# Patient Record
Sex: Female | Born: 1960 | Race: White | Hispanic: No | Smoking: Former smoker
Health system: Southern US, Community
[De-identification: ages and names within clinical notes are randomized; demographics above are authoritative.]

## PROBLEM LIST (undated history)

## (undated) DIAGNOSIS — J45909 Unspecified asthma, uncomplicated: Secondary | ICD-10-CM

## (undated) DIAGNOSIS — I10 Essential (primary) hypertension: Secondary | ICD-10-CM

## (undated) DIAGNOSIS — R7303 Prediabetes: Secondary | ICD-10-CM

## (undated) DIAGNOSIS — H269 Unspecified cataract: Secondary | ICD-10-CM

## (undated) DIAGNOSIS — E785 Hyperlipidemia, unspecified: Secondary | ICD-10-CM

## (undated) DIAGNOSIS — T7840XA Allergy, unspecified, initial encounter: Secondary | ICD-10-CM

## (undated) DIAGNOSIS — M1990 Unspecified osteoarthritis, unspecified site: Secondary | ICD-10-CM

## (undated) HISTORY — DX: Unspecified cataract: H26.9

## (undated) HISTORY — DX: Allergy, unspecified, initial encounter: T78.40XA

## (undated) HISTORY — PX: SMALL INTESTINE SURGERY: SHX150

## (undated) HISTORY — PX: TONSILLECTOMY: SUR1361

## (undated) HISTORY — PX: EYE SURGERY: SHX253

## (undated) HISTORY — DX: Unspecified osteoarthritis, unspecified site: M19.90

## (undated) HISTORY — PX: BREAST EXCISIONAL BIOPSY: SUR124

## (undated) HISTORY — PX: BREAST BIOPSY: SHX20

---
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
## 2000-07-03 HISTORY — PX: BREAST SURGERY: SHX581

## 2004-08-01 ENCOUNTER — Ambulatory Visit: Payer: Self-pay | Admitting: Family Medicine

## 2005-01-12 ENCOUNTER — Ambulatory Visit: Payer: Self-pay | Admitting: Family Medicine

## 2005-11-16 ENCOUNTER — Ambulatory Visit: Payer: Self-pay | Admitting: Family Medicine

## 2006-09-14 DIAGNOSIS — M25569 Pain in unspecified knee: Secondary | ICD-10-CM | POA: Insufficient documentation

## 2006-09-14 DIAGNOSIS — I10 Essential (primary) hypertension: Secondary | ICD-10-CM | POA: Insufficient documentation

## 2006-09-14 DIAGNOSIS — J45909 Unspecified asthma, uncomplicated: Secondary | ICD-10-CM | POA: Insufficient documentation

## 2007-10-02 ENCOUNTER — Ambulatory Visit: Payer: Self-pay | Admitting: Family Medicine

## 2008-06-19 DIAGNOSIS — B009 Herpesviral infection, unspecified: Secondary | ICD-10-CM | POA: Insufficient documentation

## 2008-12-09 DIAGNOSIS — F17201 Nicotine dependence, unspecified, in remission: Secondary | ICD-10-CM | POA: Insufficient documentation

## 2008-12-24 ENCOUNTER — Ambulatory Visit: Payer: Self-pay | Admitting: Family Medicine

## 2010-02-21 ENCOUNTER — Inpatient Hospital Stay: Payer: Self-pay | Admitting: Surgery

## 2010-02-21 HISTORY — PX: HERNIA REPAIR: SHX51

## 2010-02-23 LAB — PATHOLOGY REPORT

## 2010-03-18 ENCOUNTER — Ambulatory Visit: Payer: Self-pay | Admitting: Family Medicine

## 2010-07-22 ENCOUNTER — Inpatient Hospital Stay: Payer: Self-pay | Admitting: Surgery

## 2011-02-03 LAB — CBC AND DIFFERENTIAL
HEMATOCRIT: 44 % (ref 36–46)
HEMOGLOBIN: 15 g/dL (ref 12.0–16.0)
Platelets: 292 10*3/uL (ref 150–399)
WBC: 10.8 10^3/mL

## 2011-03-21 ENCOUNTER — Ambulatory Visit: Payer: Self-pay | Admitting: Family Medicine

## 2012-05-10 LAB — BASIC METABOLIC PANEL
BUN: 17 mg/dL (ref 4–21)
Potassium: 4.6 mmol/L (ref 3.4–5.3)
SODIUM: 142 mmol/L (ref 137–147)

## 2012-05-10 LAB — HEPATIC FUNCTION PANEL
ALT: 21 U/L (ref 7–35)
AST: 22 U/L (ref 13–35)

## 2012-05-10 LAB — TSH: TSH: 1.87 u[IU]/mL (ref 0.41–5.90)

## 2012-05-11 LAB — HM HEPATITIS C SCREENING LAB: HM HEPATITIS C SCREENING: NEGATIVE

## 2012-06-21 ENCOUNTER — Ambulatory Visit: Payer: Self-pay | Admitting: Family Medicine

## 2013-01-11 IMAGING — MG MM CAD SCREENING MAMMO
1 series · 4 of 4 positions shown · non-contrast
Comparison: none

REASON FOR EXAM: SCR MAMMO NO ORDER
COMMENTS:

PROCEDURE:     MAM - MAM DGTL SCRN MAM NO ORDER W/CAD  - June 21, 2012 [DATE]
RESULT:     Bilateral digital screening mammogram with CAD dated 06/21/2012
compares made to prior studies dated 07/15/2002  10/02/2007 12/24/2008 03/18/2010
and and 03/21/2011

[R CC · right · 4 of 4 slices shown]
[im 1/4]
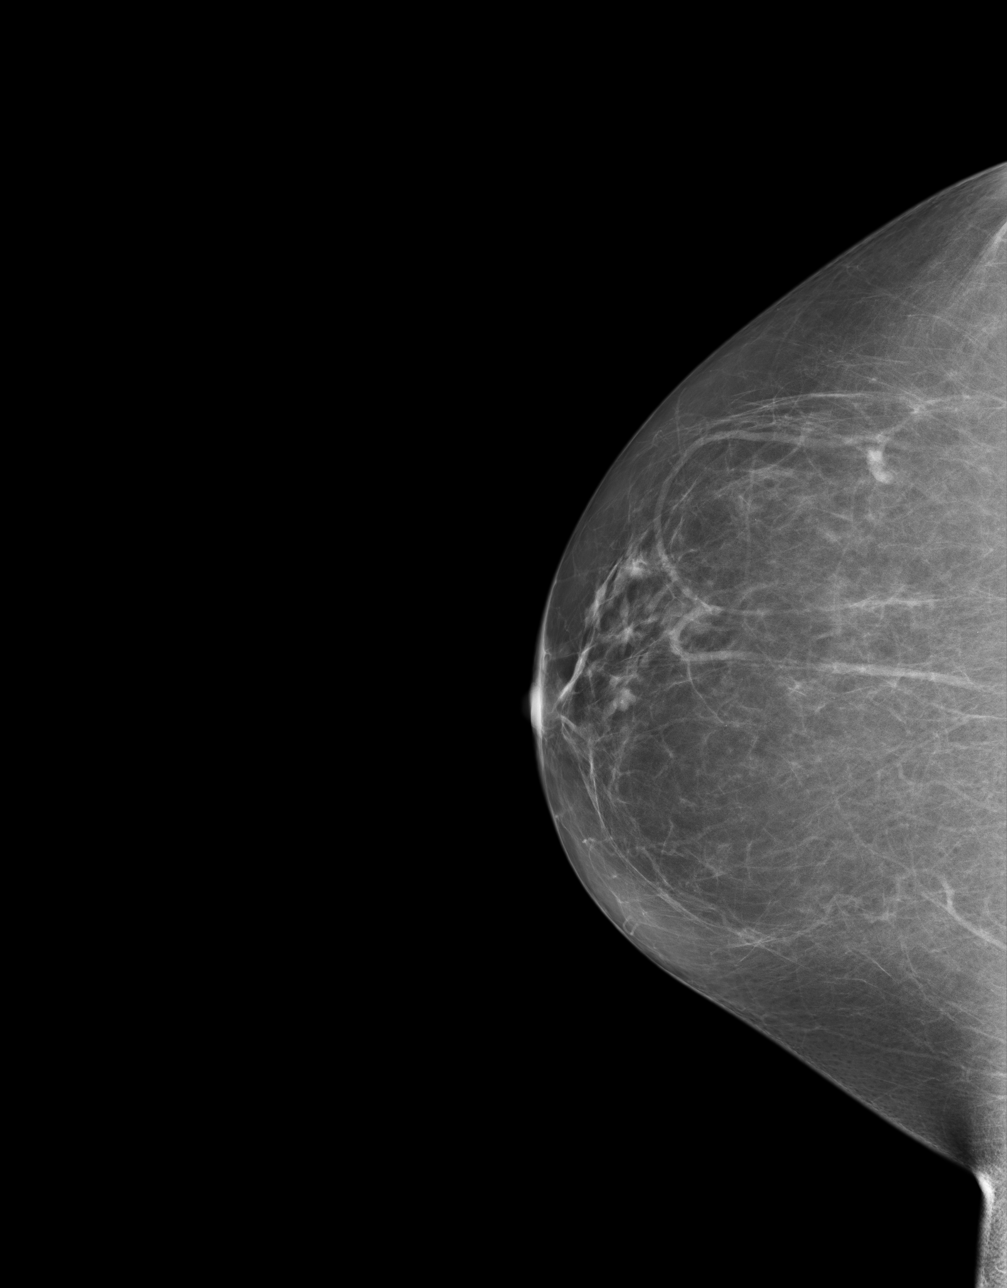
[im 2/4]
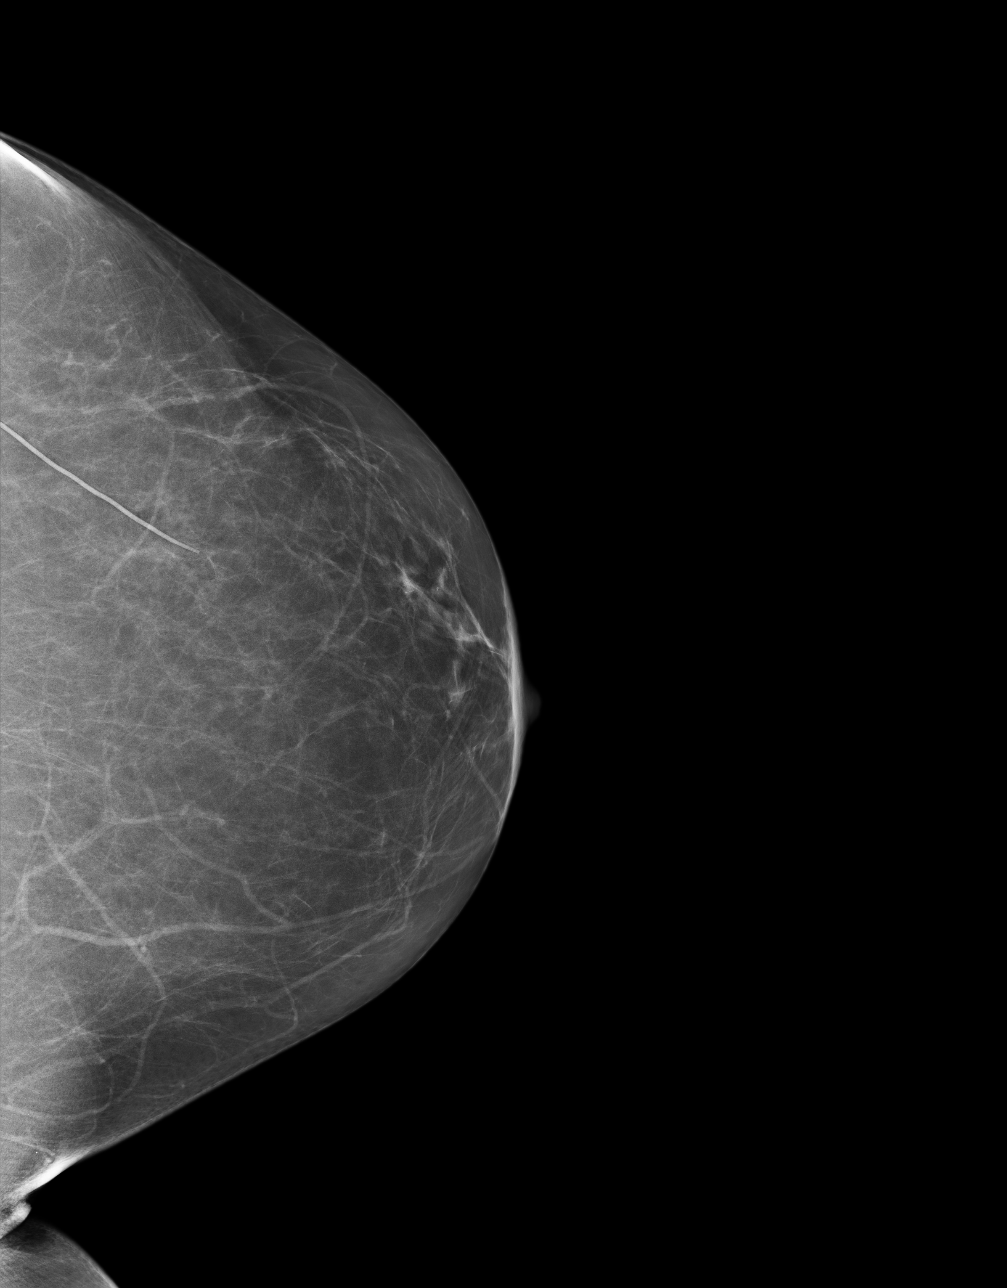
[im 3/4]
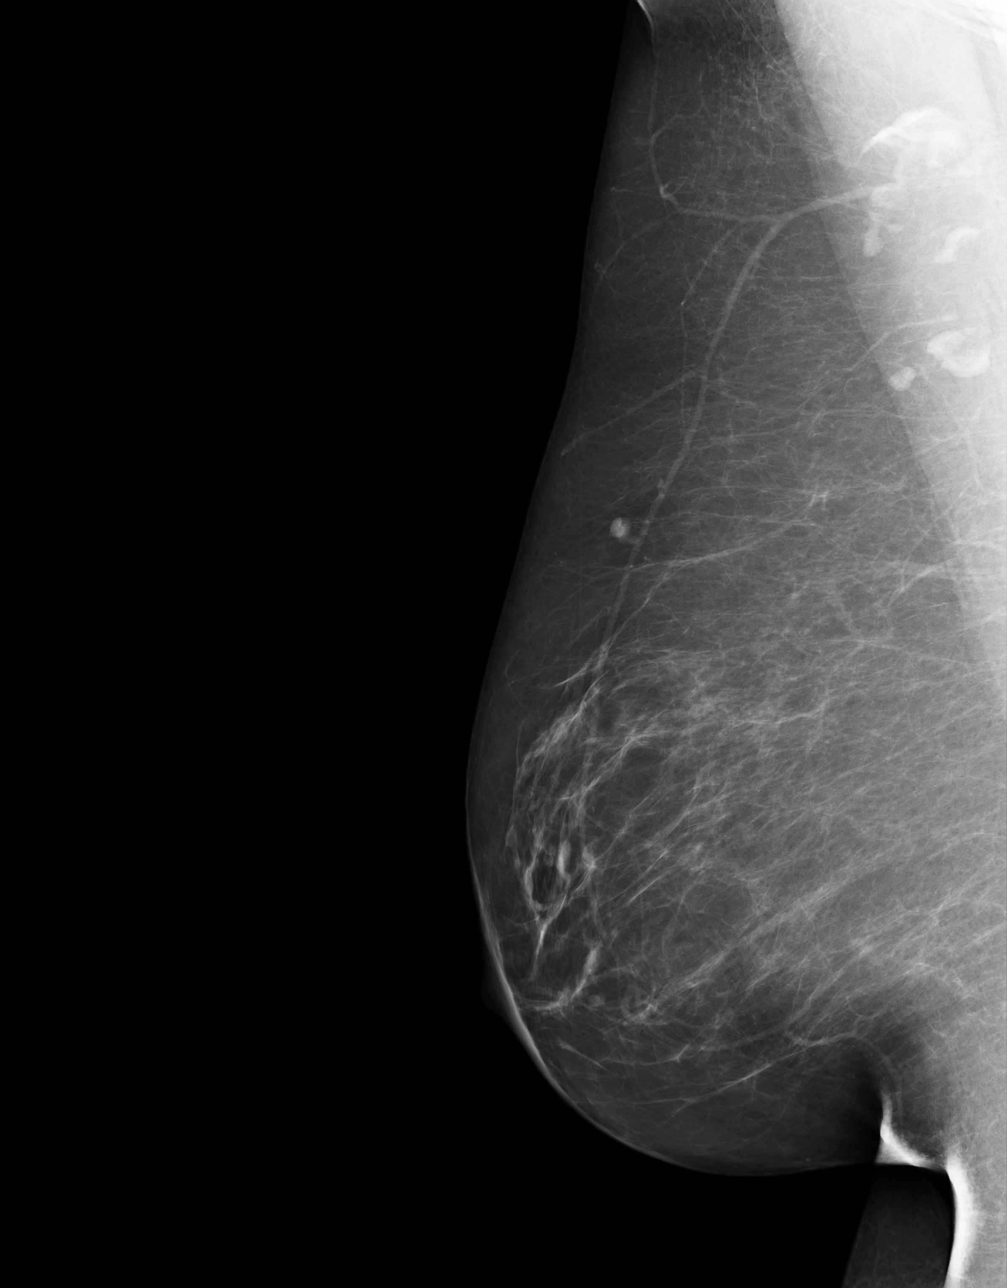
[im 4/4]
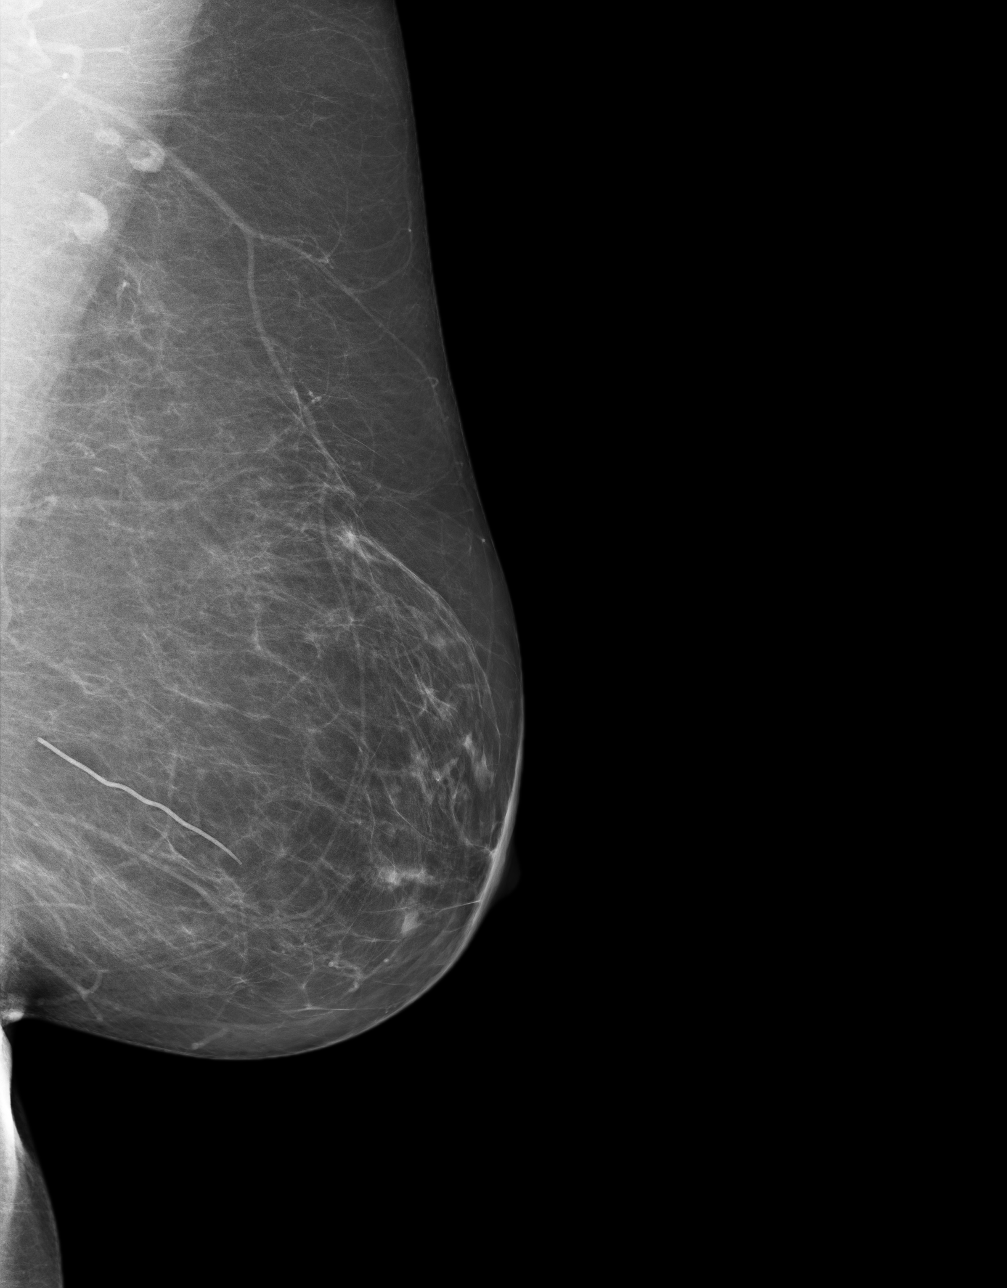

[4 of 4 positions shown; findings below may reference images not displayed]

FINDINGS: The breasts demonstrate scattered fibroglandular parenchymal
pattern there is no evidence of malignant type calcifications, architectural
distortion no suspicious masses or nodules.
IMPRESSION: BI-RADS: Category 2- Benign Finding

A NEGATIVE MAMMOGRAM REPORT DOES NOT PRECLUDE BIOPSY OR OTHER EVALUATION OF
A CLINICALLY PALPABLE OR OTHERWISE SUSPICIOUS MASS OR LESION. BREAST CANCER
MAY NOT BE DETECTED IN UP TO 10% OF CASES.

## 2013-07-01 ENCOUNTER — Ambulatory Visit: Payer: Self-pay | Admitting: Family Medicine

## 2013-08-29 ENCOUNTER — Ambulatory Visit: Payer: Self-pay | Admitting: Gastroenterology

## 2013-08-29 LAB — HM COLONOSCOPY

## 2014-03-01 LAB — LIPID PANEL
CHOLESTEROL: 217 mg/dL — AB (ref 0–200)
HDL: 75 mg/dL — AB (ref 35–70)
LDL Cholesterol: 117 mg/dL
LDl/HDL Ratio: 2.9
TRIGLYCERIDES: 123 mg/dL (ref 40–160)

## 2014-03-01 LAB — BASIC METABOLIC PANEL
CREATININE: 0.7 mg/dL (ref 0.5–1.1)
GLUCOSE: 94 mg/dL

## 2014-03-01 LAB — HEMOGLOBIN A1C: Hgb A1c MFr Bld: 5.8 % (ref 4.0–6.0)

## 2014-07-03 HISTORY — PX: CATARACT EXTRACTION: SUR2

## 2014-07-15 ENCOUNTER — Ambulatory Visit: Payer: Self-pay | Admitting: Family Medicine

## 2014-08-14 LAB — HM PAP SMEAR: HM Pap smear: NEGATIVE

## 2015-04-02 DIAGNOSIS — E559 Vitamin D deficiency, unspecified: Secondary | ICD-10-CM | POA: Insufficient documentation

## 2015-04-02 DIAGNOSIS — N912 Amenorrhea, unspecified: Secondary | ICD-10-CM | POA: Insufficient documentation

## 2015-04-02 DIAGNOSIS — R195 Other fecal abnormalities: Secondary | ICD-10-CM | POA: Insufficient documentation

## 2015-04-02 DIAGNOSIS — K1370 Unspecified lesions of oral mucosa: Secondary | ICD-10-CM | POA: Insufficient documentation

## 2015-04-02 DIAGNOSIS — Z6837 Body mass index (BMI) 37.0-37.9, adult: Secondary | ICD-10-CM | POA: Insufficient documentation

## 2015-04-02 DIAGNOSIS — L255 Unspecified contact dermatitis due to plants, except food: Secondary | ICD-10-CM | POA: Insufficient documentation

## 2015-04-02 DIAGNOSIS — E669 Obesity, unspecified: Secondary | ICD-10-CM | POA: Insufficient documentation

## 2015-04-02 DIAGNOSIS — N951 Menopausal and female climacteric states: Secondary | ICD-10-CM | POA: Insufficient documentation

## 2015-04-02 DIAGNOSIS — E7800 Pure hypercholesterolemia, unspecified: Secondary | ICD-10-CM | POA: Insufficient documentation

## 2015-04-02 DIAGNOSIS — L659 Nonscarring hair loss, unspecified: Secondary | ICD-10-CM | POA: Insufficient documentation

## 2015-04-02 DIAGNOSIS — Z7251 High risk heterosexual behavior: Secondary | ICD-10-CM | POA: Insufficient documentation

## 2015-04-02 DIAGNOSIS — E010 Iodine-deficiency related diffuse (endemic) goiter: Secondary | ICD-10-CM | POA: Insufficient documentation

## 2015-04-05 ENCOUNTER — Encounter: Payer: Self-pay | Admitting: Family Medicine

## 2015-04-05 ENCOUNTER — Ambulatory Visit (INDEPENDENT_AMBULATORY_CARE_PROVIDER_SITE_OTHER): Payer: 59 | Admitting: Family Medicine

## 2015-04-05 VITALS — BP 122/78 | HR 76 | Temp 98.2°F | Resp 16 | Ht 66.75 in | Wt 249.0 lb

## 2015-04-05 DIAGNOSIS — E669 Obesity, unspecified: Secondary | ICD-10-CM | POA: Diagnosis not present

## 2015-04-05 DIAGNOSIS — Z6839 Body mass index (BMI) 39.0-39.9, adult: Secondary | ICD-10-CM

## 2015-04-05 NOTE — Progress Notes (Addendum)
Subjective:    [name], female    DOB: 1961/01/13, 54 y.o.   MRN: [id number]  HPI  Obesity: Patient complains of obesity. Patient cites health as reasons for wanting to lose weight.  Obesity History Weight in late teens: 175 lb. Period of greatest weight gain: 100 lb during early adult years (during first marriage) Lowest adult weight: 175 Highest adult weight: 320 Amount of time at present weight: 3 months  History of Weight Loss Efforts Greatest amount of weight lost: 130 lb over 24 months Amount of time that loss was maintained: 6-12 months Circumstances associated with regain of weight: changed priorities. Successful weight loss techniques attempted: Weight Watchers Unsuccessful weight loss techniques attempted: self-directed dieting and pt reports it is easy to lose weight once she puts her mind to it. Has done Weight [employer] successfully in the past.    Current Exercise Habits walking  Plans to increase walking.  Current Eating Habits Number of regular meals per day: 3 Number of snacking episodes per day: 2 Who shops for food? patient Who prepares food? patient Who eats with patient? patient Binge behavior?: no Purge behavior? no Anorexic behavior? no Eating precipitated by stress? yes  Guilt feelings associated with eating? yes - with stress eating  Had gained some weight back last year and managed to get it back off. Plans to continue this pattern.  Did not return to highest weight which was over 300 pounds.   Review of Systems  Constitutional: Negative for fever, chills, diaphoresis, activity change, appetite change, fatigue and unexpected weight change.  Respiratory: Negative for cough, shortness of breath and wheezing.   Cardiovascular: Negative for chest pain, palpitations and leg swelling.   BP 122/78 mmHg  Pulse 76  Temp(Src) 98.2 F (36.8 C) (Oral)  Resp 16  Ht 5' 6.75" (1.695 m)  Wt 249 lb (112.946 kg)  BMI 39.31 kg/m2    Patient Active Problem List   Diagnosis Date Noted  . Absence of menstruation 04/02/2015  . Body mass index 34.0-34.9, adult 04/02/2015  . Hypercholesteremia 04/02/2015  . Big thyroid 04/02/2015  . Fecal occult blood test positive 04/02/2015  . Alopecia 04/02/2015  . High risk sexual behavior 04/02/2015  . Symptomatic menopausal or female climacteric states 04/02/2015  . Contact dermatitis due to plants, except food 04/02/2015  . Disease of the oral soft tissues 04/02/2015  . Avitaminosis D 04/02/2015  . Contact with and suspected exposure to infections with predominantly sexual mode of transmission 01/27/2010  . H/O contraceptive use 01/27/2010  . Tobacco use 12/09/2008  . Herpes 06/19/2008  . Adiposity 09/19/2006  . Asthma 09/14/2006  . Accelerated essential hypertension 09/14/2006  . Arthralgia of lower leg 09/14/2006  . 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 Current Outpatient Prescriptions on File Prior to Visit  Medication Sig  . acyclovir (ZOVIRAX) 400 MG tablet Take by mouth.  Marland Kitchen albuterol (PROAIR HFA) 108 (90 BASE) MCG/ACT inhaler Inhale into the lungs. 2 puffs every 6 hours as needed  . MULTIPLE VITAMIN PO   . naproxen sodium (ANAPROX) 220 MG tablet [facility] NAPROXEN SODIUM, 220MG  (Oral Tablet)  2 every morning and 1 every night for 0 days  Quantity: 0.00;  Refills: 0   Ordered :15-Jun-2010  Edmonia James ;  Started 19-Sep-2006 Active  . OMEGA-3 FATTY ACIDS PO Take 2 capsules by mouth daily.  Marland Kitchen penciclovir (DENAVIR) 1 % cream DENAVIR, 1% (External Cream)  1 Cream apply every 2 hrs  while awake for 4 days prn for 0 days  Quantity: 1.5;  Refills: 5   Ordered :14-Aug-2013  Margarita Rana MD;  Started 14-Aug-2013 Active   No current facility-administered medications on file prior to visit.   Allergies  Allergen Reactions  . Penicillins    No past surgical history on file. Social History   Social History  . Marital  Status: Divorced    Spouse Name: N/A  . Number of Children: N/A  . Years of Education: N/A   Occupational History  . Not on file.   Social History Main Topics  . Smoking status: Former Smoker -- 5 years    Quit date: 07/03/1999  . Smokeless tobacco: Never Used  . Alcohol Use: No  . Drug Use: No  . Sexual Activity: Not on file   Other Topics Concern  . Not on file   Social History Narrative  . No narrative on file   Family History  Problem Relation Age of Onset  . Crohn's disease Mother   . Thyroid disease Mother   . Rheum arthritis Mother   . Hypertension Father        Objective:   Physical Exam  Constitutional: She is oriented to person, place, and time. She appears well-developed and well-nourished.  Cardiovascular: Normal rate and regular rhythm.   Pulmonary/Chest: Effort normal and breath sounds normal.  Neurological: She is alert and oriented to person, place, and time.  Psychiatric: She has a normal mood and affect. Her behavior is normal. Judgment and thought content normal.   BP 122/78 mmHg  Pulse 76  Temp(Src) 98.2 F (36.8 C) (Oral)  Resp 16  Ht 5' 6.75" (1.695 m)  Wt 249 lb (112.946 kg)  BMI 39.31 kg/m2     Assessment & Plan:  1. Body mass index (BMI) of 39.0 to 39.9 in adult Stable from last year. Did gain weight in has lost down to current baseline.  Did not return to maximum weight which was well over 300 pounds. Lost about 10 percent of her body weight in past year. Very motivated to continue weight loss.  Active member of [employer] and has started exercising again. Patient has NO sequela of obesity, i.e. No hypertension, hyperlipidemia or diabetes.  Spent greater than 20 minutes in face to face counseling regarding lifestyle.     2. Adiposity As above. Motivated to continue to loose weight.   Margarita Rana, MD

## 2015-04-21 ENCOUNTER — Other Ambulatory Visit: Payer: Self-pay | Admitting: Family Medicine

## 2015-04-21 DIAGNOSIS — Z1231 Encounter for screening mammogram for malignant neoplasm of breast: Secondary | ICD-10-CM

## 2015-05-07 ENCOUNTER — Other Ambulatory Visit: Payer: Self-pay | Admitting: Family Medicine

## 2015-05-07 ENCOUNTER — Other Ambulatory Visit: Payer: Self-pay

## 2015-05-07 DIAGNOSIS — B009 Herpesviral infection, unspecified: Secondary | ICD-10-CM

## 2015-05-07 MED ORDER — ACYCLOVIR 400 MG PO TABS: ORAL_TABLET | ORAL | Status: DC

## 2015-07-19 ENCOUNTER — Ambulatory Visit
Admission: RE | Admit: 2015-07-19 | Discharge: 2015-07-19 | Disposition: A | Discharge status: Home / Self Care | Payer: 59 | Source: Ambulatory Visit | Attending: Family Medicine | Admitting: Family Medicine

## 2015-07-19 DIAGNOSIS — Z1231 Encounter for screening mammogram for malignant neoplasm of breast: Secondary | ICD-10-CM

## 2015-08-20 ENCOUNTER — Ambulatory Visit (INDEPENDENT_AMBULATORY_CARE_PROVIDER_SITE_OTHER): Payer: 59 | Admitting: Family Medicine

## 2015-08-20 ENCOUNTER — Encounter: Payer: Self-pay | Admitting: Family Medicine

## 2015-08-20 VITALS — BP 158/102 | HR 76 | Temp 98.2°F | Resp 16 | Ht 67.0 in | Wt 265.0 lb

## 2015-08-20 DIAGNOSIS — Z0189 Encounter for other specified special examinations: Secondary | ICD-10-CM

## 2015-08-20 DIAGNOSIS — E7800 Pure hypercholesterolemia, unspecified: Secondary | ICD-10-CM | POA: Diagnosis not present

## 2015-08-20 DIAGNOSIS — Z6841 Body Mass Index (BMI) 40.0 and over, adult: Secondary | ICD-10-CM | POA: Diagnosis not present

## 2015-08-20 DIAGNOSIS — Z124 Encounter for screening for malignant neoplasm of cervix: Secondary | ICD-10-CM

## 2015-08-20 DIAGNOSIS — Z0000 Encounter for general adult medical examination without abnormal findings: Secondary | ICD-10-CM | POA: Diagnosis not present

## 2015-08-20 DIAGNOSIS — Z114 Encounter for screening for human immunodeficiency virus [HIV]: Secondary | ICD-10-CM

## 2015-08-20 LAB — POCT URINALYSIS DIPSTICK
Bilirubin, UA: NEGATIVE
Blood, UA: NEGATIVE
Glucose, UA: NEGATIVE
Ketones, UA: NEGATIVE
Leukocytes, UA: NEGATIVE
Nitrite, UA: NEGATIVE
Protein, UA: NEGATIVE
Spec Grav, UA: 1.015
Urobilinogen, UA: 0.2
pH, UA: 6.5

## 2015-08-20 NOTE — Progress Notes (Signed)
Patient ID: TASHEBA SELLARDS, female   DOB: [birth year], 55 y.o.   MRN: [id number]       Patient: Rita Foster, Female    DOB: 03-10-1961, 55 y.o.   MRN: [id number] Visit Date: 08/20/2015  Today's Provider: Margarita Rana, MD   Chief Complaint  Patient presents with  . Annual Exam   Subjective:    Annual physical exam NATUSHA ALLISTON is a 55 y.o. female who presents today for health maintenance and complete physical. She feels well. She reports exercising 3 days a week. She reports she is sleeping well. 08/14/14 CPE 08/14/14 Pap-neg; HPV-neg 07/19/15 Mammo-BI-RADS 1 08/29/13 Colon-normal, recheck in 10 yrs  -----------------------------------------------------------------   Review of Systems  Constitutional: Negative.   HENT: Negative.   Eyes: Negative.   Respiratory: Negative.   Cardiovascular: Negative.   Gastrointestinal: Negative.   Endocrine: Negative.   Genitourinary: Negative.   Musculoskeletal: Positive for arthralgias.  Skin: Negative.   Allergic/Immunologic: Negative.   Neurological: Negative.   Hematological: Negative.   Psychiatric/Behavioral: Negative.     Social History      She  reports that she quit smoking about 16 years ago. She has never used smokeless tobacco. She reports that she does not drink alcohol or use illicit drugs.       Social History   Social History  . Marital Status: Divorced    Spouse Name: N/A  . Number of Children: N/A  . Years of Education: N/A   Social History Main Topics  . Smoking status: Former Smoker -- 5 years    Quit date: 07/03/1999  . Smokeless tobacco: Never Used  . Alcohol Use: No  . Drug Use: No  . Sexual Activity: Not Asked   Other Topics Concern  . None   Social History Narrative    History reviewed. No pertinent past medical history.   Patient Active Problem List   Diagnosis Date Noted  . Absence of menstruation 04/02/2015  . Body mass index (BMI) of 39.0 to 39.9 in adult 04/02/2015    . Hypercholesteremia 04/02/2015  . Symptomatic menopausal or female climacteric states 04/02/2015  . 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 Herpes 06/19/2008  . Adiposity 09/19/2006  . Asthma 09/14/2006  . Arthralgia of lower leg 09/14/2006    Past Surgical History  Procedure Laterality Date  . Hernia repair  02/21/2010  . Breast surgery  2002    biopsy  . Cataract extraction Right 07/2014  . Tonsillectomy and adenoidectomy  1968  . Breast biopsy Left 10+ years ago    Negative    Family History        Family Status  Relation Status Death Age  . Mother Alive   . Father Alive   . Maternal Grandmother Deceased 24    natural causes  . Maternal Grandfather Deceased     artherosclerosis  . Paternal 16 Deceased     old age  . Paternal Grandfather Deceased     unknown causes        Her family history includes Breast cancer in her paternal aunt; Breast cancer (age of onset: 46) in her mother; Crohn's disease in her mother; Hypertension in her father; Rheum arthritis in her mother; Skin cancer in her father; Thyroid disease in her mother.    Allergies  Allergen Reactions  . Penicillins     Previous Medications   ACYCLOVIR (ZOVIRAX) 400 MG TABLET    Take 1 tablet by mouth two  times  daily   ALBUTEROL (PROAIR HFA) 108 (90 BASE) MCG/ACT INHALER    Inhale into the lungs. 2 puffs every 6 hours as needed   MAGNESIUM PO    Take 1 tablet by mouth daily.    MISC NATURAL PRODUCTS (OSTEO BI-FLEX JOINT SHIELD PO)    Take 1 tablet by mouth daily.    MULTIPLE VITAMIN PO    Take 1 tablet by mouth daily.    NAPROXEN SODIUM (ANAPROX) 220 MG TABLET    [facility] NAPROXEN SODIUM, 220MG  (Oral Tablet)  2 every morning and 1 every night for 0 days  Quantity: 0.00;  Refills: 0   Ordered :15-Jun-2010  Edmonia James ;  Started 19-Sep-2006 Active   OMEGA-3 FATTY ACIDS PO    Take 2 capsules by mouth daily.   PENCICLOVIR (DENAVIR) 1 % CREAM    DENAVIR, 1%  (External Cream)  1 Cream apply every 2 hrs while awake for 4 days prn for 0 days  Quantity: 1.5;  Refills: 5   Ordered :14-Aug-2013  Margarita Rana MD;  Started 14-Aug-2013 Active    Patient Care Team: Margarita Rana, MD as PCP - General (Family Medicine)     Objective:   Vitals: BP 158/102 mmHg  Pulse 76  Temp(Src) 98.2 F (36.8 C) (Oral)  Resp 16  Ht 5\' 7"  (1.702 m)  Wt 265 lb (120.203 kg)  BMI 41.50 kg/m2  SpO2 97%   Physical Exam  Constitutional: She is oriented to person, place, and time. She appears well-developed and well-nourished.  HENT:  Head: Normocephalic and atraumatic.  Right Ear: Tympanic membrane, external ear and ear canal normal.  Left Ear: Tympanic membrane, external ear and ear canal normal.  Nose: Nose normal.  Mouth/Throat: Uvula is midline, oropharynx is clear and moist and mucous membranes are normal.  Eyes: Conjunctivae, EOM and lids are normal. Pupils are equal, round, and reactive to light.  Neck: Trachea normal and normal range of motion. Neck supple. Carotid bruit is not present. No thyroid mass and no thyromegaly present.  Cardiovascular: Normal rate, regular rhythm and normal heart sounds.   Pulmonary/Chest: Effort normal and breath sounds normal.  Abdominal: Soft. Normal appearance and bowel sounds are normal. There is no hepatosplenomegaly. There is no tenderness.  Genitourinary: Vagina normal. No breast swelling, tenderness or discharge.  Musculoskeletal: Normal range of motion.  Lymphadenopathy:    She has no cervical adenopathy.    She has no axillary adenopathy.  Neurological: She is alert and oriented to person, place, and time. She has normal strength. No cranial nerve deficit.  Skin: Skin is warm, dry and intact.  Psychiatric: She has a normal mood and affect. Her speech is normal and behavior is normal. Judgment and thought content normal. Cognition and memory are normal.     Depression Screen PHQ 2/9 Scores 08/20/2015    Exception Documentation Patient refusal      Assessment & Plan:     Routine Health Maintenance and Physical Exam  Exercise Activities and Dietary recommendations Goals    None      Immunization History  Administered Date(s) Administered  . Pneumococcal Polysaccharide-23 05/03/2005  . Td 08/04/2002  . Tdap 01/27/2010     1. Annual physical exam Stable. Patient advised to continue eating healthy and exercise daily. - POCT urinalysis dipstick - CBC with Differential/Platelet - Comprehensive metabolic panel - Hemoglobin A1c  2. Hypercholesteremia - Lipid Panel With LDL/HDL Ratio - TSH  3. Cervical cancer screening - Pap IG and HPV (high risk)  DNA detection  4. Encounter for HIV (human immunodeficiency virus) test - HIV antibody (with reflex)  5. BMI 40.0-44.9, adult ([place]) Going to work on diet and exercise. Has been successful with  Weight Wat[employer] in the past.      Patient seen and examined by Dr. Jerrell Belfast, and note scribed by Philbert Riser. Dimas, CMA.  I have reviewed the document for accuracy and completeness and I agree with above. Jerrell Belfast, MD   Margarita Rana, MD     --------------------------------------------------------------------

## 2015-08-21 LAB — COMPREHENSIVE METABOLIC PANEL
ALT: 18 IU/L (ref 0–32)
AST: 22 IU/L (ref 0–40)
Albumin/Globulin Ratio: 1.7 (ref 1.1–2.5)
Albumin: 4.3 g/dL (ref 3.5–5.5)
Alkaline Phosphatase: 75 IU/L (ref 39–117)
BUN/Creatinine Ratio: 32 — ABNORMAL HIGH (ref 9–23)
BUN: 20 mg/dL (ref 6–24)
Bilirubin Total: 0.2 mg/dL (ref 0.0–1.2)
CALCIUM: 9.5 mg/dL (ref 8.7–10.2)
CHLORIDE: 102 mmol/L (ref 96–106)
CO2: 25 mmol/L (ref 18–29)
CREATININE: 0.62 mg/dL (ref 0.57–1.00)
GFR, EST AFRICAN AMERICAN: 117 mL/min/{1.73_m2} (ref >59)
GFR, EST NON AFRICAN AMERICAN: 102 mL/min/{1.73_m2} (ref >59)
GLUCOSE: 99 mg/dL (ref 65–99)
Globulin, Total: 2.6 g/dL (ref 1.5–4.5)
Potassium: 4.7 mmol/L (ref 3.5–5.2)
Sodium: 140 mmol/L (ref 134–144)
TOTAL PROTEIN: 6.9 g/dL (ref 6.0–8.5)

## 2015-08-21 LAB — CBC WITH DIFFERENTIAL/PLATELET
BASOS: 0 %
Basophils Absolute: 0 10*3/uL (ref 0.0–0.2)
EOS (ABSOLUTE): 0.3 10*3/uL (ref 0.0–0.4)
EOS: 3 %
HEMOGLOBIN: 13.1 g/dL (ref 11.1–15.9)
Hematocrit: 40.1 % (ref 34.0–46.6)
IMMATURE GRANS (ABS): 0 10*3/uL (ref 0.0–0.1)
IMMATURE GRANULOCYTES: 0 %
Lymphocytes Absolute: 2.1 10*3/uL (ref 0.7–3.1)
Lymphs: 28 %
MCH: 28.1 pg (ref 26.6–33.0)
MCHC: 32.7 g/dL (ref 31.5–35.7)
MCV: 86 fL (ref 79–97)
MONOS ABS: 0.6 10*3/uL (ref 0.1–0.9)
Monocytes: 8 %
NEUTROS ABS: 4.6 10*3/uL (ref 1.4–7.0)
NEUTROS PCT: 61 %
PLATELETS: 371 10*3/uL (ref 150–379)
RBC: 4.67 x10E6/uL (ref 3.77–5.28)
RDW: 14.1 % (ref 12.3–15.4)
WBC: 7.6 10*3/uL (ref 3.4–10.8)

## 2015-08-21 LAB — HEMOGLOBIN A1C
Est. average glucose Bld gHb Est-mCnc: 117 mg/dL
Hgb A1c MFr Bld: 5.7 % — ABNORMAL HIGH (ref 4.8–5.6)

## 2015-08-21 LAB — LIPID PANEL WITH LDL/HDL RATIO
Cholesterol, Total: 203 mg/dL — ABNORMAL HIGH (ref 100–199)
HDL: 60 mg/dL (ref >39)
LDL CALC: 131 mg/dL — AB (ref 0–99)
LDL/HDL RATIO: 2.2 ratio (ref 0.0–3.2)
Triglycerides: 60 mg/dL (ref 0–149)
VLDL CHOLESTEROL CAL: 12 mg/dL (ref 5–40)

## 2015-08-21 LAB — TSH: TSH: 1.84 u[IU]/mL (ref 0.450–4.500)

## 2015-08-21 LAB — HIV ANTIBODY (ROUTINE TESTING W REFLEX): HIV Screen 4th Generation wRfx: NONREACTIVE

## 2015-08-23 ENCOUNTER — Telehealth: Payer: Self-pay

## 2015-08-23 NOTE — Telephone Encounter (Signed)
Advised pt of lab results. Pt verbally acknowledges understanding. Emily Drozdowski, CMA   

## 2015-08-23 NOTE — Telephone Encounter (Signed)
-----   Message from Margarita Rana, MD sent at 08/21/2015 10:51 AM EST ----- Labs stable. BLood sugar at upper limits of normal.  Please notify patient. Continue to eat healthy and exercise and check annually. Thanks.

## 2015-08-27 ENCOUNTER — Telehealth: Payer: Self-pay

## 2015-08-27 LAB — PAP IG AND HPV HIGH-RISK
HPV, high-risk: NEGATIVE
PAP Smear Comment: 0

## 2015-08-27 NOTE — Telephone Encounter (Signed)
Tried calling; pt's voicemail box is full.   Thanks,   -Bradyn Vassey  

## 2015-08-27 NOTE — Telephone Encounter (Signed)
-----   Message from Margarita Rana, MD sent at 08/27/2015  6:37 AM EST ----- Pap is normal. Please notify patient.   Thanks.

## 2015-08-27 NOTE — Telephone Encounter (Signed)
Pt advised.   Thanks,   -Laura  

## 2016-02-08 IMAGING — MG MM DIGITAL SCREENING BILAT W/ CAD
4 series · 4 of 4 positions shown · non-contrast
Comparison: Previous exam(s).

CLINICAL DATA: Screening.

EXAM:
DIGITAL SCREENING BILATERAL MAMMOGRAM WITH CAD

[L CC]
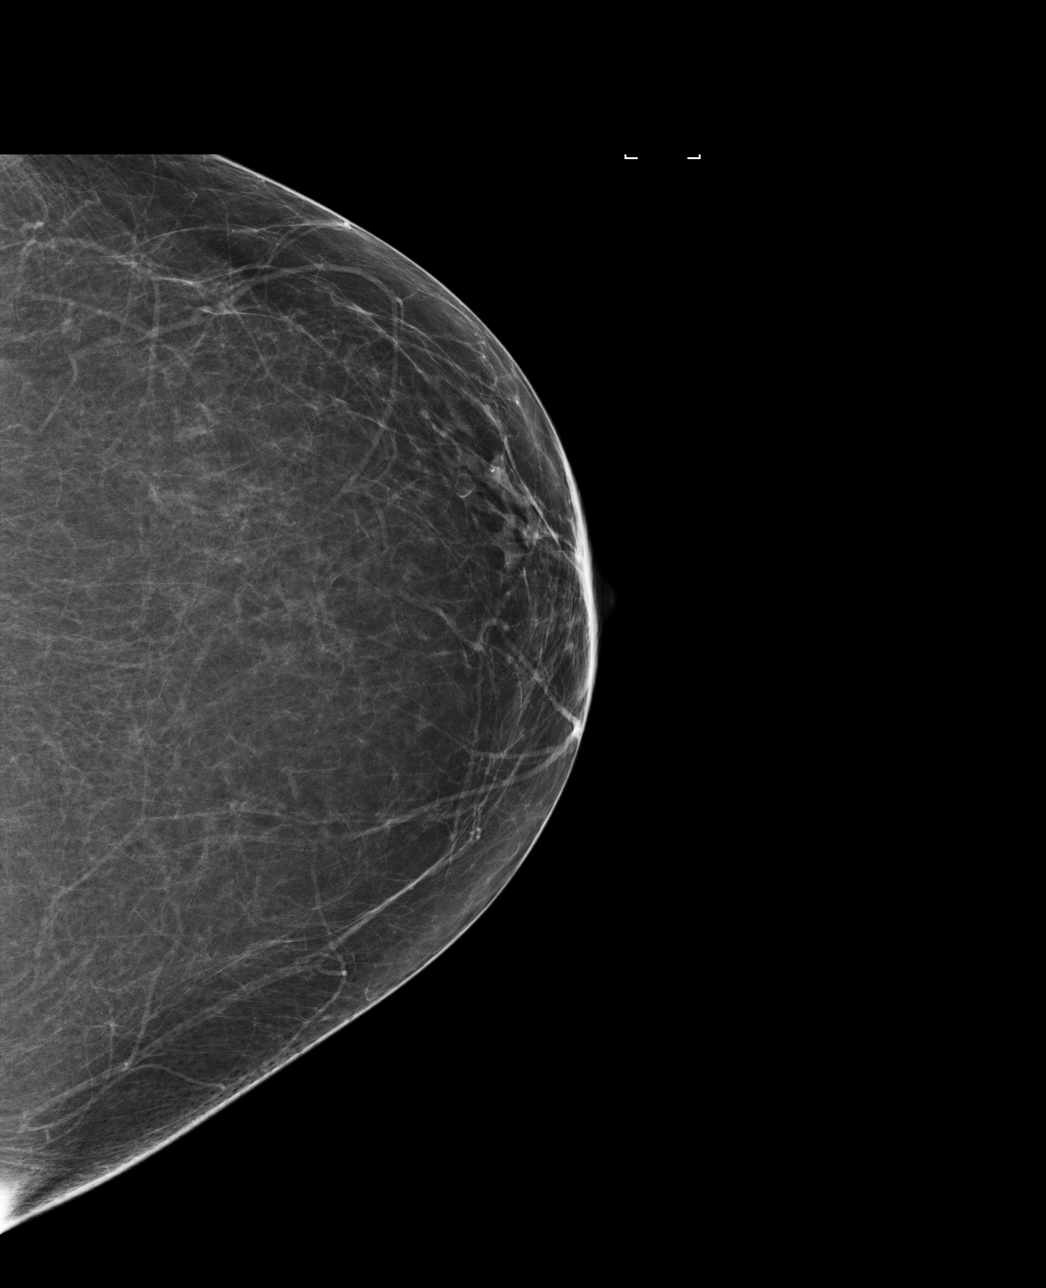

[R MLO]
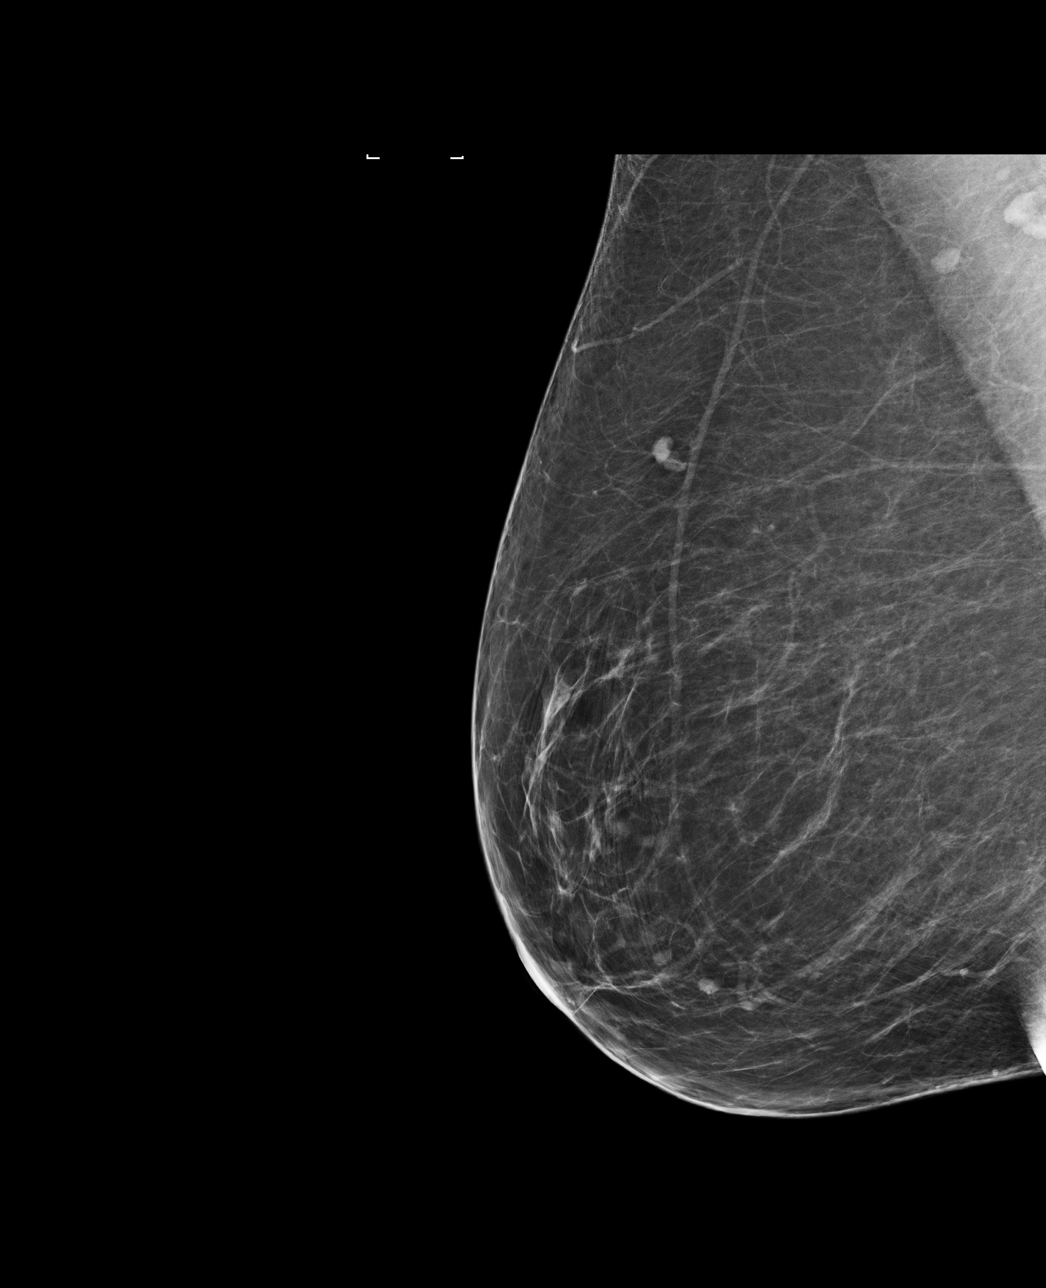

[R CC]
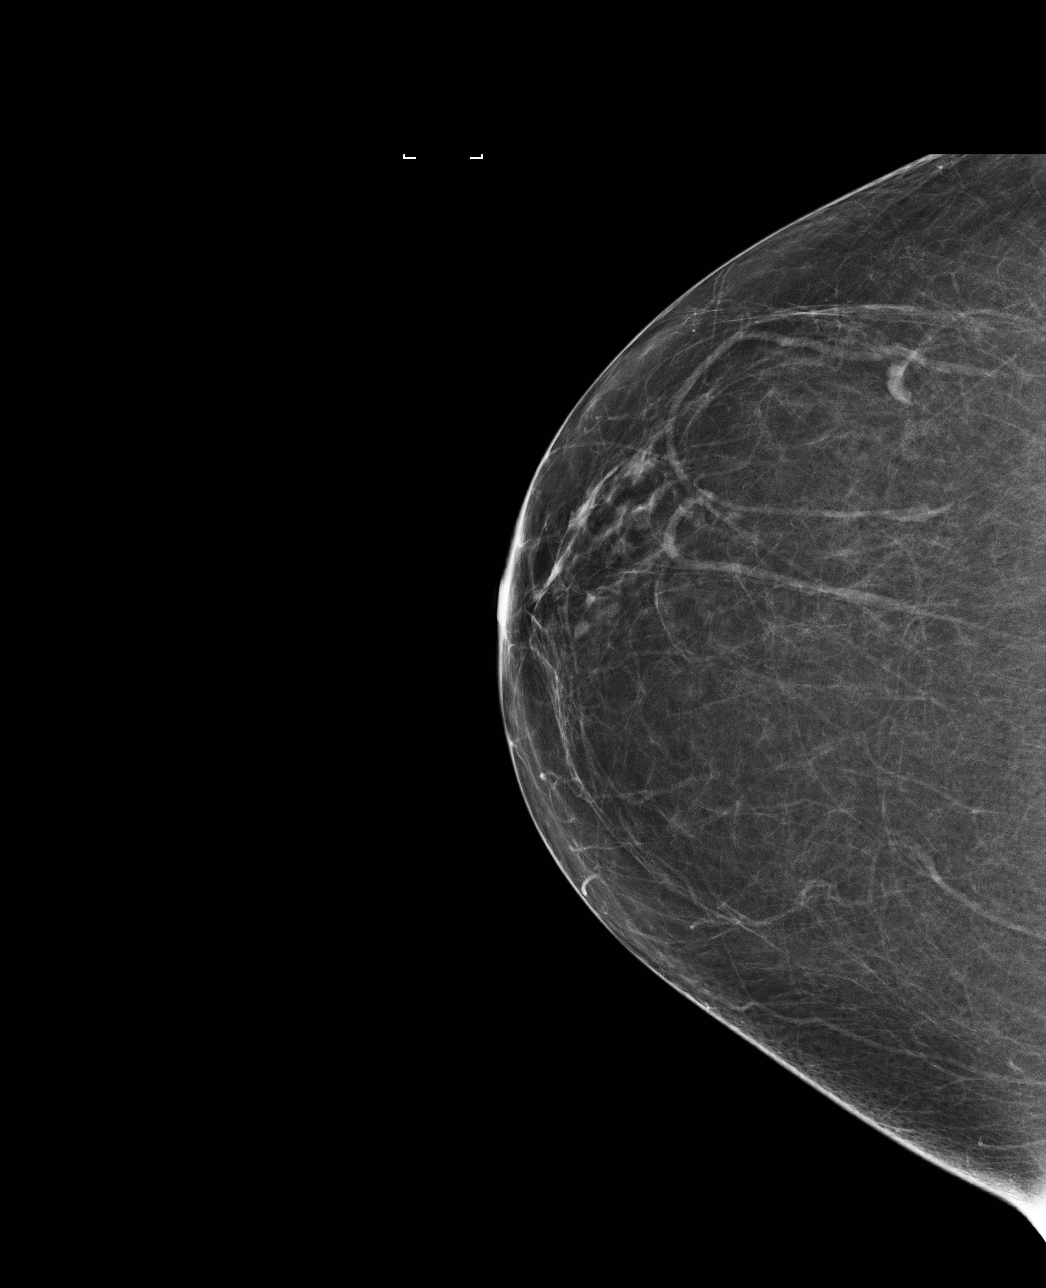

[L MLO]
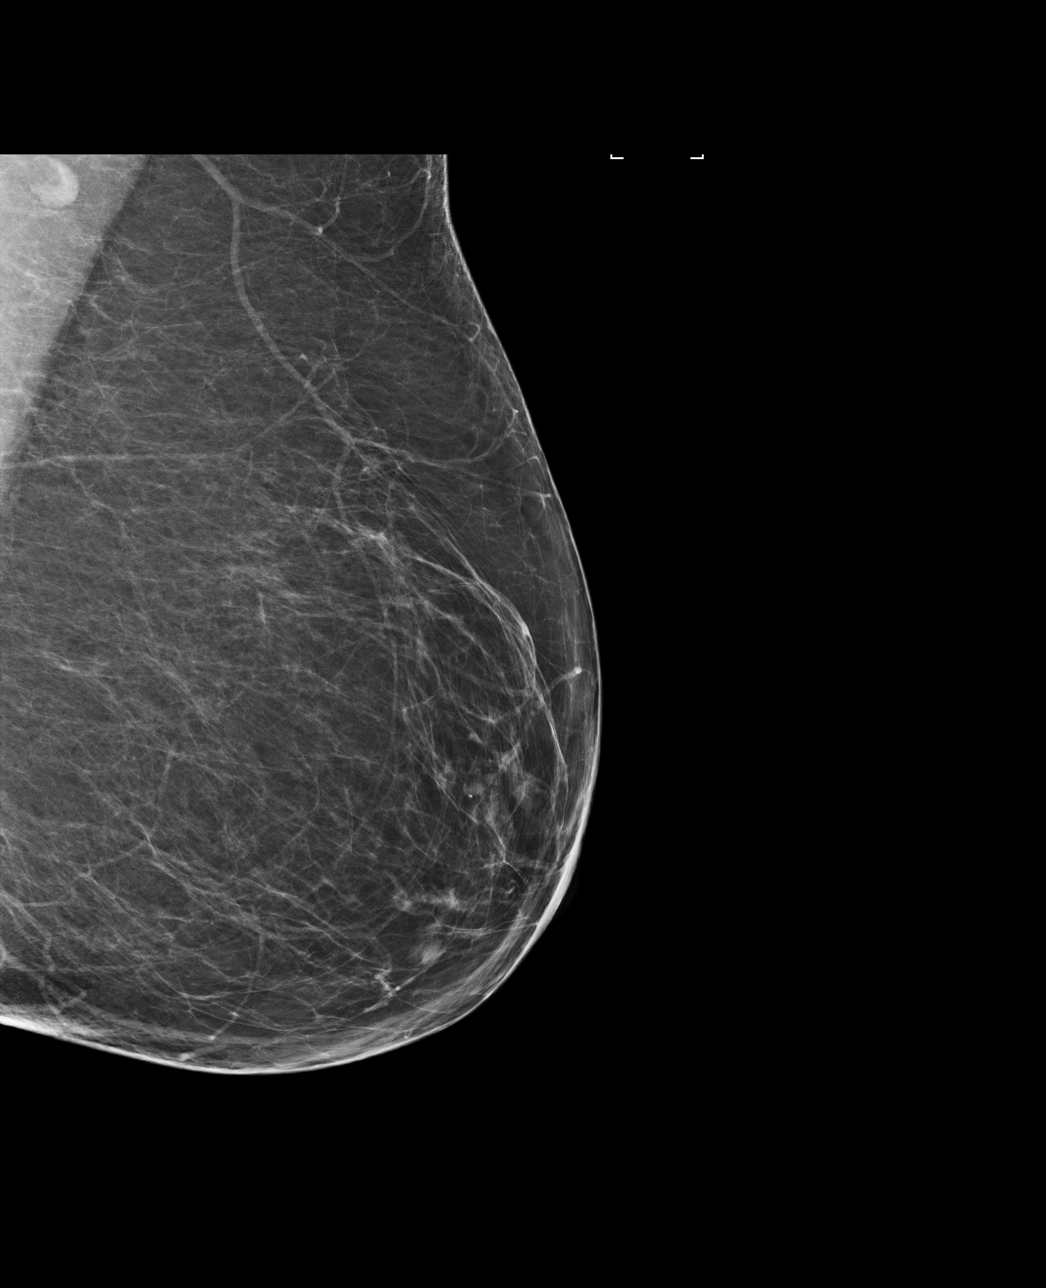

[4 of 4 positions shown; findings below may reference images not displayed]

ACR Breast Density Category b: There are scattered areas of
fibroglandular density.
FINDINGS: There are no findings suspicious for malignancy. Images were
processed with CAD.
IMPRESSION: No mammographic evidence of malignancy. A result letter of this
screening mammogram will be mailed directly to the patient.

RECOMMENDATION:
Screening mammogram in one year. (Code:AS-G-LCT)

BI-RADS CATEGORY  1: Negative.

## 2016-04-18 ENCOUNTER — Ambulatory Visit (INDEPENDENT_AMBULATORY_CARE_PROVIDER_SITE_OTHER): Payer: 59 | Admitting: Physician Assistant

## 2016-04-18 ENCOUNTER — Encounter: Payer: Self-pay | Admitting: Physician Assistant

## 2016-04-18 VITALS — BP 160/100 | HR 72 | Temp 98.8°F | Resp 16 | Ht 67.0 in | Wt 237.0 lb

## 2016-04-18 DIAGNOSIS — Z6837 Body mass index (BMI) 37.0-37.9, adult: Secondary | ICD-10-CM | POA: Diagnosis not present

## 2016-04-18 DIAGNOSIS — Z713 Dietary counseling and surveillance: Secondary | ICD-10-CM | POA: Diagnosis not present

## 2016-04-18 DIAGNOSIS — E6609 Other obesity due to excess calories: Secondary | ICD-10-CM | POA: Diagnosis not present

## 2016-04-18 NOTE — Patient Instructions (Signed)

## 2016-04-18 NOTE — Progress Notes (Signed)
Patient: Rita Foster Female    DOB: 08-26-1960   55 y.o.   MRN: [id number] Visit Date: 04/18/2016  Today's Provider: Mar Daring, PA-C   Chief Complaint  Patient presents with  . Follow-up   Subjective:    HPI  Follow up for weight loss  The patient was last seen for this 8 months ago. Changes made at last visit include no changes made.  She reports excellent compliance with treatment. She feels that condition is Improved. She is not having side effects.   She is doing weight watchers and exercising. She has gotten as low as 170 pounds in her adult life. She has been as heavy as over 300 pounds. She has lost over 30 pounds in 6 months. ------------------------------------------------------------------------------------       Allergies  Allergen Reactions  . Penicillins      Current Outpatient Prescriptions:  .  acetaminophen (TYLENOL ARTHRITIS PAIN) 650 MG CR tablet, Take 650 mg by mouth every 8 (eight) hours as needed for pain., Disp: , Rfl:  .  acyclovir (ZOVIRAX) 400 MG tablet, Take 1 tablet by mouth two  times daily, Disp: 180 tablet, Rfl: 1 .  albuterol (PROAIR HFA) 108 (90 BASE) MCG/ACT inhaler, Inhale into the lungs. 2 puffs every 6 hours as needed, Disp: , Rfl:  .  MAGNESIUM PO, Take 1 tablet by mouth daily. , Disp: , Rfl:  .  Misc Natural Products (OSTEO BI-FLEX JOINT SHIELD PO), Take 1 tablet by mouth daily. , Disp: , Rfl:  .  MULTIPLE VITAMIN PO, Take 1 tablet by mouth daily. , Disp: , Rfl:  .  OMEGA-3 FATTY ACIDS PO, Take 2 capsules by mouth daily., Disp: , Rfl:  .  penciclovir (DENAVIR) 1 % cream, DENAVIR, 1% (External Cream)  1 Cream apply every 2 hrs while awake for 4 days prn for 0 days  Quantity: 1.5;  Refills: 5   Ordered :14-Aug-2013  Margarita Rana MD;  Started 14-Aug-2013 Active, Disp: , Rfl:   Review of Systems  Constitutional: Negative.   Respiratory: Negative.   Cardiovascular: Negative.   Gastrointestinal: Negative.     Endocrine: Negative.   Neurological: Negative.   Psychiatric/Behavioral: Negative.     Social History  Substance Use Topics  . Smoking status: Former Smoker    Years: 5.00    Quit date: 07/03/1999  . Smokeless tobacco: Never Used  . Alcohol use No   Objective:   BP (!) 160/100 (BP Location: Left Arm, Patient Position: Sitting, Cuff Size: Large)   Pulse 72   Temp 98.8 F (37.1 C) (Oral)   Resp 16   Ht 5\' 7"  (1.702 m)   Wt 237 lb (107.5 kg)   BMI 37.12 kg/m   Physical Exam  Constitutional: She appears well-developed and well-nourished. No distress.  Neck: Normal range of motion. Neck supple. No tracheal deviation present. No thyromegaly present.  Cardiovascular: Normal rate, regular rhythm and normal heart sounds.  Exam reveals no gallop and no friction rub.   No murmur heard. Pulmonary/Chest: Effort normal and breath sounds normal. No respiratory distress. She has no wheezes. She has no rales.  Musculoskeletal: She exhibits no edema.  Lymphadenopathy:    She has no cervical adenopathy.  Skin: She is not diaphoretic.  Psychiatric: She has a normal mood and affect. Her behavior is normal. Judgment and thought content normal.  Vitals reviewed.      Assessment & Plan:     1. Encounter for  weight loss counseling Biometric appeals from completed. Patient is doing weight watchers, a health grade challenge through work and adding exercise. She has been doing very well recently with her weight loss. Discussed weight loss, dieting techniques, exercise, and things to do when she hits a plateau. Spent over 35 minutes with the patient and greater than 50% of the visit was spent counseling the patient.  2. Class 2 obesity due to excess calories without serious comorbidity with body mass index (BMI) of 37.0 to 37.9 in adult See above medical treatment plan.       Mar Daring, PA-C  [facility]

## 2016-06-08 ENCOUNTER — Other Ambulatory Visit: Payer: Self-pay | Admitting: Physician Assistant

## 2016-06-08 DIAGNOSIS — Z1231 Encounter for screening mammogram for malignant neoplasm of breast: Secondary | ICD-10-CM

## 2016-07-05 ENCOUNTER — Telehealth: Payer: Self-pay

## 2016-07-05 DIAGNOSIS — B009 Herpesviral infection, unspecified: Secondary | ICD-10-CM

## 2016-07-05 MED ORDER — ACYCLOVIR 400 MG PO TABS: ORAL_TABLET | ORAL | 1 refills | Status: DC

## 2016-07-05 NOTE — Telephone Encounter (Signed)
Sent to [facility]

## 2016-07-05 NOTE — Telephone Encounter (Signed)
Patient is requesting refill for Acyclovir 400 mg tablet.  1 tablet by mouth two times daily.    Pharmacy: [facility]  For the 3 months supply.  She reports that she works at [employer] and they use [facility].

## 2016-07-19 ENCOUNTER — Ambulatory Visit: Payer: 59

## 2016-08-10 ENCOUNTER — Ambulatory Visit
Admission: RE | Admit: 2016-08-10 | Discharge: 2016-08-10 | Disposition: A | Discharge status: Home / Self Care | Payer: 59 | Source: Ambulatory Visit | Attending: Physician Assistant | Admitting: Physician Assistant

## 2016-08-10 DIAGNOSIS — Z1231 Encounter for screening mammogram for malignant neoplasm of breast: Secondary | ICD-10-CM | POA: Diagnosis not present

## 2016-08-11 ENCOUNTER — Telehealth: Payer: Self-pay

## 2016-08-11 NOTE — Telephone Encounter (Signed)
-----   Message from Mar Daring, [place] sent at 08/10/2016  6:21 PM EST ----- Normal mammogram. Repeat screening in one year.

## 2016-08-11 NOTE — Telephone Encounter (Signed)
Pt advised. Rita Foster, CMA  

## 2016-08-22 ENCOUNTER — Encounter: Payer: Self-pay | Admitting: Physician Assistant

## 2016-08-22 ENCOUNTER — Ambulatory Visit (INDEPENDENT_AMBULATORY_CARE_PROVIDER_SITE_OTHER): Payer: 59 | Admitting: Physician Assistant

## 2016-08-22 VITALS — BP 130/90 | HR 68 | Temp 98.4°F | Resp 16 | Ht 67.0 in | Wt 212.8 lb

## 2016-08-22 DIAGNOSIS — Z1211 Encounter for screening for malignant neoplasm of colon: Secondary | ICD-10-CM

## 2016-08-22 DIAGNOSIS — R300 Dysuria: Secondary | ICD-10-CM

## 2016-08-22 DIAGNOSIS — E7800 Pure hypercholesterolemia, unspecified: Secondary | ICD-10-CM | POA: Diagnosis not present

## 2016-08-22 DIAGNOSIS — Z0000 Encounter for general adult medical examination without abnormal findings: Secondary | ICD-10-CM

## 2016-08-22 DIAGNOSIS — Z124 Encounter for screening for malignant neoplasm of cervix: Secondary | ICD-10-CM

## 2016-08-22 DIAGNOSIS — Z131 Encounter for screening for diabetes mellitus: Secondary | ICD-10-CM

## 2016-08-22 DIAGNOSIS — N3090 Cystitis, unspecified without hematuria: Secondary | ICD-10-CM | POA: Diagnosis not present

## 2016-08-22 LAB — POCT URINALYSIS DIPSTICK
BILIRUBIN UA: NEGATIVE
Glucose, UA: NEGATIVE
KETONES UA: NEGATIVE
Nitrite, UA: NEGATIVE
PH UA: 6
Protein, UA: NEGATIVE
SPEC GRAV UA: 1.015
Urobilinogen, UA: 0.2

## 2016-08-22 LAB — IFOBT (OCCULT BLOOD): IFOBT: NEGATIVE

## 2016-08-22 MED ORDER — SULFAMETHOXAZOLE-TRIMETHOPRIM 800-160 MG PO TABS
1.0000 | ORAL_TABLET | Freq: Two times a day (BID) | ORAL | 0 refills | Status: DC
Start: 2016-08-22 — End: 2016-08-23

## 2016-08-22 MED ORDER — SULFAMETHOXAZOLE-TRIMETHOPRIM 800-160 MG PO TABS: 1.0000 | ORAL_TABLET | Freq: Two times a day (BID) | ORAL | 0 refills | Status: DC

## 2016-08-22 NOTE — Patient Instructions (Signed)

## 2016-08-22 NOTE — Progress Notes (Signed)
Patient: Rita Foster, Female    DOB: Apr 28, 1961, 56 y.o.   MRN: [id number] Visit Date: 08/22/2016  Today's Provider: Mar Daring, PA-C   Chief Complaint  Patient presents with  . Annual Exam   Subjective:    Annual physical exam Rita Foster is a 56 y.o. female who presents today for health maintenance and complete physical. She feels well. She reports exercising none. She reports she is sleeping well.  Last CPE:08/20/2015 Pap:08/20/15-neg, HPV-Neg Mammogram:08/10/2016 BI-RADS 1 Colon 08/29/13 WNL  Had influenza vaccine at [facility]. -----------------------------------------------------------------   Review of Systems  Constitutional: Negative.   HENT: Negative.   Eyes: Negative.   Respiratory: Negative.   Cardiovascular: Negative.   Gastrointestinal: Negative.   Endocrine: Negative.   Genitourinary: Negative.   Musculoskeletal: Negative.   Skin: Negative.   Allergic/Immunologic: Negative.   Neurological: Negative.   Hematological: Negative.   Psychiatric/Behavioral: Negative.     Social History      She  reports that she quit smoking about 17 years ago. She quit after 5.00 years of use. She has never used smokeless tobacco. She reports that she does not drink alcohol or use drugs.       Social History   Social History  . Marital status: Divorced    Spouse name: N/A  . Number of children: N/A  . Years of education: N/A   Social History Main Topics  . Smoking status: Former Smoker    Years: 5.00    Quit date: 07/03/1999  . Smokeless tobacco: Never Used  . Alcohol use No  . Drug use: No  . Sexual activity: Not Asked   Other Topics Concern  . None   Social History Narrative  . None    History reviewed. No pertinent past medical history.   Patient Active Problem List   Diagnosis Date Noted  . Absence of menstruation 04/02/2015  . Body mass index (BMI) of 39.0 to 39.9 in adult 04/02/2015  . Hypercholesteremia  04/02/2015  . Symptomatic menopausal or female climacteric states 04/02/2015  . 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 Herpes 06/19/2008  . Adiposity 09/19/2006  . Asthma 09/14/2006  . Arthralgia of lower leg 09/14/2006    Past Surgical History:  Procedure Laterality Date  . BREAST BIOPSY Left 10+ years ago   Negative core  . BREAST SURGERY  2002   biopsy  . CATARACT EXTRACTION Right 07/2014  . HERNIA REPAIR  02/21/2010  . TONSILLECTOMY AND ADENOIDECTOMY  1968    Family History        Family Status  Relation Status  . Mother Alive  . Father Alive  . Maternal Grandmother Deceased at age 24   natural causes  . Maternal Grandfather Deceased   artherosclerosis  . Paternal 73 Deceased   old age  . Paternal Grandfather Deceased   unknown causes  . Paternal Aunt         Her family history includes Breast cancer in her paternal aunt; Breast cancer (age of onset: 16) in her mother; Crohn's disease in her mother; Hypertension in her father; Rheum arthritis in her mother; Skin cancer in her father; Thyroid disease in her mother.     Allergies  Allergen Reactions  . Penicillins      Current Outpatient Prescriptions:  .  acetaminophen (TYLENOL ARTHRITIS PAIN) 650 MG CR tablet, Take 650 mg by mouth every 8 (eight) hours as needed for pain., Disp: , Rfl:  .  acyclovir (ZOVIRAX) 400 MG tablet, Take 1 tablet by mouth two  times daily, Disp: 180 tablet, Rfl: 1 .  albuterol (PROAIR HFA) 108 (90 BASE) MCG/ACT inhaler, Inhale into the lungs. 2 puffs every 6 hours as needed, Disp: , Rfl:  .  MAGNESIUM PO, Take 1 tablet by mouth daily. , Disp: , Rfl:  .  Misc Natural Products (OSTEO BI-FLEX JOINT SHIELD PO), Take 1 tablet by mouth daily. , Disp: , Rfl:  .  MULTIPLE VITAMIN PO, Take 1 tablet by mouth daily. , Disp: , Rfl:  .  OMEGA-3 FATTY ACIDS PO, Take 2 capsules by mouth daily., Disp: , Rfl:  .  penciclovir (DENAVIR) 1 % cream, DENAVIR, 1% (External  Cream)  1 Cream apply every 2 hrs while awake for 4 days prn for 0 days  Quantity: 1.5;  Refills: 5   Ordered :14-Aug-2013  Margarita Rana MD;  Started 14-Aug-2013 Active, Disp: , Rfl:    Patient Care Team: Margarita Rana, MD as PCP - General (Family Medicine)      Objective:   Vitals: BP 130/90 (BP Location: Right Arm, Patient Position: Sitting, Cuff Size: Large)   Pulse 68   Temp 98.4 F (36.9 C) (Oral)   Resp 16   Ht 5\' 7"  (1.702 m)   Wt 212 lb 12.8 oz (96.5 kg)   BMI 33.33 kg/m    Physical Exam  Constitutional: She is oriented to person, place, and time. She appears well-developed and well-nourished. No distress.  HENT:  Head: Normocephalic and atraumatic.  Right Ear: Hearing, tympanic membrane, external ear and ear canal normal.  Left Ear: Hearing, tympanic membrane, external ear and ear canal normal.  Nose: Nose normal.  Mouth/Throat: Uvula is midline, oropharynx is clear and moist and mucous membranes are normal. No oropharyngeal exudate.  Eyes: Conjunctivae and EOM are normal. Pupils are equal, round, and reactive to light. Right eye exhibits no discharge. Left eye exhibits no discharge. No scleral icterus.  Neck: Normal range of motion. Neck supple. No JVD present. Carotid bruit is not present. No tracheal deviation present. No thyromegaly present.  Cardiovascular: Normal rate, regular rhythm, normal heart sounds and intact distal pulses.  Exam reveals no gallop and no friction rub.   No murmur heard. Pulmonary/Chest: Effort normal and breath sounds normal. No respiratory distress. She has no wheezes. She has no rales. She exhibits no tenderness. Right breast exhibits no inverted nipple, no mass, no nipple discharge, no skin change and no tenderness. Left breast exhibits no inverted nipple, no mass, no nipple discharge, no skin change and no tenderness. Breasts are symmetrical.  Abdominal: Soft. Bowel sounds are normal. She exhibits no distension and no mass. There is no  tenderness. There is no rebound and no guarding. Hernia confirmed negative in the right inguinal area and confirmed negative in the left inguinal area.  Genitourinary: Rectum normal, vagina normal and uterus normal. No breast swelling, tenderness, discharge or bleeding. Pelvic exam was performed with patient supine. There is no rash, tenderness, lesion or injury on the right labia. There is no rash, tenderness, lesion or injury on the left labia. Cervix exhibits no motion tenderness, no discharge and no friability. Right adnexum displays no mass, no tenderness and no fullness. Left adnexum displays no mass, no tenderness and no fullness. No erythema, tenderness or bleeding in the vagina. No signs of injury around the vagina. No vaginal discharge found.  Musculoskeletal: Normal range of motion. She exhibits no edema or tenderness.  Lymphadenopathy:  She has no cervical adenopathy.       Right: No inguinal adenopathy present.       Left: No inguinal adenopathy present.  Neurological: She is alert and oriented to person, place, and time. She has normal reflexes. No cranial nerve deficit. Coordination normal.  Skin: Skin is warm and dry. No rash noted. She is not diaphoretic.  Psychiatric: She has a normal mood and affect. Her behavior is normal. Judgment and thought content normal.  Vitals reviewed.    Depression Screen PHQ 2/9 Scores 08/20/2015  Exception Documentation Patient refusal      Assessment & Plan:     Routine Health Maintenance and Physical Exam  Exercise Activities and Dietary recommendations Goals    None      Immunization History  Administered Date(s) Administered  . Pneumococcal Polysaccharide-23 05/03/2005  . Td 08/04/2002  . Tdap 01/27/2010    Health Maintenance  Topic Date Due  . INFLUENZA VACCINE  02/01/2016  . MAMMOGRAM  08/10/2018  . PAP SMEAR  08/19/2018  . TETANUS/TDAP  01/28/2020  . COLONOSCOPY  08/30/2023  . Hepatitis C Screening  Completed  .  HIV Screening  Completed     Discussed health benefits of physical activity, and encouraged her to engage in regular exercise appropriate for her age and condition.    1. Annual physical exam Normal physical exam today. Will check labs as below and f/u pending lab results. If labs are stable and WNL she will not need to have these rechecked for one year at her next annual physical exam. She is to call the office in the meantime if she has any acute issue, questions or concerns. - CBC with Differential/Platelet - Comprehensive metabolic panel - TSH  2. Colon cancer screening OC lite collected today and was negative. - IFOBT POC (occult bld, rslt in office)  3. Cervical cancer screening Pap collected today. Will send as below and f/u pending results. - Pap IG and HPV (high risk) DNA detection  4. Hypercholesteremia Will check labs as below and f/u pending results. - Lipid panel  5. Screening for diabetes mellitus Will check labs as below and f/u pending results. - Hemoglobin A1c  6. Dysuria Ua positive for pyuria and trace hematuria. Patient admits to not wiping front to back, but has always tried to make sure she stays clean. Will treat empirically with Bactrim x 3 days. Urine culture sent in. Will adjust treatment as needed pending C&S results. Push fluids. Call if no improvements.  - POCT urinalysis dipstick  7. Cystitis See above medical treatment plan. - Urine Culture - sulfamethoxazole-trimethoprim (BACTRIM DS,SEPTRA DS) 800-160 MG tablet; Take 1 tablet by mouth 2 (two) times daily.  Dispense: 6 tablet; Refill: 0  --------------------------------------------------------------------    Mar Daring, PA-C  [facility]

## 2016-08-23 ENCOUNTER — Telehealth: Payer: Self-pay | Admitting: Physician Assistant

## 2016-08-23 ENCOUNTER — Other Ambulatory Visit: Payer: Self-pay

## 2016-08-23 DIAGNOSIS — N3090 Cystitis, unspecified without hematuria: Secondary | ICD-10-CM

## 2016-08-23 MED ORDER — SULFAMETHOXAZOLE-TRIMETHOPRIM 800-160 MG PO TABS: 1.0000 | ORAL_TABLET | Freq: Two times a day (BID) | ORAL | 0 refills | Status: DC

## 2016-08-23 NOTE — Telephone Encounter (Signed)
Pt stated she went to [facility] last night to pick up the medication and was advised that it was sent to [facility]. It looks like we got a [facility] @ 958 am yesterday. Pt would like [facility] resent to [facility] Pt would like to pick this up at lunch so she can get full dose in today. Pt request a call back. Please advise. Thanks TNP

## 2016-08-23 NOTE — Telephone Encounter (Signed)
Done  ED 

## 2016-08-24 LAB — PAP IG AND HPV HIGH-RISK
HPV, high-risk: NEGATIVE
PAP Smear Comment: 0

## 2016-08-24 LAB — URINE CULTURE

## 2016-10-23 ENCOUNTER — Other Ambulatory Visit: Payer: Self-pay | Admitting: Physician Assistant

## 2016-10-23 DIAGNOSIS — B009 Herpesviral infection, unspecified: Secondary | ICD-10-CM

## 2017-11-19 ENCOUNTER — Other Ambulatory Visit: Payer: Self-pay | Admitting: Physician Assistant

## 2017-11-19 DIAGNOSIS — B009 Herpesviral infection, unspecified: Secondary | ICD-10-CM

## 2017-11-19 NOTE — Telephone Encounter (Signed)
Patient needs appt for CPE. Was due in 08/2017.

## 2017-11-19 NOTE — Telephone Encounter (Signed)
Patient advised. CPE scheduled for 12/20/17.

## 2017-12-20 ENCOUNTER — Encounter: Payer: 59 | Admitting: Physician Assistant

## 2018-02-06 ENCOUNTER — Ambulatory Visit (INDEPENDENT_AMBULATORY_CARE_PROVIDER_SITE_OTHER): Payer: Managed Care, Other (non HMO) | Admitting: Physician Assistant

## 2018-02-06 ENCOUNTER — Encounter: Payer: Self-pay | Admitting: Physician Assistant

## 2018-02-06 VITALS — BP 160/100 | HR 72 | Temp 98.2°F | Resp 16 | Ht 67.0 in | Wt 242.0 lb

## 2018-02-06 DIAGNOSIS — Z124 Encounter for screening for malignant neoplasm of cervix: Secondary | ICD-10-CM

## 2018-02-06 DIAGNOSIS — Z008 Encounter for other general examination: Secondary | ICD-10-CM

## 2018-02-06 DIAGNOSIS — E7800 Pure hypercholesterolemia, unspecified: Secondary | ICD-10-CM

## 2018-02-06 DIAGNOSIS — Z0000 Encounter for general adult medical examination without abnormal findings: Secondary | ICD-10-CM

## 2018-02-06 DIAGNOSIS — Z1231 Encounter for screening mammogram for malignant neoplasm of breast: Secondary | ICD-10-CM | POA: Diagnosis not present

## 2018-02-06 DIAGNOSIS — Z0189 Encounter for other specified special examinations: Secondary | ICD-10-CM | POA: Diagnosis not present

## 2018-02-06 DIAGNOSIS — Z6837 Body mass index (BMI) 37.0-37.9, adult: Secondary | ICD-10-CM

## 2018-02-06 DIAGNOSIS — Z1239 Encounter for other screening for malignant neoplasm of breast: Secondary | ICD-10-CM

## 2018-02-06 DIAGNOSIS — B009 Herpesviral infection, unspecified: Secondary | ICD-10-CM

## 2018-02-06 DIAGNOSIS — J4520 Mild intermittent asthma, uncomplicated: Secondary | ICD-10-CM

## 2018-02-06 LAB — POCT URINALYSIS DIPSTICK
Bilirubin, UA: NEGATIVE
Blood, UA: NEGATIVE
Glucose, UA: NEGATIVE
KETONES UA: NEGATIVE
Leukocytes, UA: NEGATIVE
Nitrite, UA: NEGATIVE
PH UA: 6 (ref 5.0–8.0)
PROTEIN UA: NEGATIVE
SPEC GRAV UA: 1.025 (ref 1.010–1.025)
UROBILINOGEN UA: 0.2 U/dL

## 2018-02-06 MED ORDER — ACYCLOVIR 400 MG PO TABS: 400.0000 mg | ORAL_TABLET | Freq: Two times a day (BID) | ORAL | 1 refills | Status: DC

## 2018-02-06 MED ORDER — ALBUTEROL SULFATE HFA 108 (90 BASE) MCG/ACT IN AERS: 1.0000 | INHALATION_SPRAY | Freq: Four times a day (QID) | RESPIRATORY_TRACT | 1 refills | Status: DC | PRN

## 2018-02-06 NOTE — Progress Notes (Signed)
Patient: Rita Foster, Female    DOB: Sep 03, 1960, 57 y.o.   MRN: [id number] Visit Date: 02/06/2018  Today's Provider: Mar Daring, PA-C   Chief Complaint  Patient presents with  . Annual Exam   Subjective:    Annual physical exam Rita Foster is a 57 y.o. female who presents today for health maintenance and complete physical. She feels well. She reports exercising none. She reports she is sleeping fairly well.  08/22/16 CPE 08/22/16 Pap-neg; HPV-neg  08/10/16 Mammogram-BI-RADS 1 08/29/13 Colonoscopy-08/29/13 -----------------------------------------------------------------   Review of Systems  Constitutional: Negative.   HENT: Positive for congestion, sneezing and voice change.   Eyes: Positive for photophobia.  Respiratory: Negative.   Cardiovascular: Negative.   Gastrointestinal: Negative.   Endocrine: Negative.   Genitourinary: Negative.   Musculoskeletal: Positive for arthralgias.  Skin: Negative.   Allergic/Immunologic: Positive for environmental allergies.  Neurological: Negative.   Hematological: Negative.   Psychiatric/Behavioral: Positive for sleep disturbance.    Social History      She  reports that she quit smoking about 18 years ago. She quit after 5.00 years of use. She has never used smokeless tobacco. She reports that she does not drink alcohol or use drugs.       Social History   Socioeconomic History  . Marital status: Divorced    Spouse name: Not on file  . Number of children: Not on file  . Years of education: Not on file  . Highest education level: Not on file  Occupational History  . Not on file  Social Needs  . Financial resource strain: Not on file  . Food insecurity:    Worry: Not on file    Inability: Not on file  . Transportation needs:    Medical: Not on file    Non-medical: Not on file  Tobacco Use  . Smoking status: Former Smoker    Years: 5.00    Last attempt to quit: 07/03/1999    Years since  quitting: 18.6  . Smokeless tobacco: Never Used  Substance and Sexual Activity  . Alcohol use: No  . Drug use: No  . Sexual activity: Not on file  Lifestyle  . Physical activity:    Days per week: Not on file    Minutes per session: Not on file  . Stress: Not on file  Relationships  . Social connections:    Talks on phone: Not on file    Gets together: Not on file    Attends religious service: Not on file    Active member of club or organization: Not on file    Attends meetings of clubs or organizations: Not on file    Relationship status: Not on file  Other Topics Concern  . Not on file  Social History Narrative  . Not on file    No past medical history on file.   Patient Active Problem List   Diagnosis Date Noted  . Absence of menstruation 04/02/2015  . Body mass index (BMI) of 39.0 to 39.9 in adult 04/02/2015  . Hypercholesteremia 04/02/2015  . Symptomatic menopausal or female climacteric states 04/02/2015  . 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 Herpes 06/19/2008  . Adiposity 09/19/2006  . Asthma 09/14/2006  . Arthralgia of lower leg 09/14/2006    Past Surgical History:  Procedure Laterality Date  . BREAST BIOPSY Left 10+ years ago   Negative core  . BREAST SURGERY  2002   biopsy  .  CATARACT EXTRACTION Right 07/2014  . HERNIA REPAIR  02/21/2010  . TONSILLECTOMY AND ADENOIDECTOMY  1968    Family History        Family Status  Relation Name Status  . Mother  Alive  . Father  Alive  . MGM  Deceased at age 45       natural causes  . MGF  Deceased       artherosclerosis  . PGM  Deceased       old age  . PGF  Deceased       unknown causes  . Ethlyn Daniels  (Not Specified)        Her family history includes Breast cancer in her paternal aunt; Breast cancer (age of onset: 73) in her mother; Crohn's disease in her mother; Hypertension in her father; Rheum arthritis in her mother; Skin cancer in her father; Thyroid disease in her  mother.      Allergies  Allergen Reactions  . Penicillins      Current Outpatient Medications:  .  acetaminophen (TYLENOL ARTHRITIS PAIN) 650 MG CR tablet, Take 650 mg by mouth every 8 (eight) hours as needed for pain., Disp: , Rfl:  .  acyclovir (ZOVIRAX) 400 MG tablet, TAKE 1 TABLET BY MOUTH TWO  TIMES DAILY, Disp: 60 tablet, Rfl: 0 .  albuterol (PROAIR HFA) 108 (90 BASE) MCG/ACT inhaler, Inhale into the lungs. 2 puffs every 6 hours as needed, Disp: , Rfl:  .  Misc Natural Products (OSTEO BI-FLEX JOINT SHIELD PO), Take 1 tablet by mouth daily. , Disp: , Rfl:  .  MULTIPLE VITAMIN PO, Take 1 tablet by mouth daily. , Disp: , Rfl:  .  OMEGA-3 FATTY ACIDS PO, Take 2 capsules by mouth daily., Disp: , Rfl:  .  penciclovir (DENAVIR) 1 % cream, DENAVIR, 1% (External Cream)  1 Cream apply every 2 hrs while awake for 4 days prn for 0 days  Quantity: 1.5;  Refills: 5   Ordered :14-Aug-2013  Margarita Rana MD;  Started 14-Aug-2013 Active, Disp: , Rfl:  .  Probiotic Product (PROBIOTIC ADVANCED PO), Take by mouth., Disp: , Rfl:  .  MAGNESIUM PO, Take 1 tablet by mouth daily. , Disp: , Rfl:    Patient Care Team: Mar Daring, PA-C as PCP - General (Family Medicine)      Objective:   Vitals: BP (!) 160/100 (BP Location: Left Arm, Patient Position: Sitting, Cuff Size: Large)   Pulse 72   Temp 98.2 F (36.8 C) (Oral)   Resp 16   Ht 5\' 7"  (1.702 m)   Wt 242 lb (109.8 kg)   SpO2 97%   BMI 37.90 kg/m    Vitals:   02/06/18 0916  BP: (!) 160/100  Pulse: 72  Resp: 16  Temp: 98.2 F (36.8 C)  TempSrc: Oral  SpO2: 97%  Weight: 242 lb (109.8 kg)  Height: 5\' 7"  (1.702 m)     Physical Exam  Constitutional: She is oriented to person, place, and time. She appears well-developed and well-nourished. No distress.  HENT:  Head: Normocephalic and atraumatic.  Right Ear: Hearing, tympanic membrane, external ear and ear canal normal.  Left Ear: Hearing, tympanic membrane, external ear and  ear canal normal.  Nose: Nose normal.  Mouth/Throat: Uvula is midline, oropharynx is clear and moist and mucous membranes are normal. No oropharyngeal exudate.  Eyes: Pupils are equal, round, and reactive to light. Conjunctivae and EOM are normal. Right eye exhibits no discharge. Left eye exhibits  no discharge. No scleral icterus.  Neck: Normal range of motion. Neck supple. No JVD present. Carotid bruit is not present. No tracheal deviation present. No thyromegaly present.  Cardiovascular: Normal rate, regular rhythm, normal heart sounds and intact distal pulses. Exam reveals no gallop and no friction rub.  No murmur heard. Pulmonary/Chest: Effort normal and breath sounds normal. No respiratory distress. She has no wheezes. She has no rales. She exhibits no tenderness. Right breast exhibits no inverted nipple, no mass, no nipple discharge, no skin change and no tenderness. Left breast exhibits no inverted nipple, no mass, no nipple discharge, no skin change and no tenderness. No breast tenderness, discharge or bleeding. Breasts are symmetrical.  Abdominal: Soft. Bowel sounds are normal. She exhibits no distension and no mass. There is no tenderness. There is no rebound and no guarding. Hernia confirmed negative in the right inguinal area and confirmed negative in the left inguinal area.  Genitourinary: Rectum normal, vagina normal and uterus normal. No breast tenderness, discharge or bleeding. Pelvic exam was performed with patient supine. There is no rash, tenderness, lesion or injury on the right labia. There is no rash, tenderness, lesion or injury on the left labia. Cervix exhibits no motion tenderness, no discharge and no friability. Right adnexum displays no mass, no tenderness and no fullness. Left adnexum displays no mass, no tenderness and no fullness. No erythema, tenderness or bleeding in the vagina. No signs of injury around the vagina. No vaginal discharge found.  Musculoskeletal: Normal range  of motion. She exhibits no edema or tenderness.  Lymphadenopathy:    She has no cervical adenopathy.       Right: No inguinal adenopathy present.       Left: No inguinal adenopathy present.  Neurological: She is alert and oriented to person, place, and time. She has normal reflexes. No cranial nerve deficit. Coordination normal.  Skin: Skin is warm and dry. No rash noted. She is not diaphoretic.  Psychiatric: She has a normal mood and affect. Her behavior is normal. Judgment and thought content normal.  Vitals reviewed.    Depression Screen PHQ 2/9 Scores 02/06/2018 08/20/2015  PHQ - 2 Score 0 -  PHQ- 9 Score 5 -  Exception Documentation - Patient refusal      Assessment & Plan:     Routine Health Maintenance and Physical Exam  Exercise Activities and Dietary recommendations Goals    None      Immunization History  Administered Date(s) Administered  . Pneumococcal Polysaccharide-23 05/03/2005  . Td 08/04/2002  . Tdap 01/27/2010    Health Maintenance  Topic Date Due  . INFLUENZA VACCINE  01/31/2018  . MAMMOGRAM  08/10/2018  . PAP SMEAR  08/23/2019  . TETANUS/TDAP  01/28/2020  . COLONOSCOPY  08/30/2023  . Hepatitis C Screening  Completed  . HIV Screening  Completed     Discussed health benefits of physical activity, and encouraged her to engage in regular exercise appropriate for her age and condition.    1. Annual physical exam Normal physical exam today. Will check labs as below and f/u pending lab results. If labs are stable and WNL she will not need to have these rechecked for one year at her next annual physical exam. She is to call the office in the meantime if she has any acute issue, questions or concerns. - POCT urinalysis dipstick - CBC w/Diff/Platelet - Comprehensive Metabolic Panel (CMET) - TSH - Lipid Profile - HgB A1c  2. Breast cancer screening Breast exam  today was normal. There is no family history of breast cancer. She does perform regular  self breast exams. Mammogram was ordered as below. Information for [facility] clinic was given to patient so she may schedule her mammogram at her convenience. - MM Digital Screening; Future  3. Cervical cancer screening Pap collected today. Will send as below and f/u pending results. - Pap IG, CT/NG NAA, and HPV (high risk)  4. Encounter for biometric screening Will check labs as below and f/u pending results. - Comprehensive Metabolic Panel (CMET) - Lipid Profile - HgB A1c  5. Mild intermittent asthma without complication Stable. Diagnosis pulled for medication refill. Continue current medical treatment plan. - albuterol (PROAIR HFA) 108 (90 Base) MCG/ACT inhaler; Inhale 1-2 puffs into the lungs every 6 (six) hours as needed for wheezing or shortness of breath. 2 puffs every 6 hours as needed  Dispense: 18 g; Refill: 1  6. Hypercholesteremia Diet controlled. Will check labs as below and f/u pending results. - Lipid Profile  7. Herpes Stable. Diagnosis pulled for medication refill. Continue current medical treatment plan. - acyclovir (ZOVIRAX) 400 MG tablet; Take 1 tablet (400 mg total) by mouth 2 (two) times daily.  Dispense: 180 tablet; Refill: 1  8. BMI 37.0-37.9, adult Counseled patient on healthy lifestyle modifications including dieting and exercise.   --------------------------------------------------------------------    Mar Daring, PA-C  [facility]

## 2018-02-06 NOTE — Patient Instructions (Signed)
Health Maintenance for Postmenopausal Women Menopause is a normal process in which your reproductive ability comes to an end. This process happens gradually over a span of months to years, usually between the ages of 22 and 9. Menopause is complete when you have missed 12 consecutive menstrual periods. It is important to talk with your health care provider about some of the most common conditions that affect postmenopausal women, such as heart disease, cancer, and bone loss (osteoporosis). Adopting a healthy lifestyle and getting preventive care can help to promote your health and wellness. Those actions can also lower your chances of developing some of these common conditions. What should I know about menopause? During menopause, you may experience a number of symptoms, such as:  Moderate-to-severe hot flashes.  Night sweats.  Decrease in sex drive.  Mood swings.  Headaches.  Tiredness.  Irritability.  Memory problems.  Insomnia.  Choosing to treat or not to treat menopausal changes is an individual decision that you make with your health care provider. What should I know about hormone replacement therapy and supplements? Hormone therapy products are effective for treating symptoms that are associated with menopause, such as hot flashes and night sweats. Hormone replacement carries certain risks, especially as you become older. If you are thinking about using estrogen or estrogen with progestin treatments, discuss the benefits and risks with your health care provider. What should I know about heart disease and stroke? Heart disease, heart attack, and stroke become more likely as you age. This may be due, in part, to the hormonal changes that your body experiences during menopause. These can affect how your body processes dietary fats, triglycerides, and cholesterol. Heart attack and stroke are both medical emergencies. There are many things that you can do to help prevent heart disease  and stroke:  Have your blood pressure checked at least every 1-2 years. High blood pressure causes heart disease and increases the risk of stroke.  If you are 53-22 years old, ask your health care provider if you should take aspirin to prevent a heart attack or a stroke.  Do not use any tobacco products, including cigarettes, chewing tobacco, or electronic cigarettes. If you need help quitting, ask your health care provider.  It is important to eat a healthy diet and maintain a healthy weight. ? Be sure to include plenty of vegetables, fruits, low-fat dairy products, and lean protein. ? Avoid eating foods that are high in solid fats, added sugars, or salt (sodium).  Get regular exercise. This is one of the most important things that you can do for your health. ? Try to exercise for at least 150 minutes each week. The type of exercise that you do should increase your heart rate and make you sweat. This is known as moderate-intensity exercise. ? Try to do strengthening exercises at least twice each week. Do these in addition to the moderate-intensity exercise.  Know your numbers.Ask your health care provider to check your cholesterol and your blood glucose. Continue to have your blood tested as directed by your health care provider.  What should I know about cancer screening? There are several types of cancer. Take the following steps to reduce your risk and to catch any cancer development as early as possible. Breast Cancer  Practice breast self-awareness. ? This means understanding how your breasts normally appear and feel. ? It also means doing regular breast self-exams. Let your health care provider know about any changes, no matter how small.  If you are 40  or older, have a clinician do a breast exam (clinical breast exam or CBE) every year. Depending on your age, family history, and medical history, it may be recommended that you also have a yearly breast X-ray (mammogram).  If you  have a family history of breast cancer, talk with your health care provider about genetic screening.  If you are at high risk for breast cancer, talk with your health care provider about having an MRI and a mammogram every year.  Breast cancer (BRCA) gene test is recommended for women who have family members with BRCA-related cancers. Results of the assessment will determine the need for genetic counseling and BRCA1 and for BRCA2 testing. BRCA-related cancers include these types: ? Breast. This occurs in males or females. ? Ovarian. ? Tubal. This may also be called fallopian tube cancer. ? Cancer of the abdominal or pelvic lining (peritoneal cancer). ? Prostate. ? Pancreatic.  Cervical, Uterine, and Ovarian Cancer Your health care provider may recommend that you be screened regularly for cancer of the pelvic organs. These include your ovaries, uterus, and vagina. This screening involves a pelvic exam, which includes checking for microscopic changes to the surface of your cervix (Pap test).  For women ages 21-65, health care providers may recommend a pelvic exam and a Pap test every three years. For women ages 79-65, they may recommend the Pap test and pelvic exam, combined with testing for human papilloma virus (HPV), every five years. Some types of HPV increase your risk of cervical cancer. Testing for HPV may also be done on women of any age who have unclear Pap test results.  Other health care providers may not recommend any screening for nonpregnant women who are considered low risk for pelvic cancer and have no symptoms. Ask your health care provider if a screening pelvic exam is right for you.  If you have had past treatment for cervical cancer or a condition that could lead to cancer, you need Pap tests and screening for cancer for at least 20 years after your treatment. If Pap tests have been discontinued for you, your risk factors (such as having a new sexual partner) need to be  reassessed to determine if you should start having screenings again. Some women have medical problems that increase the chance of getting cervical cancer. In these cases, your health care provider may recommend that you have screening and Pap tests more often.  If you have a family history of uterine cancer or ovarian cancer, talk with your health care provider about genetic screening.  If you have vaginal bleeding after reaching menopause, tell your health care provider.  There are currently no reliable tests available to screen for ovarian cancer.  Lung Cancer Lung cancer screening is recommended for adults 69-62 years old who are at high risk for lung cancer because of a history of smoking. A yearly low-dose CT scan of the lungs is recommended if you:  Currently smoke.  Have a history of at least 30 pack-years of smoking and you currently smoke or have quit within the past 15 years. A pack-year is smoking an average of one pack of cigarettes per day for one year.  Yearly screening should:  Continue until it has been 15 years since you quit.  Stop if you develop a health problem that would prevent you from having lung cancer treatment.  Colorectal Cancer  This type of cancer can be detected and can often be prevented.  Routine colorectal cancer screening usually begins at  age 42 and continues through age 45.  If you have risk factors for colon cancer, your health care provider may recommend that you be screened at an earlier age.  If you have a family history of colorectal cancer, talk with your health care provider about genetic screening.  Your health care provider may also recommend using home test kits to check for hidden blood in your stool.  A small camera at the end of a tube can be used to examine your colon directly (sigmoidoscopy or colonoscopy). This is done to check for the earliest forms of colorectal cancer.  Direct examination of the colon should be repeated every  5-10 years until age 71. However, if early forms of precancerous polyps or small growths are found or if you have a family history or genetic risk for colorectal cancer, you may need to be screened more often.  Skin Cancer  Check your skin from head to toe regularly.  Monitor any moles. Be sure to tell your health care provider: ? About any new moles or changes in moles, especially if there is a change in a mole's shape or color. ? If you have a mole that is larger than the size of a pencil eraser.  If any of your family members has a history of skin cancer, especially at a young age, talk with your health care provider about genetic screening.  Always use sunscreen. Apply sunscreen liberally and repeatedly throughout the day.  Whenever you are outside, protect yourself by wearing long sleeves, pants, a wide-brimmed hat, and sunglasses.  What should I know about osteoporosis? Osteoporosis is a condition in which bone destruction happens more quickly than new bone creation. After menopause, you may be at an increased risk for osteoporosis. To help prevent osteoporosis or the bone fractures that can happen because of osteoporosis, the following is recommended:  If you are 46-71 years old, get at least 1,000 mg of calcium and at least 600 mg of vitamin D per day.  If you are older than age 55 but younger than age 65, get at least 1,200 mg of calcium and at least 600 mg of vitamin D per day.  If you are older than age 54, get at least 1,200 mg of calcium and at least 800 mg of vitamin D per day.  Smoking and excessive alcohol intake increase the risk of osteoporosis. Eat foods that are rich in calcium and vitamin D, and do weight-bearing exercises several times each week as directed by your health care provider. What should I know about how menopause affects my mental health? Depression may occur at any age, but it is more common as you become older. Common symptoms of depression  include:  Low or sad mood.  Changes in sleep patterns.  Changes in appetite or eating patterns.  Feeling an overall lack of motivation or enjoyment of activities that you previously enjoyed.  Frequent crying spells.  Talk with your health care provider if you think that you are experiencing depression. What should I know about immunizations? It is important that you get and maintain your immunizations. These include:  Tetanus, diphtheria, and pertussis (Tdap) booster vaccine.  Influenza every year before the flu season begins.  Pneumonia vaccine.  Shingles vaccine.  Your health care provider may also recommend other immunizations. This information is not intended to replace advice given to you by your health care provider. Make sure you discuss any questions you have with your health care provider. Document Released: 08/11/2005  Document Revised: 01/07/2016 Document Reviewed: 03/23/2015 Elsevier Interactive Patient Education  2018 [employer]  

## 2018-02-11 ENCOUNTER — Telehealth: Payer: Self-pay

## 2018-02-11 LAB — PAP IG, CT-NG NAA, HPV HIGH-RISK
Chlamydia, Nuc. Acid Amp: NEGATIVE
Gonococcus by Nucleic Acid Amp: NEGATIVE
PAP Smear Comment: 0

## 2018-02-11 LAB — HPV, LOW VOLUME (REFLEX): HPV, LOW VOL REFLEX: NEGATIVE

## 2018-02-11 NOTE — Telephone Encounter (Signed)
-----   Message from Mar Daring, PA-C sent at 02/11/2018  1:50 PM EDT ----- Pap is normal, HPV negative. GC/chlamydia negative.

## 2018-02-11 NOTE — Telephone Encounter (Signed)
Patient advised as directed below.  Thanks,  -Joseline 

## 2018-04-18 ENCOUNTER — Telehealth: Payer: Self-pay

## 2018-04-18 NOTE — Telephone Encounter (Signed)
No answer  FYI Calling patient to let her know her health screening form is placed up front ready for pick up.  Thanks,  -Joseline

## 2018-04-18 NOTE — Telephone Encounter (Signed)
Pt returned call and advised below. Thanks TNP

## 2018-06-20 ENCOUNTER — Other Ambulatory Visit: Payer: Self-pay | Admitting: Physician Assistant

## 2018-06-20 DIAGNOSIS — Z1239 Encounter for other screening for malignant neoplasm of breast: Secondary | ICD-10-CM

## 2018-07-08 ENCOUNTER — Ambulatory Visit
Admission: RE | Admit: 2018-07-08 | Discharge: 2018-07-08 | Disposition: A | Discharge status: Home / Self Care | Payer: Managed Care, Other (non HMO) | Source: Ambulatory Visit | Attending: Physician Assistant | Admitting: Physician Assistant

## 2018-07-08 DIAGNOSIS — Z1239 Encounter for other screening for malignant neoplasm of breast: Secondary | ICD-10-CM | POA: Insufficient documentation

## 2019-01-15 ENCOUNTER — Other Ambulatory Visit: Payer: Self-pay | Admitting: Physician Assistant

## 2019-01-15 DIAGNOSIS — B009 Herpesviral infection, unspecified: Secondary | ICD-10-CM

## 2019-02-10 ENCOUNTER — Encounter: Payer: Self-pay | Admitting: Physician Assistant

## 2019-03-25 ENCOUNTER — Inpatient Hospital Stay: Payer: Managed Care, Other (non HMO)

## 2019-03-25 ENCOUNTER — Emergency Department: Payer: Managed Care, Other (non HMO)

## 2019-03-25 ENCOUNTER — Other Ambulatory Visit: Payer: Self-pay

## 2019-03-25 ENCOUNTER — Encounter: Payer: Self-pay | Admitting: Emergency Medicine

## 2019-03-25 ENCOUNTER — Inpatient Hospital Stay
Admission: EM | Admit: 2019-03-25 | Discharge: 2019-03-28 | DRG: 389 | Disposition: A | Discharge status: Home / Self Care | Payer: Managed Care, Other (non HMO) | Attending: Surgery | Admitting: Surgery

## 2019-03-25 DIAGNOSIS — Z808 Family history of malignant neoplasm of other organs or systems: Secondary | ICD-10-CM

## 2019-03-25 DIAGNOSIS — I10 Essential (primary) hypertension: Secondary | ICD-10-CM | POA: Diagnosis present

## 2019-03-25 DIAGNOSIS — R109 Unspecified abdominal pain: Secondary | ICD-10-CM

## 2019-03-25 DIAGNOSIS — Z20828 Contact with and (suspected) exposure to other viral communicable diseases: Secondary | ICD-10-CM | POA: Diagnosis present

## 2019-03-25 DIAGNOSIS — Z803 Family history of malignant neoplasm of breast: Secondary | ICD-10-CM | POA: Diagnosis not present

## 2019-03-25 DIAGNOSIS — Z880 Allergy status to penicillin: Secondary | ICD-10-CM | POA: Diagnosis not present

## 2019-03-25 DIAGNOSIS — Z8249 Family history of ischemic heart disease and other diseases of the circulatory system: Secondary | ICD-10-CM

## 2019-03-25 DIAGNOSIS — Z6841 Body Mass Index (BMI) 40.0 and over, adult: Secondary | ICD-10-CM

## 2019-03-25 DIAGNOSIS — Z7982 Long term (current) use of aspirin: Secondary | ICD-10-CM

## 2019-03-25 DIAGNOSIS — J45909 Unspecified asthma, uncomplicated: Secondary | ICD-10-CM | POA: Diagnosis present

## 2019-03-25 DIAGNOSIS — Z8349 Family history of other endocrine, nutritional and metabolic diseases: Secondary | ICD-10-CM

## 2019-03-25 DIAGNOSIS — Z978 Presence of other specified devices: Secondary | ICD-10-CM

## 2019-03-25 DIAGNOSIS — K5651 Intestinal adhesions [bands], with partial obstruction: Principal | ICD-10-CM | POA: Diagnosis present

## 2019-03-25 DIAGNOSIS — Z87891 Personal history of nicotine dependence: Secondary | ICD-10-CM

## 2019-03-25 DIAGNOSIS — E876 Hypokalemia: Secondary | ICD-10-CM | POA: Diagnosis not present

## 2019-03-25 DIAGNOSIS — E7800 Pure hypercholesterolemia, unspecified: Secondary | ICD-10-CM | POA: Diagnosis present

## 2019-03-25 DIAGNOSIS — Z8261 Family history of arthritis: Secondary | ICD-10-CM

## 2019-03-25 DIAGNOSIS — K56609 Unspecified intestinal obstruction, unspecified as to partial versus complete obstruction: Secondary | ICD-10-CM

## 2019-03-25 DIAGNOSIS — Z79899 Other long term (current) drug therapy: Secondary | ICD-10-CM

## 2019-03-25 DIAGNOSIS — R140 Abdominal distension (gaseous): Secondary | ICD-10-CM

## 2019-03-25 DIAGNOSIS — M222X9 Patellofemoral disorders, unspecified knee: Secondary | ICD-10-CM

## 2019-03-25 DIAGNOSIS — Z23 Encounter for immunization: Secondary | ICD-10-CM

## 2019-03-25 DIAGNOSIS — M25561 Pain in right knee: Secondary | ICD-10-CM

## 2019-03-25 DIAGNOSIS — M25861 Other specified joint disorders, right knee: Secondary | ICD-10-CM | POA: Diagnosis present

## 2019-03-25 LAB — CBC WITH DIFFERENTIAL/PLATELET
Abs Immature Granulocytes: 0.06 10*3/uL (ref 0.00–0.07)
Basophils Absolute: 0 10*3/uL (ref 0.0–0.1)
Basophils Relative: 0 %
Eosinophils Absolute: 0 10*3/uL (ref 0.0–0.5)
Eosinophils Relative: 0 %
HCT: 46.2 % — ABNORMAL HIGH (ref 36.0–46.0)
Hemoglobin: 15.4 g/dL — ABNORMAL HIGH (ref 12.0–15.0)
Immature Granulocytes: 1 %
Lymphocytes Relative: 8 %
Lymphs Abs: 1 10*3/uL (ref 0.7–4.0)
MCH: 29.9 pg (ref 26.0–34.0)
MCHC: 33.3 g/dL (ref 30.0–36.0)
MCV: 89.7 fL (ref 80.0–100.0)
Monocytes Absolute: 0.5 10*3/uL (ref 0.1–1.0)
Monocytes Relative: 4 %
Neutro Abs: 11.6 10*3/uL — ABNORMAL HIGH (ref 1.7–7.7)
Neutrophils Relative %: 87 %
Platelets: 317 10*3/uL (ref 150–400)
RBC: 5.15 MIL/uL — ABNORMAL HIGH (ref 3.87–5.11)
RDW: 13 % (ref 11.5–15.5)
WBC: 13.2 10*3/uL — ABNORMAL HIGH (ref 4.0–10.5)
nRBC: 0 % (ref 0.0–0.2)

## 2019-03-25 LAB — URINALYSIS, COMPLETE (UACMP) WITH MICROSCOPIC
Bacteria, UA: NONE SEEN
Bilirubin Urine: NEGATIVE
Glucose, UA: NEGATIVE mg/dL
Hgb urine dipstick: NEGATIVE
Ketones, ur: 20 mg/dL — AB
Nitrite: NEGATIVE
Protein, ur: 30 mg/dL — AB
Specific Gravity, Urine: 1.024 (ref 1.005–1.030)
pH: 5 (ref 5.0–8.0)

## 2019-03-25 LAB — COMPREHENSIVE METABOLIC PANEL
ALT: 18 U/L (ref 0–44)
AST: 22 U/L (ref 15–41)
Albumin: 4.3 g/dL (ref 3.5–5.0)
Alkaline Phosphatase: 64 U/L (ref 38–126)
Anion gap: 13 (ref 5–15)
BUN: 17 mg/dL (ref 6–20)
CO2: 21 mmol/L — ABNORMAL LOW (ref 22–32)
Calcium: 9.4 mg/dL (ref 8.9–10.3)
Chloride: 103 mmol/L (ref 98–111)
Creatinine, Ser: 0.61 mg/dL (ref 0.44–1.00)
GFR calc Af Amer: >60 mL/min (ref >60)
GFR calc non Af Amer: >60 mL/min (ref >60)
Glucose, Bld: 118 mg/dL — ABNORMAL HIGH (ref 70–99)
Potassium: 4 mmol/L (ref 3.5–5.1)
Sodium: 137 mmol/L (ref 135–145)
Total Bilirubin: 0.8 mg/dL (ref 0.3–1.2)
Total Protein: 7.7 g/dL (ref 6.5–8.1)

## 2019-03-25 LAB — LIPASE, BLOOD: Lipase: 23 U/L (ref 11–51)

## 2019-03-25 MED ORDER — LISINOPRIL 10 MG PO TABS
10.0000 mg | ORAL_TABLET | Freq: Every day | ORAL | Status: DC
  Administered 2019-03-25 – 2019-03-27 (×2): 10 mg via ORAL
  Filled 2019-03-25 (×2): qty 1

## 2019-03-25 MED ORDER — PHENOL 1.4 % MT LIQD
1.0000 | OROMUCOSAL | Status: DC | PRN
  Filled 2019-03-25: qty 177

## 2019-03-25 MED ORDER — INFLUENZA VAC SPLIT QUAD 0.5 ML IM SUSY
0.5000 mL | PREFILLED_SYRINGE | INTRAMUSCULAR | Status: AC
  Administered 2019-03-27: 0.5 mL via INTRAMUSCULAR
  Filled 2019-03-25: qty 0.5

## 2019-03-25 MED ORDER — ONDANSETRON 4 MG PO TBDP: 4.0000 mg | ORAL_TABLET | Freq: Four times a day (QID) | ORAL | Status: DC | PRN

## 2019-03-25 MED ORDER — IOHEXOL 350 MG/ML SOLN
100.0000 mL | Freq: Once | INTRAVENOUS | Status: AC | PRN
  Administered 2019-03-25: 100 mL via INTRAVENOUS

## 2019-03-25 MED ORDER — KETOROLAC TROMETHAMINE 30 MG/ML IJ SOLN: 30.0000 mg | Freq: Four times a day (QID) | INTRAMUSCULAR | Status: DC | PRN

## 2019-03-25 MED ORDER — LABETALOL HCL 5 MG/ML IV SOLN
10.0000 mg | Freq: Four times a day (QID) | INTRAVENOUS | Status: DC | PRN
  Administered 2019-03-25 – 2019-03-28 (×3): 10 mg via INTRAVENOUS
  Filled 2019-03-25 (×4): qty 4

## 2019-03-25 MED ORDER — MORPHINE SULFATE (PF) 4 MG/ML IV SOLN
4.0000 mg | Freq: Once | INTRAVENOUS | Status: AC
  Administered 2019-03-25: 4 mg via INTRAVENOUS
  Filled 2019-03-25: qty 1

## 2019-03-25 MED ORDER — AMLODIPINE BESYLATE 5 MG PO TABS
5.0000 mg | ORAL_TABLET | Freq: Every day | ORAL | Status: DC
  Administered 2019-03-25 – 2019-03-27 (×2): 5 mg via ORAL
  Filled 2019-03-25 (×2): qty 1

## 2019-03-25 MED ORDER — HYDRALAZINE HCL 20 MG/ML IJ SOLN
10.0000 mg | INTRAMUSCULAR | Status: DC | PRN
  Administered 2019-03-25 – 2019-03-26 (×4): 10 mg via INTRAVENOUS
  Filled 2019-03-25 (×4): qty 1

## 2019-03-25 MED ORDER — ACETAMINOPHEN 500 MG PO TABS
1000.0000 mg | ORAL_TABLET | Freq: Four times a day (QID) | ORAL | Status: DC
  Filled 2019-03-25: qty 2

## 2019-03-25 MED ORDER — ONDANSETRON HCL 4 MG/2ML IJ SOLN
4.0000 mg | Freq: Four times a day (QID) | INTRAMUSCULAR | Status: DC | PRN
  Administered 2019-03-25 – 2019-03-26 (×3): 4 mg via INTRAVENOUS
  Filled 2019-03-25 (×3): qty 2

## 2019-03-25 MED ORDER — KETOROLAC TROMETHAMINE 30 MG/ML IJ SOLN
30.0000 mg | Freq: Four times a day (QID) | INTRAMUSCULAR | Status: DC
  Administered 2019-03-25 – 2019-03-28 (×12): 30 mg via INTRAVENOUS
  Filled 2019-03-25 (×12): qty 1

## 2019-03-25 MED ORDER — ENOXAPARIN SODIUM 40 MG/0.4ML ~~LOC~~ SOLN
40.0000 mg | SUBCUTANEOUS | Status: DC
  Administered 2019-03-25: 40 mg via SUBCUTANEOUS
  Filled 2019-03-25: qty 0.4

## 2019-03-25 MED ORDER — MORPHINE SULFATE (PF) 2 MG/ML IV SOLN
2.0000 mg | INTRAVENOUS | Status: DC | PRN
  Administered 2019-03-25: 2 mg via INTRAVENOUS
  Administered 2019-03-25: 4 mg via INTRAVENOUS
  Administered 2019-03-26: 2 mg via INTRAVENOUS
  Filled 2019-03-25: qty 1
  Filled 2019-03-25 (×2): qty 2
  Filled 2019-03-25: qty 1

## 2019-03-25 MED ORDER — SODIUM CHLORIDE 0.9 % IV BOLUS
1000.0000 mL | Freq: Once | INTRAVENOUS | Status: AC
  Administered 2019-03-25: 1000 mL via INTRAVENOUS

## 2019-03-25 MED ORDER — DEXTROSE IN LACTATED RINGERS 5 % IV SOLN
INTRAVENOUS | Status: DC
  Administered 2019-03-25 – 2019-03-27 (×6): via INTRAVENOUS

## 2019-03-25 MED ORDER — ONDANSETRON HCL 4 MG/2ML IJ SOLN
4.0000 mg | Freq: Once | INTRAMUSCULAR | Status: AC
  Administered 2019-03-25: 4 mg via INTRAVENOUS
  Filled 2019-03-25: qty 2

## 2019-03-25 NOTE — Consult Note (Signed)
[facility] NAME: Rita Foster    MR#:  [id number]  DATE OF BIRTH:  24-Dec-1960  DATE OF ADMISSION:  03/25/2019  PRIMARY CARE PHYSICIAN: Rita Daring, PA-C   REQUESTING/REFERRING PHYSICIAN: Otho Ket, PA-C  CHIEF COMPLAINT:   Chief Complaint  Patient presents with   Abdominal Pain    HISTORY OF PRESENT ILLNESS:  Rita Foster  is a 58 y.o. female with a known history of hypertension and obesity who presented to the ED with vomiting and abdominal pain.  Patient states that her symptoms started last night.  She describes the pain as "crampy" and "all over".  She had multiple episodes of vomiting last night and again this morning.  She has never had any type of abdominal surgery before.  She states that she was on 2 blood pressure medicines many years ago, but these were stopped after she lost a lot of weight and her blood pressure normalized.  She states she has not been eating very healthy since COVID, and she thinks she has put on some more weight.  She has not checked her blood pressures at home recently.  In the ED, CT abdomen/pelvis showed a small bowel obstruction.  She was admitted to the surgical service.  Hospitalists were called for management of her hypertension.  PAST MEDICAL HISTORY:  Hypertension  PAST SURGICAL HISTOIRY:   Past Surgical History:  Procedure Laterality Date   BREAST BIOPSY Left 10+ years ago   Negative core   BREAST SURGERY  2002   biopsy   CATARACT EXTRACTION Right 07/2014   HERNIA REPAIR  02/21/2010   TONSILLECTOMY AND ADENOIDECTOMY  1968    SOCIAL HISTORY:   Social History   Tobacco Use   Smoking status: Former Smoker    Years: 5.00    Quit date: 07/03/1999    Years since quitting: 19.7   Smokeless tobacco: Never Used  Substance Use Topics   Alcohol use: No    FAMILY HISTORY:   Family History  Problem Relation Age of Onset   Crohn's disease Mother      Thyroid disease Mother    Rheum arthritis Mother    Breast cancer Mother 29   Hypertension Father    Skin cancer Father    Breast cancer Paternal Aunt     DRUG ALLERGIES:   Allergies  Allergen Reactions   Penicillins     REVIEW OF SYSTEMS:  CONSTITUTIONAL: No fever, fatigue or weakness.  EYES: No blurred or double vision.  EARS, NOSE, AND THROAT: No tinnitus or ear pain.  RESPIRATORY: No cough, shortness of breath, wheezing or hemoptysis.  CARDIOVASCULAR: No chest pain, orthopnea, edema.  GASTROINTESTINAL: + Nausea, + vomiting, + abdominal pain GENITOURINARY: No dysuria, hematuria.  ENDOCRINE: No polyuria, nocturia,  HEMATOLOGY: No anemia, easy bruising or bleeding SKIN: No rash or lesion. MUSCULOSKELETAL: No joint pain or arthritis.   NEUROLOGIC: No tingling, numbness, weakness.  PSYCHIATRY: No anxiety or depression.   MEDICATIONS AT HOME:   Prior to Admission medications   Medication Sig Start Date End Date Taking? Authorizing Provider  acetaminophen (TYLENOL) 500 MG tablet Take 1,000 mg by mouth at bedtime as needed for mild pain or moderate pain.   Yes [provider]  acyclovir (ZOVIRAX) 400 MG tablet TAKE 1 TABLET BY MOUTH TWICE DAILY Patient taking differently: Take 800 mg by mouth daily.  01/15/19  Yes Rita Daring, PA-C  aspirin EC 81 MG tablet Take  81 mg by mouth every evening.   Yes [provider]  Misc Natural Products (OSTEO BI-FLEX JOINT SHIELD) TABS Take 2 tablets by mouth daily at 12 noon.    Yes [provider]  Multiple Vitamin tablet Take 1 tablet by mouth daily.    Yes [provider]  naproxen sodium (ALEVE) 220 MG tablet Take 440 mg by mouth daily as needed (pain).   Yes [provider]  Omega-3 Fatty Acids (FISH OIL) 1000 MG CAPS Take 2,000 mg by mouth daily at 12 noon.   Yes [provider]  Turmeric 500 MG CAPS Take 1,000 mg by mouth daily at 12 noon.   Yes [provider]      VITAL SIGNS:  Blood pressure (!) 187/97, pulse 91, temperature 97.9 F (36.6 C), temperature source Oral, resp. rate 18, height 5\' 8"  (1.727 m), weight 122.5 kg, SpO2 99 %.  PHYSICAL EXAMINATION:  GENERAL:  58 y.o.-year-old patient lying in the bed with no acute distress.  EYES: Pupils equal, round, reactive to light and accommodation. No scleral icterus. Extraocular muscles intact.  HEENT: Head atraumatic, normocephalic. Oropharynx and nasopharynx clear.  NECK:  Supple, no jugular venous distention. No thyroid enlargement, no tenderness.  LUNGS: Normal breath sounds bilaterally, no wheezing, rales,rhonchi or crepitation. No use of accessory muscles of respiration.  CARDIOVASCULAR: RRR, S1, S2 normal. No murmurs, rubs, or gallops.  ABDOMEN: Soft, nondistended. Bowel sounds present. No organomegaly or mass. + Generalized tenderness to palpation, no rebound or guarding. EXTREMITIES: No pedal edema, cyanosis, or clubbing.  NEUROLOGIC: Cranial nerves II through XII are intact. Muscle strength 5/5 in all extremities. Sensation intact. Gait not checked.  PSYCHIATRIC: The patient is alert and oriented x 3.  SKIN: No obvious rash, lesion, or ulcer.  LABORATORY PANEL:   CBC Recent Labs  Lab 03/25/19 1116  WBC 13.2*  HGB 15.4*  HCT 46.2*  PLT 317   ------------------------------------------------------------------------------------------------------------------  Chemistries  Recent Labs  Lab 03/25/19 1116  NA 137  K 4.0  CL 103  CO2 21*  GLUCOSE 118*  BUN 17  CREATININE 0.61  CALCIUM 9.4  AST 22  ALT 18  ALKPHOS 64  BILITOT 0.8   ------------------------------------------------------------------------------------------------------------------  Cardiac Enzymes No results for input(s): TROPONINI in the last 168 hours. ------------------------------------------------------------------------------------------------------------------  RADIOLOGY:  Ct Abdomen Pelvis W  Contrast  Result Date: 03/25/2019 CLINICAL DATA:  Abdominal pain EXAM: CT ABDOMEN AND PELVIS WITH CONTRAST TECHNIQUE: Multidetector CT imaging of the abdomen and pelvis was performed using the standard protocol following bolus administration of intravenous contrast. CONTRAST:  150mL OMNIPAQUE IOHEXOL 350 MG/ML SOLN COMPARISON:  None. FINDINGS: Lower chest: The visualized heart size within normal limits. No pericardial fluid/thickening. No hiatal hernia. The visualized portions of the lungs are clear. Hepatobiliary: The liver is normal in density without focal abnormality.The main portal vein is patent. No evidence of calcified gallstones, gallbladder wall thickening or biliary dilatation. Pancreas: Unremarkable. No pancreatic ductal dilatation or surrounding inflammatory changes. Spleen: Normal in size without focal abnormality. Adrenals/Urinary Tract: Both adrenal glands appear normal. The kidneys and collecting system appear normal without evidence of urinary tract calculus or hydronephrosis. Bladder is unremarkable. Stomach/Bowel: The stomach is normal in appearance. The duodenum and proximal jejunal loops air and fluid-filled and nondilated to the level mid jejunal loops. There is a gradual area of narrowing in the distal jejunal loops best seen on series 2, image 56. The distal jejunal loops are fluid and air-filled with dilatation. This extends to  the level the mid/distal ileal loops where there is fecalization and dilatation up to 3.7 cm. There is a focal area of narrowing seen within the right lower quadrant best seen on series 2, image 66 within the distal ileal loops. The distal ileal loops/terminal ileum appear to be decompressed. There is air and stool seen within the colon.The appendix is normal. Vascular/Lymphatic: There are no enlarged mesenteric, retroperitoneal, or pelvic lymph nodes. No significant vascular findings are present. Reproductive: The uterus and adnexa are unremarkable. Other: No  evidence of abdominal wall mass or hernia. Musculoskeletal: No acute or significant osseous findings. IMPRESSION: 1. Small-bowel obstruction originating at the mid jejunal loops with there is a gradual area of narrowing and extending to mid/distal ileal loops were there is also a gradual area of narrowing. This could be due to adhesions. 2. Fecalization seen within mid/distal ileal loops. Electronically Signed   By: Prudencio Pair M.D.   On: 03/25/2019 12:24    EKG:  No orders found for this or any previous visit.  IMPRESSION AND PLAN:   Small bowel obstruction- seen on CT abdomen/pelvis -Management per surgery -Patient is n.p.o. except for sips with meds -Pain control and IV antiemetics  Hypertension- pain likely contributing, although suspect that patient is hypertensive at baseline.  Not on any BP meds at home. -Start Lisinopril 10mg  daily and Norvasc 5mg  daily- increase doses as needed -Monitor kidney function closely with starting ACE -IV hydralazine prn  Right knee pain -Right knee x-rays have been ordered -Orthopedics have been consulted  Leukocytosis- likely related to SBO. No signs of active infection -Monitor  All the records are reviewed and case discussed with Consulting provider. Management plans discussed with the patient, family and they are in agreement.  CODE STATUS: Full  TOTAL TIME TAKING CARE OF THIS PATIENT: 40 minutes.    Berna Spare Natoya Viscomi M.D on 03/25/2019 at 3:29 PM  Between 7am to 6pm - Pager - [phone]  After 6pm go to [email or web address] - password [email or web address] [facility]  [facility]  Office  [phone]  CC: Primary care Physician: Rita Daring, PA-C   Note: This dictation was prepared with Dragon dictation along with smaller phrase technology. Any transcriptional errors that result from this process are unintentional.

## 2019-03-25 NOTE — Progress Notes (Signed)
RN notified MD pt started throwing up and complaining of abd pain. Per MD okay for RN to insert NG tube and place it to low interment suction. Also place order for X ray for NG placement and a KUB for tomorrow morning.

## 2019-03-25 NOTE — ED Notes (Signed)
Pt sitting in bed throwing up at this time, pt reports pain 9/10 in abdominal area. A&Ox4. Family member at bedside

## 2019-03-25 NOTE — ED Notes (Signed)
Pt states she had a hernia 8 years ago and the pain she feels is similar.

## 2019-03-25 NOTE — ED Notes (Signed)
Pt given more warm blankets as requested.

## 2019-03-25 NOTE — ED Notes (Signed)
Report given to 2C RN 

## 2019-03-25 NOTE — ED Notes (Signed)
Pt assisted to restroom. Pt complained of nausea, and abdominal pain. RN was notified, and pt is back in the bed resting with family at bedside.

## 2019-03-25 NOTE — ED Notes (Signed)
Pt otf for imaging 

## 2019-03-25 NOTE — ED Triage Notes (Signed)
Pt arrives with complaints of lower abdominal pain, nausea, and vomiting that started last night.

## 2019-03-25 NOTE — H&P (Signed)
[facility] SURGICAL HISTORY & PHYSICAL (cpt [phone])  HISTORY OF PRESENT ILLNESS (HPI):  58 y.o. female presented to [facility] ED today for abdominal pain. Patient reports the acute onset of abdominal pain, nausea, and emesis overnight. She reports feeling "discomfort" yesterday but this worsened overnight. She has had diffuse abdominal pain which is sharp and crampy. Multiple episodes of non-bloody, non-bilious emesis. Her symptoms are improved in the ED with analgesics and antiemetics. No other associated symptoms. She does have a history of hernia repair in 2011 and subsequent SBO a few months later. This presentation is similar to her previous SBO presentations. No additional abdominal surgeries. Of note, she has also been complaining of increasing right knee pain. 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 This has been getting worse secondary to gaining weight during quarantine. She has seen Dr Marry Guan for this in the past. Work up in the ED was concerning for small bowel obstruction.   General surgery is consulted by emergency medicine physician Dr Gilberto Better, MD for evaluation and management of small bowel obstruction.   PAST MEDICAL HISTORY (PMH):  History reviewed. No pertinent past medical history.  Reviewed. Otherwise negative.   PAST SURGICAL HISTORY ([place]):  Past Surgical History:  Procedure Laterality Date  . BREAST BIOPSY Left 10+ years ago   Negative core  . BREAST SURGERY  2002   biopsy  . CATARACT EXTRACTION Right 07/2014  . HERNIA REPAIR  02/21/2010  . TONSILLECTOMY AND ADENOIDECTOMY  1968    Reviewed. Otherwise negative.   MEDICATIONS:  Prior to Admission medications   Medication Sig Start Date End Date Taking? Authorizing Provider  acetaminophen (TYLENOL ARTHRITIS PAIN) 650 MG CR tablet Take 650 mg by mouth every 8 (eight) hours as needed for pain.    [provider]  acyclovir (ZOVIRAX) 400 MG tablet TAKE 1 TABLET BY MOUTH TWICE  DAILY 01/15/19   Mar Daring, PA-C  albuterol (PROAIR HFA) 108 (90 Base) MCG/ACT inhaler Inhale 1-2 puffs into the lungs every 6 (six) hours as needed for wheezing or shortness of breath. 2 puffs every 6 hours as needed 02/06/18   Mar Daring, PA-C  Misc Natural Products (OSTEO BI-FLEX JOINT SHIELD PO) Take 1 tablet by mouth daily.     [provider]  MULTIPLE VITAMIN PO Take 1 tablet by mouth daily.  09/14/06   [provider]  OMEGA-3 FATTY ACIDS PO Take 2 capsules by mouth daily. 09/14/06   [provider]  penciclovir (DENAVIR) 1 % cream DENAVIR, 1% (External Cream)  1 Cream apply every 2 hrs while awake for 4 days prn for 0 days  Quantity: 1.5;  Refills: 5   Ordered :14-Aug-2013  Margarita Rana MD;  Started 14-Aug-2013 Active 08/14/13   [provider]  Probiotic Product (PROBIOTIC ADVANCED PO) Take by mouth.    [provider]     ALLERGIES:  Allergies  Allergen Reactions  . Penicillins      SOCIAL HISTORY:  Social History   Socioeconomic History  . Marital status: Divorced    Spouse name: Not on file  . Number of children: Not on file  . Years of education: Not on file  . Highest education level: Not on file  Occupational History  . Not on file  Social Needs  . Financial resource strain: Not on file  . Food insecurity    Worry: Not on file    Inability: Not on file  . Transportation needs  Medical: Not on file    Non-medical: Not on file  Tobacco Use  . Smoking status: Former Smoker    Years: 5.00    Quit date: 07/03/1999    Years since quitting: 19.7  . Smokeless tobacco: Never Used  Substance and Sexual Activity  . Alcohol use: No  . Drug use: No  . Sexual activity: Not on file  Lifestyle  . Physical activity    Days per week: Not on file    Minutes per session: Not on file  . Stress: Not on file  Relationships  . Social Herbalist on phone: Not on file    Gets together: Not on  file    Attends religious service: Not on file    Active member of club or organization: Not on file    Attends meetings of clubs or organizations: Not on file    Relationship status: Not on file  . Intimate partner violence    Fear of current or ex partner: Not on file    Emotionally abused: Not on file    Physically abused: Not on file    Forced sexual activity: Not on file  Other Topics Concern  . Not on file  Social History Narrative  . Not on file     FAMILY HISTORY:  Family History  Problem Relation Age of Onset  . Crohn's disease Mother   . Thyroid disease Mother   . Rheum arthritis Mother   . Breast cancer Mother 59  . Hypertension Father   . Skin cancer Father   . Breast cancer Paternal Aunt     Otherwise negative.   REVIEW OF SYSTEMS:  Review of Systems  Constitutional: Negative for chills and fever.  HENT: Negative for congestion and sore throat.   Respiratory: Negative for cough and shortness of breath.   Cardiovascular: Negative for chest pain and palpitations.  Gastrointestinal: Positive for abdominal pain, nausea and vomiting. Negative for blood in stool, constipation and diarrhea.  Genitourinary: Negative for dysuria and urgency.  Neurological: Negative for dizziness and headaches.  All other systems reviewed and are negative.   VITAL SIGNS:  Temp:  [97.9 F (36.6 C)] 97.9 F (36.6 C) (09/22 1017) Pulse Rate:  [66-72] 66 (09/22 1252) Resp:  [17-20] 18 (09/22 1252) BP: (198-216)/(94-109) 198/96 (09/22 1252) SpO2:  [95 %-98 %] 96 % (09/22 1252) Weight:  [122.5 kg] 122.5 kg (09/22 1014)     Height: 5\' 8"  (172.7 cm) Weight: 122.5 kg BMI (Calculated): 41.06   PHYSICAL EXAM:  Physical Exam Vitals signs and nursing note reviewed.  Constitutional:      General: She is not in acute distress.    Appearance: She is well-developed. She is obese. She is not ill-appearing.  HENT:     Head: Normocephalic and atraumatic.  Eyes:     General: No scleral  icterus.    Extraocular Movements: Extraocular movements intact.  Cardiovascular:     Rate and Rhythm: Normal rate and regular rhythm.     Heart sounds: Normal heart sounds. No murmur. No friction rub. No gallop.   Pulmonary:     Effort: Pulmonary effort is normal. No respiratory distress.     Breath sounds: Normal breath sounds. No wheezing or rhonchi.  Abdominal:     General: Abdomen is protuberant. A surgical scar is present. There is no distension.     Palpations: Abdomen is soft.     Tenderness: There is generalized abdominal tenderness. There is no  guarding or rebound.     Hernia: No hernia is present.     Comments: Morbidly obese  Genitourinary:    Comments: Deferred Musculoskeletal:     Right knee: Tenderness found.  Skin:    General: Skin is warm and dry.     Coloration: Skin is not jaundiced or pale.  Neurological:     General: No focal deficit present.     Mental Status: She is alert and oriented to person, place, and time.  Psychiatric:        Mood and Affect: Mood normal.        Behavior: Behavior normal.     INTAKE/OUTPUT:  This shift: No intake/output data recorded.  Last 2 shifts: @IOLAST2SHIFTS @  Labs:  CBC Latest Ref Rng & Units 03/25/2019 08/20/2015 02/03/2011  WBC 4.0 - 10.5 K/uL 13.2(H) 7.6 10.8  Hemoglobin 12.0 - 15.0 g/dL 15.4(H) 13.1 15.0  Hematocrit 36.0 - 46.0 % 46.2(H) 40.1 44  Platelets 150 - 400 K/uL 317 371 292   CMP Latest Ref Rng & Units 03/25/2019 08/20/2015 03/01/2014  Glucose 70 - 99 mg/dL 118(H) 99 -  BUN 6 - 20 mg/dL 17 20 -  Creatinine 0.44 - 1.00 mg/dL 0.61 0.62 0.7  Sodium 135 - 145 mmol/L 137 140 -  Potassium 3.5 - 5.1 mmol/L 4.0 4.7 -  Chloride 98 - 111 mmol/L 103 102 -  CO2 22 - 32 mmol/L 21(L) 25 -  Calcium 8.9 - 10.3 mg/dL 9.4 9.5 -  Total Protein 6.5 - 8.1 g/dL 7.7 6.9 -  Total Bilirubin 0.3 - 1.2 mg/dL 0.8 0.2 -  Alkaline Phos 38 - 126 U/L 64 75 -  AST 15 - 41 U/L 22 22 -  ALT 0 - 44 U/L 18 18 -     Imaging studies:    CT Abdomen/Pelvis (03/25/2019) personally reviewed showing dilated loops of small bowel and radiologist report reviewed:   IMPRESSION: 1. Small-bowel obstruction originating at the mid jejunal loops with there is a gradual area of narrowing and extending to mid/distal ileal loops were there is also a gradual area of narrowing. This could be due to adhesions. 2. Fecalization seen within mid/distal ileal loops.   Assessment/Plan: (ICD-10's: K84.609) 58 y.o. female with small bowel obstruction likely secondary to adhesive disease following surgery, complicated by pertinent comorbidities including morbid obesity and right knee pain.    - Admit to general surgery  - NPO + IVF  - Will hold off on NGT for now. She understands that if she develops nausea, emesis, or worsening pain we will need to place this.   - No indication for emergent surgical intervention   - pain control prn; antiemetics prn  - monitor abdominal examination; on-going bowel function  - Will obtain XR R knee and discuss with orthopedics  - Mobilization encouraged if can be done safely.    - Medical management of comorbidities   - DVT prophylaxis  All of the above findings and recommendations were discussed with the patient, and all of her questions were answered to her expressed satisfaction.  -- Edison Simon, PA-C [facility] 03/25/2019, 1:05 PM [phone] M-F: 7am - 4pm

## 2019-03-25 NOTE — ED Provider Notes (Signed)
[facility] Emergency Department Provider Note   ____________________________________________   I have reviewed the triage vital signs and the nursing notes.   HISTORY  Chief Complaint Abdominal Pain   History limited by: Not Limited   HPI Rita Foster is a 58 y.o. female who presents to the emergency department today because of concerns for abdominal pain.  It started last night.  Located primarily in her lower abdomen.  It is located bilaterally.  The pain has been intermittent.  She states that the pain will be severe.  This has been accompanied by some nausea and vomiting.  She did have a couple small bowel movements last night.  The pain reminds her of when she had a hernia and then subsequent small bowel obstruction.  This occurred roughly 9 years ago.  Patient denies any fevers.   Records reviewed. Per medical record review patient has a history of hernia repair.   History reviewed. No pertinent past medical history.  Patient Active Problem List   Diagnosis Date Noted  . Absence of menstruation 04/02/2015  . BMI 37.0-37.9, adult 04/02/2015  . Hypercholesteremia 04/02/2015  . Symptomatic menopausal or female climacteric states 04/02/2015  . 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 Herpes 06/19/2008  . Adiposity 09/19/2006  . Asthma 09/14/2006  . Arthralgia of lower leg 09/14/2006    Past Surgical History:  Procedure Laterality Date  . BREAST BIOPSY Left 10+ years ago   Negative core  . BREAST SURGERY  2002   biopsy  . CATARACT EXTRACTION Right 07/2014  . HERNIA REPAIR  02/21/2010  . TONSILLECTOMY AND ADENOIDECTOMY  1968    Prior to Admission medications   Medication Sig Start Date End Date Taking? Authorizing Provider  acetaminophen (TYLENOL ARTHRITIS PAIN) 650 MG CR tablet Take 650 mg by mouth every 8 (eight) hours as needed for pain.    [provider]  acyclovir (ZOVIRAX) 400 MG tablet TAKE 1  TABLET BY MOUTH TWICE DAILY 01/15/19   Mar Daring, PA-C  albuterol (PROAIR HFA) 108 (90 Base) MCG/ACT inhaler Inhale 1-2 puffs into the lungs every 6 (six) hours as needed for wheezing or shortness of breath. 2 puffs every 6 hours as needed 02/06/18   Mar Daring, PA-C  Misc Natural Products (OSTEO BI-FLEX JOINT SHIELD PO) Take 1 tablet by mouth daily.     [provider]  MULTIPLE VITAMIN PO Take 1 tablet by mouth daily.  09/14/06   [provider]  OMEGA-3 FATTY ACIDS PO Take 2 capsules by mouth daily. 09/14/06   [provider]  penciclovir (DENAVIR) 1 % cream DENAVIR, 1% (External Cream)  1 Cream apply every 2 hrs while awake for 4 days prn for 0 days  Quantity: 1.5;  Refills: 5   Ordered :14-Aug-2013  Margarita Rana MD;  Started 14-Aug-2013 Active 08/14/13   [provider]  Probiotic Product (PROBIOTIC ADVANCED PO) Take by mouth.    [provider]    Allergies Penicillins  Family History  Problem Relation Age of Onset  . Crohn's disease Mother   . Thyroid disease Mother   . Rheum arthritis Mother   . Breast cancer Mother 80  . Hypertension Father   . Skin cancer Father   . Breast cancer Paternal Aunt     Social History Social History   Tobacco Use  . Smoking status: Former Smoker    Years: 5.00    Quit date: 07/03/1999    Years  since quitting: 19.7  . Smokeless tobacco: Never Used  Substance Use Topics  . Alcohol use: No  . Drug use: No    Review of Systems Constitutional: No fever/chills Eyes: No visual changes. ENT: No sore throat. Cardiovascular: Denies chest pain. Respiratory: Denies shortness of breath. Gastrointestinal: Positive for abdominal pain, nausea and vomiting.  Genitourinary: Negative for dysuria. Musculoskeletal: Negative for back pain. Skin: Negative for rash. Neurological: Negative for headaches, focal weakness or  numbness.  ____________________________________________   PHYSICAL EXAM:  VITAL SIGNS: ED Triage Vitals  Enc Vitals Group     BP 03/25/19 1017 (!) 199/94     Pulse Rate 03/25/19 1017 72     Resp 03/25/19 1017 17     Temp 03/25/19 1017 97.9 F (36.6 C)     Temp Source 03/25/19 1017 Oral     SpO2 03/25/19 1017 97 %     Weight 03/25/19 1014 270 lb (122.5 kg)     Height 03/25/19 1014 5\' 8"  (1.727 m)     Head Circumference --      Peak Flow --      Pain Score 03/25/19 1014 8    Constitutional: Alert and oriented.  Eyes: Conjunctivae are normal.  ENT      Head: Normocephalic and atraumatic.      Nose: No congestion/rhinnorhea.      Mouth/Throat: Mucous membranes are moist.      Neck: No stridor. Hematological/Lymphatic/Immunilogical: No cervical lymphadenopathy. Cardiovascular: Normal rate, regular rhythm.  No murmurs, rubs, or gallops.  Respiratory: Normal respiratory effort without tachypnea nor retractions. Breath sounds are clear and equal bilaterally. No wheezes/rales/rhonchi. Gastrointestinal: Positive for abdominal tenderness to lower abdomen.  Genitourinary: Deferred Musculoskeletal: Normal range of motion in all extremities. No lower extremity edema. Neurologic:  Normal speech and language. No gross focal neurologic deficits are appreciated.  Skin:  Skin is warm, dry and intact. No rash noted. Psychiatric: Mood and affect are normal. Speech and behavior are normal. Patient exhibits appropriate insight and judgment.  ____________________________________________    LABS (pertinent positives/negatives)  Lipase 23 CBC wbc 13.2, hgb 15.4, plt 317 CMP wnl except co2 21, glu 118 UA hazy, ketones 20, trace leukocytes, 0-5 rbc and wbc  ____________________________________________   EKG  None  ____________________________________________    RADIOLOGY  CT  abd/pel SBO   ____________________________________________   PROCEDURES  Procedures  ____________________________________________   INITIAL IMPRESSION / ASSESSMENT AND PLAN / ED COURSE  Pertinent labs & imaging results that were available during my care of the patient were reviewed by me and considered in my medical decision making (see chart for details).   Patient presented to the emergency department today because of concerns for abdominal pain, nausea and vomiting.  Patient does have a history of small bowel obstruction.  CT scan today is consistent with small bowel obstruction again.  Discussed this finding with the patient.  Discussed with surgery who will plan on admission.  ___________________________________________   FINAL CLINICAL IMPRESSION(S) / ED DIAGNOSES  Final diagnoses:  SBO (small bowel obstruction) ([place])  Abdominal pain, unspecified abdominal location     Note: This dictation was prepared with Dragon dictation. Any transcriptional errors that result from this process are unintentional     Nance Pear, MD 03/25/19 1252

## 2019-03-26 ENCOUNTER — Inpatient Hospital Stay: Payer: Managed Care, Other (non HMO)

## 2019-03-26 LAB — CBC
HCT: 40.6 % (ref 36.0–46.0)
Hemoglobin: 13.2 g/dL (ref 12.0–15.0)
MCH: 29.5 pg (ref 26.0–34.0)
MCHC: 32.5 g/dL (ref 30.0–36.0)
MCV: 90.8 fL (ref 80.0–100.0)
Platelets: 285 10*3/uL (ref 150–400)
RBC: 4.47 MIL/uL (ref 3.87–5.11)
RDW: 13.2 % (ref 11.5–15.5)
WBC: 7.2 10*3/uL (ref 4.0–10.5)
nRBC: 0 % (ref 0.0–0.2)

## 2019-03-26 LAB — BASIC METABOLIC PANEL
Anion gap: 10 (ref 5–15)
BUN: 22 mg/dL — ABNORMAL HIGH (ref 6–20)
CO2: 24 mmol/L (ref 22–32)
Calcium: 8.8 mg/dL — ABNORMAL LOW (ref 8.9–10.3)
Chloride: 104 mmol/L (ref 98–111)
Creatinine, Ser: 0.53 mg/dL (ref 0.44–1.00)
GFR calc Af Amer: >60 mL/min (ref >60)
GFR calc non Af Amer: >60 mL/min (ref >60)
Glucose, Bld: 159 mg/dL — ABNORMAL HIGH (ref 70–99)
Potassium: 3.5 mmol/L (ref 3.5–5.1)
Sodium: 138 mmol/L (ref 135–145)

## 2019-03-26 LAB — NOVEL CORONAVIRUS, NAA (HOSP ORDER, SEND-OUT TO REF LAB; TAT 18-24 HRS): SARS-CoV-2, NAA: NOT DETECTED

## 2019-03-26 MED ORDER — ACETAMINOPHEN 325 MG PO TABS: 650.0000 mg | ORAL_TABLET | Freq: Four times a day (QID) | ORAL | Status: DC | PRN

## 2019-03-26 MED ORDER — ACYCLOVIR 200 MG PO CAPS
800.0000 mg | ORAL_CAPSULE | Freq: Every day | ORAL | Status: DC
  Administered 2019-03-28: 800 mg via ORAL
  Filled 2019-03-26 (×2): qty 4

## 2019-03-26 MED ORDER — ENOXAPARIN SODIUM 40 MG/0.4ML ~~LOC~~ SOLN
40.0000 mg | Freq: Two times a day (BID) | SUBCUTANEOUS | Status: DC
  Administered 2019-03-26 – 2019-03-28 (×4): 40 mg via SUBCUTANEOUS
  Filled 2019-03-26 (×4): qty 0.4

## 2019-03-26 MED ORDER — ACETAMINOPHEN 650 MG RE SUPP
650.0000 mg | Freq: Four times a day (QID) | RECTAL | Status: DC | PRN
  Administered 2019-03-26: 21:00:00 650 mg via RECTAL
  Filled 2019-03-26: qty 1

## 2019-03-26 MED ORDER — ACYCLOVIR 200 MG PO CAPS
800.0000 mg | ORAL_CAPSULE | Freq: Every day | ORAL | Status: DC
  Filled 2019-03-26: qty 4

## 2019-03-26 MED ORDER — ACETAMINOPHEN 650 MG RE SUPP
650.0000 mg | Freq: Four times a day (QID) | RECTAL | Status: DC
  Administered 2019-03-26: 650 mg via RECTAL
  Filled 2019-03-26: qty 1

## 2019-03-26 MED ORDER — DIATRIZOATE MEGLUMINE & SODIUM 66-10 % PO SOLN
90.0000 mL | Freq: Once | ORAL | Status: AC
  Administered 2019-03-26: 90 mL via NASOGASTRIC

## 2019-03-26 NOTE — Progress Notes (Signed)
[facility] SURGICAL PROGRESS NOTE (cpt [phone])  Hospital Day(s): 1.   Interval History: Patient seen and examined, she had issues with abdominal pain and nausea/emesis early in the evening yesterday. She had NGT placed following this. This morning, she reports she is feeling a little better. No pain, nausea, or emesis this morning. No flatus. She has had about 350 ccs out since NGT placement. KUB unchanged. No other acute or new issues. Still with uncontrolled HTN.   Review of Systems:  Constitutional: denies fever, chills  HEENT: denies cough or congestion  Respiratory: denies any shortness of breath  Cardiovascular: denies chest pain or palpitations  Gastrointestinal: denies abdominal pain, N/V, or diarrhea/and bowel function as per interval history Genitourinary: denies burning with urination or urinary frequency   Vital signs in last 24 hours: [min-max] current  Temp:  [97.9 F (36.6 C)-99 F (37.2 C)] 98.9 F (37.2 C) (09/23 0448) Pulse Rate:  [66-100] 72 (09/23 0448) Resp:  [16-20] 16 (09/23 0448) BP: (138-216)/(50-120) 138/50 (09/23 0448) SpO2:  [95 %-99 %] 97 % (09/23 0448) Weight:  [122.5 kg] 122.5 kg (09/22 1014)     Height: 5\' 8"  (172.7 cm) Weight: 122.5 kg BMI (Calculated): 41.06   Intake/Output last 2 shifts:  09/22 0701 - 09/23 0700 In: 1139.8 [I.V.:1139.8] Out: 550 [Urine:200; Emesis/NG output:350]   Physical Exam:  Constitutional: alert, cooperative and no distress  HENT: normocephalic without obvious abnormality, NGT in place  Eyes: PERRL, EOM's grossly intact and symmetric  Respiratory: breathing non-labored at rest  Cardiovascular: regular rate and sinus rhythm  Gastrointestinal: obese, soft, non-tender, and non-distended Musculoskeletal: no edema or wounds, motor and sensation grossly intact, NT    Labs:  CBC Latest Ref Rng & Units 03/26/2019 03/25/2019 08/20/2015  WBC 4.0 - 10.5 K/uL 7.2 13.2(H) 7.6  Hemoglobin 12.0 - 15.0 g/dL 13.2  15.4(H) 13.1  Hematocrit 36.0 - 46.0 % 40.6 46.2(H) 40.1  Platelets 150 - 400 K/uL 285 317 371   CMP Latest Ref Rng & Units 03/26/2019 03/25/2019 08/20/2015  Glucose 70 - 99 mg/dL 159(H) 118(H) 99  BUN 6 - 20 mg/dL 22(H) 17 20  Creatinine 0.44 - 1.00 mg/dL 0.53 0.61 0.62  Sodium 135 - 145 mmol/L 138 137 140  Potassium 3.5 - 5.1 mmol/L 3.5 4.0 4.7  Chloride 98 - 111 mmol/L 104 103 102  CO2 22 - 32 mmol/L 24 21(L) 25  Calcium 8.9 - 10.3 mg/dL 8.8(L) 9.4 9.5  Total Protein 6.5 - 8.1 g/dL - 7.7 6.9  Total Bilirubin 0.3 - 1.2 mg/dL - 0.8 0.2  Alkaline Phos 38 - 126 U/L - 64 75  AST 15 - 41 U/L - 22 22  ALT 0 - 44 U/L - 18 18     Imaging studies:   KUB (03/26/2019) personally reviewed still with dilated loops of small bowel and radiologist report reviewed:  IMPRESSION: 1. Small bowel obstruction by CT with unchanged radiographic appearance. 2. Nasogastric tube in place.   Assessment/Plan: (ICD-10's: K50.609) 58 y.o. female with persistent clinical small bowel obstruction without obvious return of bowel function, complicated by uncontrolled HTN.    - NPO + IVF  - Continue NGT decompression; monitor output   - pain control prn; antiemetics prn  - monitor abdominal examination; on-going bowel function   - Will get KUB with contrast and 8 & 24 hour delay films to reassess obstruction.   - Mobilization encouraged if can be done safely.               -  Medical management of comorbidities; appreciate medicine assistance with HTN              - DVT prophylaxis   All of the above findings and recommendations were discussed with the patient, and the medical team, and all of patient's questions were answered to her expressed satisfaction.   -- Edison Simon, PA-C [facility] 03/26/2019, 7:50 AM [phone] M-F: 7am - 4pm

## 2019-03-26 NOTE — Progress Notes (Signed)
[place] at [facility]                                                                                                                                                                                  Patient Demographics   Chikaima Hjerpe, is a 58 y.o. female, DOB - 1960-07-25, BQ:[id number]  Admit date - 03/25/2019   Admitting Physician Jules Husbands, MD  Outpatient Primary MD for the patient is Mar Daring, PA-C   LOS - 1  Subjective: Patient states that her abdominal discomfort is better.  She states that she had a little bowel movement and also passed gas.    Review of Systems:   CONSTITUTIONAL: No documented fever. No fatigue, weakness. No weight gain, no weight loss.  EYES: No blurry or double vision.  ENT: No tinnitus. No postnasal drip. No redness of the oropharynx.  RESPIRATORY: No cough, no wheeze, no hemoptysis. No dyspnea.  CARDIOVASCULAR: No chest pain. No orthopnea. No palpitations. No syncope.  GASTROINTESTINAL: No nausea, no vomiting or diarrhea. No abdominal pain. No melena or hematochezia.  GENITOURINARY: No dysuria or hematuria.  ENDOCRINE: No polyuria or nocturia. No heat or cold intolerance.  HEMATOLOGY: No anemia. No bruising. No bleeding.  INTEGUMENTARY: No rashes. No lesions.  MUSCULOSKELETAL: No arthritis. No swelling. No gout.  NEUROLOGIC: No numbness, tingling, or ataxia. No seizure-type activity.  PSYCHIATRIC: No anxiety. No insomnia. No ADD.    Vitals:   Vitals:   03/26/19 0008 03/26/19 0112 03/26/19 0448 03/26/19 1209  BP: (!) 165/79 (!) 149/77 (!) 138/50 (!) 155/77  Pulse: 83 82 72 70  Resp: 18  16 16   Temp: 98.9 F (37.2 C)  98.9 F (37.2 C) 98 F (36.7 C)  TempSrc: Oral  Oral Oral  SpO2: 97%  97% 95%  Weight:      Height:        Wt Readings from Last 3 Encounters:  03/25/19 122.5 kg  02/06/18 109.8 kg  08/22/16 96.5 kg     Intake/Output Summary (Last 24 hours) at 03/26/2019 1444 Last data  filed at 03/26/2019 0900 Gross per 24 hour  Intake 1379.8 ml  Output 550 ml  Net 829.8 ml    Physical Exam:   GENERAL: Pleasant-appearing in no apparent distress.  HEAD, EYES, EARS, NOSE AND THROAT: Atraumatic, normocephalic. Extraocular muscles are intact. Pupils equal and reactive to light. Sclerae anicteric. No conjunctival injection. No oro-pharyngeal erythema.  NECK: Supple. There is no jugular venous distention. No bruits, no lymphadenopathy, no thyromegaly.  HEART: Regular rate and rhythm,. No murmurs, no rubs, no clicks.  LUNGS: Clear to auscultation bilaterally. No rales or rhonchi. No wheezes.  ABDOMEN: Soft, flat, nontender, nondistended. Has good bowel sounds. No hepatosplenomegaly appreciated.  EXTREMITIES: No evidence of any cyanosis, clubbing, or peripheral edema.  +2 pedal and radial pulses bilaterally.  NEUROLOGIC: The patient is alert, awake, and oriented x3 with no focal motor or sensory deficits appreciated bilaterally.  SKIN: Moist and warm with no rashes appreciated.  Psych: Not anxious, depressed LN: No inguinal LN enlargement    Antibiotics   Anti-infectives (From admission, onward)   Start     Dose/Rate Route Frequency Ordered Stop   03/27/19 1000  acyclovir (ZOVIRAX) 200 MG capsule 800 mg     800 mg Oral Daily 03/26/19 1434     03/26/19 1445  acyclovir (ZOVIRAX) 200 MG capsule 800 mg  Status:  Discontinued     800 mg Oral Daily 03/26/19 1431 03/26/19 1434      Medications   Scheduled Meds: . acetaminophen  650 mg Rectal Q6H  . acetaminophen  1,000 mg Oral Q6H  . [START ON 03/27/2019] acyclovir  800 mg Oral Daily  . amLODipine  5 mg Oral Daily  . enoxaparin (LOVENOX) injection  40 mg Subcutaneous Q12H  . influenza vac split quadrivalent PF  0.5 mL Intramuscular Tomorrow-1000  . ketorolac  30 mg Intravenous Q6H  . lisinopril  10 mg Oral Daily   Continuous Infusions: . dextrose 5% lactated ringers 100 mL/hr at 03/26/19 1144   PRN Meds:.hydrALAZINE,  ketorolac **FOLLOWED BY** [START ON 03/30/2019] ketorolac, labetalol, morphine injection, ondansetron **OR** ondansetron (ZOFRAN) IV, phenol   Data Review:   Micro Results No results found for this or any previous visit (from the past 240 hour(s)).  Radiology Reports Dg Abd 1 View  Result Date: 03/25/2019 CLINICAL DATA:  Ng tube placement EXAM: ABDOMEN - 1 VIEW COMPARISON:  CT from earlier same day FINDINGS: Gastric tube loops in the gastric fundus. The stomach is nondistended. A few gas distended small bowel loops in the mid abdomen. Residual contrast material in the renal collecting systems. IMPRESSION: Gastric tube in the  fundus. Electronically Signed   By: Lucrezia Europe M.D.   On: 03/25/2019 19:03   Ct Abdomen Pelvis W Contrast  Result Date: 03/25/2019 CLINICAL DATA:  Abdominal pain EXAM: CT ABDOMEN AND PELVIS WITH CONTRAST TECHNIQUE: Multidetector CT imaging of the abdomen and pelvis was performed using the standard protocol following bolus administration of intravenous contrast. CONTRAST:  142mL OMNIPAQUE IOHEXOL 350 MG/ML SOLN COMPARISON:  None. FINDINGS: Lower chest: The visualized heart size within normal limits. No pericardial fluid/thickening. No hiatal hernia. The visualized portions of the lungs are clear. Hepatobiliary: The liver is normal in density without focal abnormality.The main portal vein is patent. No evidence of calcified gallstones, gallbladder wall thickening or biliary dilatation. Pancreas: Unremarkable. No pancreatic ductal dilatation or surrounding inflammatory changes. Spleen: Normal in size without focal abnormality. Adrenals/Urinary Tract: Both adrenal glands appear normal. The kidneys and collecting system appear normal without evidence of urinary tract calculus or hydronephrosis. Bladder is unremarkable. Stomach/Bowel: The stomach is normal in appearance. The duodenum and proximal jejunal loops air and fluid-filled and nondilated to the level mid jejunal loops. There is  a gradual area of narrowing in the distal jejunal loops best seen on series 2, image 56. The distal jejunal loops are fluid and air-filled with dilatation. This extends to the level the mid/distal ileal loops where there is fecalization and dilatation up to 3.7 cm. There is a focal area of narrowing seen within the right lower quadrant best seen on series 2,  image 66 within the distal ileal loops. The distal ileal loops/terminal ileum appear to be decompressed. There is air and stool seen within the colon.The appendix is normal. Vascular/Lymphatic: There are no enlarged mesenteric, retroperitoneal, or pelvic lymph nodes. No significant vascular findings are present. Reproductive: The uterus and adnexa are unremarkable. Other: No evidence of abdominal wall mass or hernia. Musculoskeletal: No acute or significant osseous findings. IMPRESSION: 1. Small-bowel obstruction originating at the mid jejunal loops with there is a gradual area of narrowing and extending to mid/distal ileal loops were there is also a gradual area of narrowing. This could be due to adhesions. 2. Fecalization seen within mid/distal ileal loops. Electronically Signed   By: Prudencio Pair M.D.   On: 03/25/2019 12:24   Dg Knee Complete 4 Views Right  Result Date: 03/25/2019 CLINICAL DATA:  Bilateral knee pain without injury. EXAM: RIGHT KNEE - COMPLETE 4+ VIEW COMPARISON:  None. FINDINGS: No evidence of fracture, dislocation, or joint effusion. Severe narrowing of medial joint space is noted. Moderate narrowing of patellofemoral space is noted with osteophyte formation. Moderate narrowing is noted medially. Soft tissues are unremarkable. 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 No acute abnormality seen in the right knee. Electronically Signed   By: Marijo Conception M.D.   On: 03/25/2019 16:53   Dg Abd Portable 2v  Result Date: 03/26/2019 CLINICAL DATA:  Small bowel obstruction EXAM: PORTABLE ABDOMEN -  2 VIEW COMPARISON:  CT earlier today FINDINGS: Nasogastric tube with tip at the fundus. Gas dilated loops of bowel in the upper and lower abdomen stable from prior scout image. No concerning mass effect or gas collection IMPRESSION: 1. Small bowel obstruction by CT with unchanged radiographic appearance. 2. Nasogastric tube in place. Electronically Signed   By: Monte Fantasia M.D.   On: 03/26/2019 06:23     CBC Recent Labs  Lab 03/25/19 1116 03/26/19 0329  WBC 13.2* 7.2  HGB 15.4* 13.2  HCT 46.2* 40.6  PLT 317 285  MCV 89.7 90.8  MCH 29.9 29.5  MCHC 33.3 32.5  RDW 13.0 13.2  LYMPHSABS 1.0  --   MONOABS 0.5  --   EOSABS 0.0  --   BASOSABS 0.0  --     Chemistries  Recent Labs  Lab 03/25/19 1116 03/26/19 0329  NA 137 138  K 4.0 3.5  CL 103 104  CO2 21* 24  GLUCOSE 118* 159*  BUN 17 22*  CREATININE 0.61 0.53  CALCIUM 9.4 8.8*  AST 22  --   ALT 18  --   ALKPHOS 64  --   BILITOT 0.8  --    ------------------------------------------------------------------------------------------------------------------ estimated creatinine clearance is 105.6 mL/min (by C-G formula based on SCr of 0.53 mg/dL). ------------------------------------------------------------------------------------------------------------------ No results for input(s): HGBA1C in the last 72 hours. ------------------------------------------------------------------------------------------------------------------ No results for input(s): CHOL, HDL, LDLCALC, TRIG, CHOLHDL, LDLDIRECT in the last 72 hours. ------------------------------------------------------------------------------------------------------------------ No results for input(s): TSH, T4TOTAL, T3FREE, THYROIDAB in the last 72 hours.  Invalid input(s): FREET3 ------------------------------------------------------------------------------------------------------------------ No results for input(s): VITAMINB12, FOLATE, FERRITIN, TIBC, IRON, RETICCTPCT in  the last 72 hours.  Coagulation profile No results for input(s): INR, PROTIME in the last 168 hours.  No results for input(s): DDIMER in the last 72 hours.  Cardiac Enzymes No results for input(s): CKMB, TROPONINI, MYOGLOBIN in the last 168 hours.  Invalid input(s): CK ------------------------------------------------------------------------------------------------------------------ Invalid input(s): POCBNP    Assessment & Plan   Small bowel obstruction- seen on CT abdomen/pelvis -Management per surgery -  Patient is n.p.o. except for sips with meds -Pain control and IV antiemetics  Hypertension- Blood pressures improved continue amlodipine and lisinopril   Right knee pain -Right knee x-rays show severe osteoarthritis Continue pain control   Leukocytosis- likely related to SBO. No signs of active infection -WBC count is normal     Code Status Orders  (From admission, onward)         Start     Ordered   03/25/19 1533  Full code  Continuous     03/25/19 1532        Code Status History    This patient has a current code status but no historical code status.   Advance Care Planning Activity           Consults  none  DVT Prophylaxis Lovenox  Lab Results  Component Value Date   PLT 285 03/26/2019     Time Spent in minutes 35-minute greater than 50% of time spent in care coordination and counseling patient regarding the condition and plan of care.   Dustin Flock M.D on 03/26/2019 at 2:44 PM  Between 7am to 6pm - Pager - [phone]  After 6pm go to [email or web address] - Proofreader  [employer]   Office  [phone]

## 2019-03-26 NOTE — Progress Notes (Signed)
PHARMACIST - PHYSICIAN COMMUNICATION  CONCERNING:  Enoxaparin (Lovenox) for DVT Prophylaxis    RECOMMENDATION: Patient was prescribed enoxaprin 40mg  q24 hours for VTE prophylaxis.   Filed Weights   03/25/19 1014  Weight: 270 lb (122.5 kg)    Body mass index is 41.05 kg/m.  Estimated Creatinine Clearance: 105.6 mL/min (by C-G formula based on SCr of 0.53 mg/dL).  Based on [place] patient is candidate for enoxaparin 40mg  every 12 hour dosing due to BMI being >40.  DESCRIPTION: Pharmacy has adjusted enoxaparin dose per [facility] policy.  Patient is now receiving enoxaparin 40mg  every 12 hours.   Lu Duffel, PharmD, BCPS Clinical Pharmacist 03/26/2019 1:56 PM

## 2019-03-27 LAB — HIV ANTIBODY (ROUTINE TESTING W REFLEX): HIV Screen 4th Generation wRfx: NONREACTIVE

## 2019-03-27 MED ORDER — LISINOPRIL 20 MG PO TABS
20.0000 mg | ORAL_TABLET | Freq: Every day | ORAL | Status: DC
  Administered 2019-03-28: 10:00:00 20 mg via ORAL
  Filled 2019-03-27: qty 1

## 2019-03-27 MED ORDER — AMLODIPINE BESYLATE 10 MG PO TABS
10.0000 mg | ORAL_TABLET | Freq: Every day | ORAL | Status: DC
  Administered 2019-03-28: 10 mg via ORAL
  Filled 2019-03-27: qty 1

## 2019-03-27 NOTE — Progress Notes (Signed)
[place] at [facility]                                                                                                                                                                                  Patient Demographics   Rita Foster, is a 58 y.o. female, DOB - 05/31/1961, [id number]  Admit date - 03/25/2019   Admitting Physician Jules Husbands, MD  Outpatient Primary MD for the patient is Mar Daring, PA-C   LOS - 2  Subjective: Patient abdominal pain improved.  NG tube is out.  Blood pressures elevated..    Review of Systems:   CONSTITUTIONAL: No documented fever. No fatigue, weakness. No weight gain, no weight loss.  EYES: No blurry or double vision.  ENT: No tinnitus. No postnasal drip. No redness of the oropharynx.  RESPIRATORY: No cough, no wheeze, no hemoptysis. No dyspnea.  CARDIOVASCULAR: No chest pain. No orthopnea. No palpitations. No syncope.  GASTROINTESTINAL: No nausea, no vomiting or diarrhea. No abdominal pain. No melena or hematochezia.  GENITOURINARY: No dysuria or hematuria.  ENDOCRINE: No polyuria or nocturia. No heat or cold intolerance.  HEMATOLOGY: No anemia. No bruising. No bleeding.  INTEGUMENTARY: No rashes. No lesions.  MUSCULOSKELETAL: No arthritis. No swelling. No gout.  NEUROLOGIC: No numbness, tingling, or ataxia. No seizure-type activity.  PSYCHIATRIC: No anxiety. No insomnia. No ADD.    Vitals:   Vitals:   03/26/19 2205 03/27/19 0346 03/27/19 1017 03/27/19 1341  BP: (!) 150/70 (!) 155/82 (!) 186/76 (!) 162/79  Pulse: 87 70  74  Resp:  16  20  Temp:  98.8 F (37.1 C)  98.4 F (36.9 C)  TempSrc:  Oral  Oral  SpO2: 97% 97%  98%  Weight:      Height:        Wt Readings from Last 3 Encounters:  03/25/19 122.5 kg  02/06/18 109.8 kg  08/22/16 96.5 kg     Intake/Output Summary (Last 24 hours) at 03/27/2019 1441 Last data filed at 03/27/2019 0900 Gross per 24 hour  Intake 1824.85 ml  Output  950 ml  Net 874.85 ml    Physical Exam:   GENERAL: Pleasant-appearing in no apparent distress.  HEAD, EYES, EARS, NOSE AND THROAT: Atraumatic, normocephalic. Extraocular muscles are intact. Pupils equal and reactive to light. Sclerae anicteric. No conjunctival injection. No oro-pharyngeal erythema.  NECK: Supple. There is no jugular venous distention. No bruits, no lymphadenopathy, no thyromegaly.  HEART: Regular rate and rhythm,. No murmurs, no rubs, no clicks.  LUNGS: Clear to auscultation bilaterally. No rales or rhonchi. No wheezes.  ABDOMEN: Soft, flat, nontender, nondistended. Has good bowel sounds. No hepatosplenomegaly  appreciated.  EXTREMITIES: No evidence of any cyanosis, clubbing, or peripheral edema.  +2 pedal and radial pulses bilaterally.  NEUROLOGIC: The patient is alert, awake, and oriented x3 with no focal motor or sensory deficits appreciated bilaterally.  SKIN: Moist and warm with no rashes appreciated.  Psych: Not anxious, depressed LN: No inguinal LN enlargement    Antibiotics   Anti-infectives (From admission, onward)   Start     Dose/Rate Route Frequency Ordered Stop   03/27/19 1000  acyclovir (ZOVIRAX) 200 MG capsule 800 mg     800 mg Oral Daily 03/26/19 1434     03/26/19 1445  acyclovir (ZOVIRAX) 200 MG capsule 800 mg  Status:  Discontinued     800 mg Oral Daily 03/26/19 1431 03/26/19 1434      Medications   Scheduled Meds: . acyclovir  800 mg Oral Daily  . [START ON 03/28/2019] amLODipine  10 mg Oral Daily  . enoxaparin (LOVENOX) injection  40 mg Subcutaneous Q12H  . ketorolac  30 mg Intravenous Q6H  . [START ON 03/28/2019] lisinopril  20 mg Oral Daily   Continuous Infusions: . dextrose 5% lactated ringers 100 mL/hr at 03/27/19 0759   PRN Meds:.acetaminophen **OR** acetaminophen, hydrALAZINE, ketorolac **FOLLOWED BY** [START ON 03/30/2019] ketorolac, labetalol, morphine injection, ondansetron **OR** ondansetron (ZOFRAN) IV, phenol   Data Review:    Micro Results Recent Results (from the past 240 hour(s))  Novel Coronavirus, NAA (Hosp order, Send-out to [facility]; TAT 18-24 hrs     Status: None   Collection Time: 03/25/19 12:53 PM   Specimen: Nasopharyngeal Swab; Respiratory  Result Value Ref Range Status   SARS-CoV-2, NAA NOT DETECTED NOT DETECTED Final    Comment: (NOTE) This nucleic acid amplification test was developed and its performance characteristics determined by [employer]. Nucleic acid amplification tests include PCR and TMA. This test has not been FDA cleared or approved. This test has been authorized by FDA under an Emergency Use Authorization (EUA). This test is only authorized for the duration of time the declaration that circumstances exist justifying the authorization of the emergency use of in vitro diagnostic tests for detection of SARS-CoV-2 virus and/or diagnosis of COVID-19 infection under section 564(b)(1) of the Act, 21 U.S.C. [id number]) (1), unless the authorization is terminated or revoked sooner. When diagnostic testing is negative, the possibility of a false negative result should be considered in the context of a patient's recent exposures and the presence of clinical signs and symptoms consistent with COVID-19. An individual without symptoms of COVID- 19 and who is not shedding SARS-CoV-2 vi rus would expect to have a negative (not detected) result in this assay. Performed At: [facility] [address] [place], [place] [id number] Rush Farmer MD [id number]    [place]  Final    Comment: Performed at [facility], [address] [place], [place] [address]    Radiology Reports Dg Abd 1 View  Result Date: 03/25/2019 CLINICAL DATA:  Ng tube placement EXAM: ABDOMEN - 1 VIEW COMPARISON:  CT from earlier same day FINDINGS: Gastric tube loops in the gastric fundus. The stomach is nondistended. A few gas distended small bowel loops in the  mid abdomen. Residual contrast material in the renal collecting systems. IMPRESSION: Gastric tube in the  fundus. Electronically Signed   By: Lucrezia Europe M.D.   On: 03/25/2019 19:03   Ct Abdomen Pelvis W Contrast  Result Date: 03/25/2019 CLINICAL DATA:  Abdominal pain EXAM: CT ABDOMEN AND PELVIS WITH CONTRAST  TECHNIQUE: Multidetector CT imaging of the abdomen and pelvis was performed using the standard protocol following bolus administration of intravenous contrast. CONTRAST:  143mL OMNIPAQUE IOHEXOL 350 MG/ML SOLN COMPARISON:  None. FINDINGS: Lower chest: The visualized heart size within normal limits. No pericardial fluid/thickening. No hiatal hernia. The visualized portions of the lungs are clear. Hepatobiliary: The liver is normal in density without focal abnormality.The main portal vein is patent. No evidence of calcified gallstones, gallbladder wall thickening or biliary dilatation. Pancreas: Unremarkable. No pancreatic ductal dilatation or surrounding inflammatory changes. Spleen: Normal in size without focal abnormality. Adrenals/Urinary Tract: Both adrenal glands appear normal. The kidneys and collecting system appear normal without evidence of urinary tract calculus or hydronephrosis. Bladder is unremarkable. Stomach/Bowel: The stomach is normal in appearance. The duodenum and proximal jejunal loops air and fluid-filled and nondilated to the level mid jejunal loops. There is a gradual area of narrowing in the distal jejunal loops best seen on series 2, image 56. The distal jejunal loops are fluid and air-filled with dilatation. This extends to the level the mid/distal ileal loops where there is fecalization and dilatation up to 3.7 cm. There is a focal area of narrowing seen within the right lower quadrant best seen on series 2, image 66 within the distal ileal loops. The distal ileal loops/terminal ileum appear to be decompressed. There is air and stool seen within the colon.The appendix is normal.  Vascular/Lymphatic: There are no enlarged mesenteric, retroperitoneal, or pelvic lymph nodes. No significant vascular findings are present. Reproductive: The uterus and adnexa are unremarkable. Other: No evidence of abdominal wall mass or hernia. Musculoskeletal: No acute or significant osseous findings. IMPRESSION: 1. Small-bowel obstruction originating at the mid jejunal loops with there is a gradual area of narrowing and extending to mid/distal ileal loops were there is also a gradual area of narrowing. This could be due to adhesions. 2. Fecalization seen within mid/distal ileal loops. Electronically Signed   By: Prudencio Pair M.D.   On: 03/25/2019 12:24   Dg Knee Complete 4 Views Right  Result Date: 03/25/2019 CLINICAL DATA:  Bilateral knee pain without injury. EXAM: RIGHT KNEE - COMPLETE 4+ VIEW COMPARISON:  None. FINDINGS: No evidence of fracture, dislocation, or joint effusion. Severe narrowing of medial joint space is noted. Moderate narrowing of patellofemoral space is noted with osteophyte formation. Moderate narrowing is noted medially. Soft tissues are unremarkable. 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 No acute abnormality seen in the right knee. Electronically Signed   By: Marijo Conception M.D.   On: 03/25/2019 16:53   Dg Abd Portable 1v-small Bowel Obstruction Protocol-initial, 8 Hr Delay  Result Date: 03/26/2019 CLINICAL DATA:  Small bowel obstruction protocol. Initial 8 hour delay image. EXAM: PORTABLE ABDOMEN - 1 VIEW COMPARISON:  CT abdomen 03/25/2019 and abdominal radiograph 03/26/2019 FINDINGS: Nasogastric tube is coiled in the left upper abdomen. Oral contrast has passed through the small bowel and predominantly in the colon, including the rectum. A small amount of residual contrast in the small bowel. There continues to be a mildly dilated loop of small bowel in the mid abdomen. IMPRESSION: Oral contrast has already moved through the small  bowel and contrast is predominantly in the colon. No significant small bowel distention. Nasogastric tube is coiled in the proximal stomach region. Electronically Signed   By: Markus Daft M.D.   On: 03/26/2019 19:44   Dg Abd Portable 2v  Result Date: 03/26/2019 CLINICAL DATA:  Small bowel obstruction EXAM: PORTABLE  ABDOMEN - 2 VIEW COMPARISON:  CT earlier today FINDINGS: Nasogastric tube with tip at the fundus. Gas dilated loops of bowel in the upper and lower abdomen stable from prior scout image. No concerning mass effect or gas collection IMPRESSION: 1. Small bowel obstruction by CT with unchanged radiographic appearance. 2. Nasogastric tube in place. Electronically Signed   By: Monte Fantasia M.D.   On: 03/26/2019 06:23     CBC Recent Labs  Lab 03/25/19 1116 03/26/19 0329  WBC 13.2* 7.2  HGB 15.4* 13.2  HCT 46.2* 40.6  PLT 317 285  MCV 89.7 90.8  MCH 29.9 29.5  MCHC 33.3 32.5  RDW 13.0 13.2  LYMPHSABS 1.0  --   MONOABS 0.5  --   EOSABS 0.0  --   BASOSABS 0.0  --     Chemistries  Recent Labs  Lab 03/25/19 1116 03/26/19 0329  NA 137 138  K 4.0 3.5  CL 103 104  CO2 21* 24  GLUCOSE 118* 159*  BUN 17 22*  CREATININE 0.61 0.53  CALCIUM 9.4 8.8*  AST 22  --   ALT 18  --   ALKPHOS 64  --   BILITOT 0.8  --    ------------------------------------------------------------------------------------------------------------------ estimated creatinine clearance is 105.6 mL/min (by C-G formula based on SCr of 0.53 mg/dL). ------------------------------------------------------------------------------------------------------------------ No results for input(s): HGBA1C in the last 72 hours. ------------------------------------------------------------------------------------------------------------------ No results for input(s): CHOL, HDL, LDLCALC, TRIG, CHOLHDL, LDLDIRECT in the last 72  hours. ------------------------------------------------------------------------------------------------------------------ No results for input(s): TSH, T4TOTAL, T3FREE, THYROIDAB in the last 72 hours.  Invalid input(s): FREET3 ------------------------------------------------------------------------------------------------------------------ No results for input(s): VITAMINB12, FOLATE, FERRITIN, TIBC, IRON, RETICCTPCT in the last 72 hours.  Coagulation profile No results for input(s): INR, PROTIME in the last 168 hours.  No results for input(s): DDIMER in the last 72 hours.  Cardiac Enzymes No results for input(s): CKMB, TROPONINI, MYOGLOBIN in the last 168 hours.  Invalid input(s): CK ------------------------------------------------------------------------------------------------------------------ Invalid input(s): POCBNP    Assessment & Plan   Small bowel obstruction- seen on CT abdomen/pelvis -Management per surgery -Diet advanced   Hypertension- Blood pressures improved continue amlodipine and lisinopril doses have been increased   Right knee pain -Right knee x-rays show severe osteoarthritis Continue pain control needs outpatient follow-up with orthopedics   Leukocytosis- likely related to SBO. No signs of active infection -WBC count is normal     Code Status Orders  (From admission, onward)         Start     Ordered   03/25/19 1533  Full code  Continuous     03/25/19 1532        Code Status History    This patient has a current code status but no historical code status.   Advance Care Planning Activity           Consults  none  DVT Prophylaxis Lovenox  Lab Results  Component Value Date   PLT 285 03/26/2019     Time Spent in minutes 35-minute greater than 50% of time spent in care coordination and counseling patient regarding the condition and plan of care.   Dustin Flock M.D on 03/27/2019 at 2:41 PM  Between 7am to 6pm - Pager -  [phone]  After 6pm go to [email or web address] - Proofreader  [employer]   Office  [phone]

## 2019-03-27 NOTE — Progress Notes (Signed)
Tolerating a full liquid diet. No falls. Encouraged to ambulate with with NA today.

## 2019-03-27 NOTE — Progress Notes (Signed)
[facility] SURGICAL PROGRESS NOTE (cpt [phone])  Hospital Day(s): 2.   Interval History: Patient seen and examined, no acute events or new complaints overnight. Patient reports she is restless but overall feeling better. No complaints of fever, chills, nausea, emesis. She does not some rumbling and flatus. KUB with contrast shows contrast throughout the colon this morning. No other acute issues or complaints.   Review of Systems:  Constitutional: denies fever, chills  HEENT: denies cough or congestion  Respiratory: denies any shortness of breath  Cardiovascular: denies chest pain or palpitations  Gastrointestinal: denies abdominal pain, N/V, or diarrhea/and bowel function as per interval history Genitourinary: denies burning with urination or urinary frequency   Vital signs in last 24 hours: [min-max] current  Temp:  [98 F (36.7 C)-99.7 F (37.6 C)] 98.8 F (37.1 C) (09/24 0346) Pulse Rate:  [70-87] 70 (09/24 0346) Resp:  [16-18] 16 (09/24 0346) BP: (150-184)/(70-86) 155/82 (09/24 0346) SpO2:  [95 %-97 %] 97 % (09/24 0346)     Height: 5\' 8"  (172.7 cm) Weight: 122.5 kg BMI (Calculated): 41.06   Intake/Output last 2 shifts:  09/23 0701 - 09/24 0700 In: 1344.9 [P.O.:240; I.V.:1104.9] Out: 950 [Urine:550; Emesis/NG output:400]   Physical Exam:  Constitutional: alert, cooperative and no distress  HENT: normocephalic without obvious abnormality, NGT in place  Eyes: PERRL, EOM's grossly intact and symmetric  Respiratory: breathing non-labored at rest  Cardiovascular: regular rate and sinus rhythm  Gastrointestinal: soft, non-tender, and non-distended, no rebound/guarding Musculoskeletal: no edema or wounds, motor and sensation grossly intact, NT    Labs:  CBC Latest Ref Rng & Units 03/26/2019 03/25/2019 08/20/2015  WBC 4.0 - 10.5 K/uL 7.2 13.2(H) 7.6  Hemoglobin 12.0 - 15.0 g/dL 13.2 15.4(H) 13.1  Hematocrit 36.0 - 46.0 % 40.6 46.2(H) 40.1  Platelets 150 - 400  K/uL 285 317 371   CMP Latest Ref Rng & Units 03/26/2019 03/25/2019 08/20/2015  Glucose 70 - 99 mg/dL 159(H) 118(H) 99  BUN 6 - 20 mg/dL 22(H) 17 20  Creatinine 0.44 - 1.00 mg/dL 0.53 0.61 0.62  Sodium 135 - 145 mmol/L 138 137 140  Potassium 3.5 - 5.1 mmol/L 3.5 4.0 4.7  Chloride 98 - 111 mmol/L 104 103 102  CO2 22 - 32 mmol/L 24 21(L) 25  Calcium 8.9 - 10.3 mg/dL 8.8(L) 9.4 9.5  Total Protein 6.5 - 8.1 g/dL - 7.7 6.9  Total Bilirubin 0.3 - 1.2 mg/dL - 0.8 0.2  Alkaline Phos 38 - 126 U/L - 64 75  AST 15 - 41 U/L - 22 22  ALT 0 - 44 U/L - 18 18    Imaging studies:   KUB with contrast 8-hr delay (03/26/2019) personally reviewed showing contrast within the colon, and radiologist report reviewed:  IMPRESSION: 1) Oral contrast has already moved through the small bowel and contrast is predominantly in the colon. No significant small bowel distention. 2) Nasogastric tube is coiled in the proximal stomach region.   Assessment/Plan: (ICD-10's: K2.609) 58 y.o. female with clinically improved SBO with contrast reaching colon on imaging this morning, complicated by pertinent comorbidities including uncontrolled HTN.    - Remove NGT this morning  - Advance to CLD; advance as toelrated   - pain control prn; antiemetics prn             - monitor abdominal examination; on-going bowel function    - Mobilization encouraged if can be done safely.  - Medical management of comorbidities; appreciate medicine assistance with HTN -  DVT prophylaxis   All of the above findings and recommendations were discussed with the patient, and the medical team, and all of patient's questions were answered to her expressed satisfaction.  -- Edison Simon, PA-C [facility] 03/27/2019, 7:11 AM [phone] M-F: 7am - 4pm

## 2019-03-28 LAB — BASIC METABOLIC PANEL
Anion gap: 6 (ref 5–15)
BUN: 9 mg/dL (ref 6–20)
CO2: 28 mmol/L (ref 22–32)
Calcium: 8.6 mg/dL — ABNORMAL LOW (ref 8.9–10.3)
Chloride: 108 mmol/L (ref 98–111)
Creatinine, Ser: 0.46 mg/dL (ref 0.44–1.00)
GFR calc Af Amer: >60 mL/min (ref >60)
GFR calc non Af Amer: >60 mL/min (ref >60)
Glucose, Bld: 101 mg/dL — ABNORMAL HIGH (ref 70–99)
Potassium: 3.3 mmol/L — ABNORMAL LOW (ref 3.5–5.1)
Sodium: 142 mmol/L (ref 135–145)

## 2019-03-28 MED ORDER — LISINOPRIL 20 MG PO TABS: 40.0000 mg | ORAL_TABLET | Freq: Every day | ORAL | Status: DC

## 2019-03-28 MED ORDER — LOSARTAN POTASSIUM 100 MG PO TABS: 100.0000 mg | ORAL_TABLET | Freq: Every day | ORAL | 1 refills | Status: DC

## 2019-03-28 MED ORDER — POTASSIUM CHLORIDE CRYS ER 20 MEQ PO TBCR
20.0000 meq | EXTENDED_RELEASE_TABLET | Freq: Two times a day (BID) | ORAL | Status: DC
  Administered 2019-03-28: 20 meq via ORAL
  Filled 2019-03-28: qty 1

## 2019-03-28 MED ORDER — AMLODIPINE BESYLATE 10 MG PO TABS: 10.0000 mg | ORAL_TABLET | Freq: Every day | ORAL | 1 refills | Status: DC

## 2019-03-28 MED ORDER — POTASSIUM CHLORIDE CRYS ER 20 MEQ PO TBCR
40.0000 meq | EXTENDED_RELEASE_TABLET | Freq: Once | ORAL | Status: AC
  Administered 2019-03-28: 40 meq via ORAL
  Filled 2019-03-28: qty 2

## 2019-03-28 NOTE — Progress Notes (Signed)
Patient went home with her family. Iv was removed, it was dry and intact.dischage paper was explained. Verbally she  Understood.

## 2019-03-28 NOTE — Evaluation (Signed)
Physical Therapy Evaluation Patient Details Name: Rita Foster MRN: [id number] DOB: January 25, 1961 Today's Date: 03/28/2019   History of Present Illness  pt is a 58 yo female who presented to ED with complaints of abdominal pain, nausea and emesis consistent with SBO, pt has pmh including hernia repair and subsequent SBO, PMH includes chronic knee pain and obesity  Clinical Impression  Pt 58 yo female admitted for above. Pt in bed upon arrival and eager to participate with PT. Pt presents with primary complaints of R knee pain and decreased ROM. Pts B UE/LE strength grossly 4/5. Pt performed all functional mobility tasks independently and safely. Pt is limited because of her R knee pain and decreased activity tolerance secondary to the pain and obesity from sedentary lifestyle. Pt educated on weight loss to help reduce knee pain with pt verbalizing trying to lose weight. Pt reports having an exercise bike at home and encouraged to use more frequently to assist with improving knee ROM, decrease pain, and increasing exercise to aid in weight loss. Pt would benefit from outpatient PT to address R knee pain. Pt educated on outpatient PT and verbalized agreement to recommendation stating she wanted to avoid total knee replacement. Pt has no concerns for returning home and has not had any falls in the last year. Pt educated on increasing activity throughout the day despite working from home and encouraged to continue increasing activity levels. Pt encouraged to continue ambulating with SPC until knee pain reduces in order to prevent falls and improve safety. Pt performed seated and standing therex requiring min cuing for correct performance. Pt educated on performing exercises as HEP. Pt is safe to return home following hospital discharge and does not need any further acute therapy services.     Follow Up Recommendations Outpatient PT    Equipment Recommendations  None recommended by PT     Recommendations for Other Services       Precautions / Restrictions Precautions Precautions: Fall Restrictions Weight Bearing Restrictions: No      Mobility  Bed Mobility Overal bed mobility: Independent                Transfers Overall transfer level: Independent               General transfer comment: pt safe with STS from multiple surfaces, pt relies heavy on pushing with UE to rise mostly secondary to pain in R knee  Ambulation/Gait Ambulation/Gait assistance: Modified independent (Device/Increase time) Gait Distance (Feet): 100 Feet Assistive device: 1 person hand held assist Gait Pattern/deviations: Antalgic;Decreased stride length;Decreased weight shift to right;Decreased stance time - right Gait velocity: decreased   General Gait Details: pt ambulated with HHA since pt reports using cane at home, pt ambulates with antalgic gait pattern with decreased gait speed secondary to pain, pt has limited ambulation tolerance secondary to pain and decreased activity tolerance associated with sedentary lifestyle, no unsteadiness or LOB noted  Stairs            Wheelchair Mobility    Modified Rankin (Stroke Patients Only)       Balance Overall balance assessment: No apparent balance deficits (not formally assessed)                                           Pertinent Vitals/Pain Pain Assessment: 0-10 Pain Score: 6  Pain Location: R knee Pain Descriptors /  Indicators: Sharp;Aching;Constant Pain Intervention(s): Limited activity within patient's tolerance;Monitored during session;Repositioned    Home Living Family/patient expects to be discharged to:: Private residence Living Arrangements: Alone Available Help at Discharge: Friend(s);Neighbor;Available PRN/intermittently Type of Home: House Home Access: Stairs to enter Entrance Stairs-Rails: (pt reports using sturdy dog lot on left side and door jam as handrails)   Home Layout:  One level Home Equipment: Cane - single point      Prior Function Level of Independence: Independent with assistive device(s)         Comments: pt reports using her cane for longer distance ambulation however not going many places right now due to covid, pt often furniture surfs in her home, pt reports using sink and side of bathtub to assist with rise from toilet     Hand Dominance        Extremity/Trunk Assessment   Upper Extremity Assessment Upper Extremity Assessment: Overall WFL for tasks assessed    Lower Extremity Assessment Lower Extremity Assessment: Overall WFL for tasks assessed;RLE deficits/detail RLE Deficits / Details: R LE strength of quads and hams unable to assess due to pain, R hip flexors, DF WFL       Communication   Communication: No difficulties  Cognition   Behavior During Therapy: WFL for tasks assessed/performed Overall Cognitive Status: Within Functional Limits for tasks assessed                                        General Comments      Exercises Total Joint Exercises Hip ABduction/ADduction: AROM;Strengthening;Both;10 reps;Standing Long Arc Quad: AROM;Both;10 reps Standing Hip Extension: AROM;Both;10 reps General Exercises - Lower Extremity Toe Raises: AROM;Both;10 reps;Standing Heel Raises: AROM;Both;10 reps;Standing   Assessment/Plan    PT Assessment All further PT needs can be met in the next venue of care  PT Problem List Decreased mobility;Decreased strength;Decreased range of motion;Decreased activity tolerance;Decreased balance;Pain;Obesity       PT Treatment Interventions      PT Goals (Current goals can be found in the Care Plan section)  Acute Rehab PT Goals Patient Stated Goal: return home and decrease pain PT Goal Formulation: With patient Time For Goal Achievement: 04/11/19 Potential to Achieve Goals: Fair    Frequency     Barriers to discharge        Co-evaluation                AM-PAC PT "6 Clicks" Mobility  Outcome Measure Help needed turning from your back to your side while in a flat bed without using bedrails?: None Help needed moving from lying on your back to sitting on the side of a flat bed without using bedrails?: None Help needed moving to and from a bed to a chair (including a wheelchair)?: None Help needed standing up from a chair using your arms (e.g., wheelchair or bedside chair)?: None Help needed to walk in hospital room?: None Help needed climbing 3-5 steps with a railing? : None 6 Click Score: 24    End of Session Equipment Utilized During Treatment: Gait belt Activity Tolerance: Patient limited by pain Patient left: in bed;with call bell/phone within reach Nurse Communication: Mobility status PT Visit Diagnosis: Difficulty in walking, not elsewhere classified (R26.2);Pain Pain - Right/Left: Right Pain - part of body: Knee    Time: 5409-8119 PT Time Calculation (min) (ACUTE ONLY): 37 min   Charges:   PT Evaluation $  PT Eval Low Complexity: 1 Low PT Treatments $Therapeutic Exercise: 8-22 mins       Charley Miske PT, DPT 2:31 PM,03/28/19 [phone]

## 2019-03-28 NOTE — Progress Notes (Signed)
[place] [facility]                                                                                                                                                                                  Patient Demographics   Rita Foster, is a 58 y.o. female, DOB - [birth year], [id number]  Admit date - 03/25/2019   Admitting Physician Jules Husbands, MD  Outpatient Primary MD for the patient is Mar Daring, PA-C   LOS - 3  Subjective: Patient states abdominal pain is improved has had bowel movements.    Review of Systems:   CONSTITUTIONAL: No documented fever. No fatigue, weakness. No weight gain, no weight loss.  EYES: No blurry or double vision.  ENT: No tinnitus. No postnasal drip. No redness of the oropharynx.  RESPIRATORY: No cough, no wheeze, no hemoptysis. No dyspnea.  CARDIOVASCULAR: No chest pain. No orthopnea. No palpitations. No syncope.  GASTROINTESTINAL: No nausea, no vomiting or diarrhea. No abdominal pain. No melena or hematochezia.  GENITOURINARY: No dysuria or hematuria.  ENDOCRINE: No polyuria or nocturia. No heat or cold intolerance.  HEMATOLOGY: No anemia. No bruising. No bleeding.  INTEGUMENTARY: No rashes. No lesions.  MUSCULOSKELETAL: No arthritis. No swelling. No gout.  NEUROLOGIC: No numbness, tingling, or ataxia. No seizure-type activity.  PSYCHIATRIC: No anxiety. No insomnia. No ADD.    Vitals:   Vitals:   03/28/19 0524 03/28/19 0800 03/28/19 1100 03/28/19 1200  BP: (!) 164/61 (!) 195/85 (!) 173/85 (!) 160/79  Pulse: (!) 57 65 62   Resp: 18 20    Temp: 98.3 F (36.8 C)  98 F (36.7 C)   TempSrc: Oral  Oral   SpO2: 97% 96% 97%   Weight:      Height:        Wt Readings from Last 3 Encounters:  03/25/19 122.5 kg  02/06/18 109.8 kg  08/22/16 96.5 kg     Intake/Output Summary (Last 24 hours) at 03/28/2019 1425 Last data filed at 03/28/2019 1409 Gross per 24 hour  Intake 960 ml  Output 1800 ml   Net -840 ml    Physical Exam:   GENERAL: Pleasant-appearing in no apparent distress.  HEAD, EYES, EARS, NOSE AND THROAT: Atraumatic, normocephalic. Extraocular muscles are intact. Pupils equal and reactive to light. Sclerae anicteric. No conjunctival injection. No oro-pharyngeal erythema.  NECK: Supple. There is no jugular venous distention. No bruits, no lymphadenopathy, no thyromegaly.  HEART: Regular rate and rhythm,. No murmurs, no rubs, no clicks.  LUNGS: Clear to auscultation bilaterally. No rales or rhonchi. No wheezes.  ABDOMEN: Soft, flat, nontender, nondistended. Has good bowel sounds. No hepatosplenomegaly appreciated.  EXTREMITIES: No evidence of any cyanosis, clubbing, or peripheral edema.  +2 pedal and radial pulses bilaterally.  NEUROLOGIC: The patient is alert, awake, and oriented x3 with no focal motor or sensory deficits appreciated bilaterally.  SKIN: Moist and warm with no rashes appreciated.  Psych: Not anxious, depressed LN: No inguinal LN enlargement    Antibiotics   Anti-infectives (From admission, onward)   Start     Dose/Rate Route Frequency Ordered Stop   03/27/19 1000  acyclovir (ZOVIRAX) 200 MG capsule 800 mg     800 mg Oral Daily 03/26/19 1434     03/26/19 1445  acyclovir (ZOVIRAX) 200 MG capsule 800 mg  Status:  Discontinued     800 mg Oral Daily 03/26/19 1431 03/26/19 1434      Medications   Scheduled Meds: . acyclovir  800 mg Oral Daily  . amLODipine  10 mg Oral Daily  . enoxaparin (LOVENOX) injection  40 mg Subcutaneous Q12H  . ketorolac  30 mg Intravenous Q6H  . [START ON 03/29/2019] lisinopril  40 mg Oral Daily  . potassium chloride  20 mEq Oral BID   Continuous Infusions:  PRN Meds:.acetaminophen **OR** acetaminophen, hydrALAZINE, ketorolac **FOLLOWED BY** [START ON 03/30/2019] ketorolac, labetalol, morphine injection, ondansetron **OR** ondansetron (ZOFRAN) IV, phenol   Data Review:   Micro Results Recent Results (from the past 240  hour(s))  Novel Coronavirus, NAA (Hosp order, Send-out to Ref Lab; TAT 18-24 hrs     Status: None   Collection Time: 03/25/19 12:53 PM   Specimen: Nasopharyngeal Swab; Respiratory  Result Value Ref Range Status   SARS-CoV-2, NAA NOT DETECTED NOT DETECTED Final    Comment: (NOTE) This nucleic acid amplification test was developed and its performance characteristics determined by [employer]. Nucleic acid amplification tests include PCR and TMA. This test has not been FDA cleared or approved. This test has been authorized by FDA under an Emergency Use Authorization (EUA). This test is only authorized for the duration of time the declaration that circumstances exist justifying the authorization of the emergency use of in vitro diagnostic tests for detection of SARS-CoV-2 virus and/or diagnosis of COVID-19 infection under section 564(b)(1) of the Act, 21 U.S.C. [id number]) (1), unless the authorization is terminated or revoked sooner. When diagnostic testing is negative, the possibility of a false negative result should be considered in the context of a patient's recent exposures and the presence of clinical signs and symptoms consistent with COVID-19. An individual without symptoms of COVID- 19 and who is not shedding SARS-CoV-2 vi rus would expect to have a negative (not detected) result in this assay. Performed At: [facility] [address] [place], [place] [id number] Rush Farmer MD [id number]    [place]  Final    Comment: Performed at [facility], [address] [place], [place] [address]    Radiology Reports Dg Abd 1 View  Result Date: 03/25/2019 CLINICAL DATA:  Ng tube placement EXAM: ABDOMEN - 1 VIEW COMPARISON:  CT from earlier same day FINDINGS: Gastric tube loops in the gastric fundus. The stomach is nondistended. A few gas distended small bowel loops in the mid abdomen. Residual contrast material in the renal  collecting systems. IMPRESSION: Gastric tube in the  fundus. Electronically Signed   By: Lucrezia Europe M.D.   On: 03/25/2019 19:03   Ct Abdomen Pelvis W Contrast  Result Date: 03/25/2019 CLINICAL DATA:  Abdominal pain EXAM: CT ABDOMEN AND PELVIS WITH CONTRAST TECHNIQUE: Multidetector CT imaging of the abdomen  and pelvis was performed using the standard protocol following bolus administration of intravenous contrast. CONTRAST:  143mL OMNIPAQUE IOHEXOL 350 MG/ML SOLN COMPARISON:  None. FINDINGS: Lower chest: The visualized heart size within normal limits. No pericardial fluid/thickening. No hiatal hernia. The visualized portions of the lungs are clear. Hepatobiliary: The liver is normal in density without focal abnormality.The main portal vein is patent. No evidence of calcified gallstones, gallbladder wall thickening or biliary dilatation. Pancreas: Unremarkable. No pancreatic ductal dilatation or surrounding inflammatory changes. Spleen: Normal in size without focal abnormality. Adrenals/Urinary Tract: Both adrenal glands appear normal. The kidneys and collecting system appear normal without evidence of urinary tract calculus or hydronephrosis. Bladder is unremarkable. Stomach/Bowel: The stomach is normal in appearance. The duodenum and proximal jejunal loops air and fluid-filled and nondilated to the level mid jejunal loops. There is a gradual area of narrowing in the distal jejunal loops best seen on series 2, image 56. The distal jejunal loops are fluid and air-filled with dilatation. This extends to the level the mid/distal ileal loops where there is fecalization and dilatation up to 3.7 cm. There is a focal area of narrowing seen within the right lower quadrant best seen on series 2, image 66 within the distal ileal loops. The distal ileal loops/terminal ileum appear to be decompressed. There is air and stool seen within the colon.The appendix is normal. Vascular/Lymphatic: There are no enlarged mesenteric,  retroperitoneal, or pelvic lymph nodes. No significant vascular findings are present. Reproductive: The uterus and adnexa are unremarkable. Other: No evidence of abdominal wall mass or hernia. Musculoskeletal: No acute or significant osseous findings. IMPRESSION: 1. Small-bowel obstruction originating at the mid jejunal loops with there is a gradual area of narrowing and extending to mid/distal ileal loops were there is also a gradual area of narrowing. This could be due to adhesions. 2. Fecalization seen within mid/distal ileal loops. Electronically Signed   By: Prudencio Pair M.D.   On: 03/25/2019 12:24   Dg Knee Complete 4 Views Right  Result Date: 03/25/2019 CLINICAL DATA:  Bilateral knee pain without injury. EXAM: RIGHT KNEE - COMPLETE 4+ VIEW COMPARISON:  None. FINDINGS: No evidence of fracture, dislocation, or joint effusion. Severe narrowing of medial joint space is noted. Moderate narrowing of patellofemoral space is noted with osteophyte formation. Moderate narrowing is noted medially. Soft tissues are unremarkable. 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 No acute abnormality seen in the right knee. Electronically Signed   By: Marijo Conception M.D.   On: 03/25/2019 16:53   Dg Abd Portable 1v-small Bowel Obstruction Protocol-initial, 8 Hr Delay  Result Date: 03/26/2019 CLINICAL DATA:  Small bowel obstruction protocol. Initial 8 hour delay image. EXAM: PORTABLE ABDOMEN - 1 VIEW COMPARISON:  CT abdomen 03/25/2019 and abdominal radiograph 03/26/2019 FINDINGS: Nasogastric tube is coiled in the left upper abdomen. Oral contrast has passed through the small bowel and predominantly in the colon, including the rectum. A small amount of residual contrast in the small bowel. There continues to be a mildly dilated loop of small bowel in the mid abdomen. IMPRESSION: Oral contrast has already moved through the small bowel and contrast is predominantly in the colon. No  significant small bowel distention. Nasogastric tube is coiled in the proximal stomach region. Electronically Signed   By: Markus Daft M.D.   On: 03/26/2019 19:44   Dg Abd Portable 2v  Result Date: 03/26/2019 CLINICAL DATA:  Small bowel obstruction EXAM: PORTABLE ABDOMEN - 2 VIEW COMPARISON:  CT  earlier today FINDINGS: Nasogastric tube with tip at the fundus. Gas dilated loops of bowel in the upper and lower abdomen stable from prior scout image. No concerning mass effect or gas collection IMPRESSION: 1. Small bowel obstruction by CT with unchanged radiographic appearance. 2. Nasogastric tube in place. Electronically Signed   By: Monte Fantasia M.D.   On: 03/26/2019 06:23     CBC Recent Labs  Lab 03/25/19 1116 03/26/19 0329  WBC 13.2* 7.2  HGB 15.4* 13.2  HCT 46.2* 40.6  PLT 317 285  MCV 89.7 90.8  MCH 29.9 29.5  MCHC 33.3 32.5  RDW 13.0 13.2  LYMPHSABS 1.0  --   MONOABS 0.5  --   EOSABS 0.0  --   BASOSABS 0.0  --     Chemistries  Recent Labs  Lab 03/25/19 1116 03/26/19 0329 03/28/19 0450  NA 137 138 142  K 4.0 3.5 3.3*  CL 103 104 108  CO2 21* 24 28  GLUCOSE 118* 159* 101*  BUN 17 22* 9  CREATININE 0.61 0.53 0.46  CALCIUM 9.4 8.8* 8.6*  AST 22  --   --   ALT 18  --   --   ALKPHOS 64  --   --   BILITOT 0.8  --   --    ------------------------------------------------------------------------------------------------------------------ estimated creatinine clearance is 105.6 mL/min (by C-G formula based on SCr of 0.46 mg/dL). ------------------------------------------------------------------------------------------------------------------ No results for input(s): HGBA1C in the last 72 hours. ------------------------------------------------------------------------------------------------------------------ No results for input(s): CHOL, HDL, LDLCALC, TRIG, CHOLHDL, LDLDIRECT in the last 72  hours. ------------------------------------------------------------------------------------------------------------------ No results for input(s): TSH, T4TOTAL, T3FREE, THYROIDAB in the last 72 hours.  Invalid input(s): FREET3 ------------------------------------------------------------------------------------------------------------------ No results for input(s): VITAMINB12, FOLATE, FERRITIN, TIBC, IRON, RETICCTPCT in the last 72 hours.  Coagulation profile No results for input(s): INR, PROTIME in the last 168 hours.  No results for input(s): DDIMER in the last 72 hours.  Cardiac Enzymes No results for input(s): CKMB, TROPONINI, MYOGLOBIN in the last 168 hours.  Invalid input(s): CK ------------------------------------------------------------------------------------------------------------------ Invalid input(s): POCBNP    Assessment & Plan   Small bowel obstruction- seen on CT abdomen/pelvis -Management per surgery Now resolved   Hypertension- Blood pressures improved I will discharge patient on Cozaar and Norvasc.  She will outpatient follow-up with primary care provider and asked her to buy a blood pressure cuff. Also advised her on a low-sodium diet.   Hypokalemia patient's potassium is replaced.    Right knee pain -Right knee x-rays show severe osteoarthritis Continue pain control needs outpatient follow-up with orthopedics she will need knee replacement   Leukocytosis- likely related to SBO. No signs of active infection -WBC count is normal     Code Status Orders  (From admission, onward)         Start     Ordered   03/25/19 1533  Full code  Continuous     03/25/19 1532        Code Status History    This patient has a current code status but no historical code status.   Advance Care Planning Activity           Consults  none  DVT Prophylaxis Lovenox  Lab Results  Component Value Date   PLT 285 03/26/2019     Time Spent in  minutes 35-minute greater than 50% of time spent in care coordination and counseling patient regarding the condition and plan of care.   Dustin Flock M.D on 03/28/2019 at 2:25 PM  Between 7am  to 6pm - Pager - [phone]  After 6pm go to [email or web address] - Proofreader  [employer]   Office  [phone]

## 2019-03-28 NOTE — Discharge Summary (Signed)
[facility] SURGICAL DISCHARGE SUMMARY (cpt: [phone])  [name] MRN: [id number] DOB/AGE: 11/04/1960 58 y.o.  Admit date: 03/25/2019 Discharge date: 03/28/2019  Discharge Diagnoses Patient Active Problem List   Diagnosis Date Noted  . SBO (small bowel obstruction) ([place]) 03/25/2019    Consultants Medicine  Procedures None  HPI: 58 y.o. female presented to [facility] ED today for abdominal pain. Patient reports the acute onset of abdominal pain, nausea, and emesis overnight. She reports feeling "discomfort" yesterday but this worsened overnight. She has had diffuse abdominal pain which is sharp and crampy. Multiple episodes of non-bloody, non-bilious emesis. Her symptoms are improved in the ED with analgesics and antiemetics. No other associated symptoms. She does have a history of hernia repair in 2011 and subsequent SBO a few months later. This presentation is similar to her previous SBO presentations. No additional abdominal surgeries. Of note, she has also been complaining of increasing right knee pain. 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 This has been getting worse secondary to gaining weight during quarantine. She has seen Dr Marry Guan for this in the past. Work up in the ED was concerning for small bowel obstruction.   Hospital Course: She was admitted to general surgery with medicine consult for HTN management. Her knee pain was also discussed with orthopedics (Dr Marry Guan) who will follow as outpatient. Conservative management was attempted however she became nauseous and had a few episodes of emesis. Due to this, NGT was placed on day of admission. She underwent follow up KUB with contrast the followig day which showed contrast reaching the colon and she had return of bowel function on HD2. Advancement of patient's diet and ambulation were well-tolerated. The remainder of patient's hospital course was essentially unremarkable, and discharge  planning was initiated accordingly with patient safely able to be discharged home with appropriate discharge instructions, pain control, and outpatient follow-up after all of her questions were answered to her expressed satisfaction.  Discussed with medicine. They started her on antihypertensives. I will arrange close follow up with patient's PCP.   Discharge Condition: Good   Physical Examination:  Constitutional: alert, cooperative and no distress  HENT: normocephalic without obvious abnormality, NGT in place  Eyes: PERRL, EOM's grossly intact and symmetric  Respiratory: breathing non-labored at rest  Cardiovascular: regular rate and sinus rhythm  Gastrointestinal: soft, non-tender, and non-distended, no rebound/guarding Musculoskeletal: no edema or wounds, motor and sensation grossly intact, NT    Allergies as of 03/28/2019      Reactions   Penicillins       Medication List    TAKE these medications   acetaminophen 500 MG tablet Commonly known as: TYLENOL Take 1,000 mg by mouth at bedtime as needed for mild pain or moderate pain.   acyclovir 400 MG tablet Commonly known as: ZOVIRAX TAKE 1 TABLET BY MOUTH TWICE DAILY What changed:   how much to take  when to take this   amLODipine 10 MG tablet Commonly known as: NORVASC Take 1 tablet (10 mg total) by mouth daily. Start taking on: March 29, 2019   aspirin EC 81 MG tablet Take 81 mg by mouth every evening.   Fish Oil 1000 MG Caps Take 2,000 mg by mouth daily at 12 noon.   losartan 100 MG tablet Commonly known as: Cozaar Take 1 tablet (100 mg total) by mouth daily.   Multiple Vitamin tablet Take 1 tablet by mouth daily.   naproxen sodium 220 MG tablet Commonly known as:  ALEVE Take 440 mg by mouth daily as needed (pain).   Osteo Bi-Flex Joint Shield Tabs Take 2 tablets by mouth daily at 12 noon.   Turmeric 500 MG Caps Take 1,000 mg by mouth daily at 12 noon.        Follow-up Information     Hooten, Laurice Record, MD Follow up in 2 week(s).   Specialty: Orthopedic Surgery Why: right knee pain, degenerative changes, hx of patellafemoral syndrome Contact information: [place] [address] [phone]        Mar Daring, [place]. Schedule an appointment as soon as possible for a visit in 1 week(s).   Specialty: Family Medicine Why: HTN management, started on antihypetensives in hospital (recent hospitalization for SBO) Contact information: [place] [address] [phone]            Time spent on discharge management including discussion of hospital course, clinical condition, outpatient instructions, prescriptions, and follow up with the patient and members of the medical team: >30 minutes  -- Edison Simon , PA-C [facility]  03/28/2019, 11:15 AM [phone] M-F: 7am - 4pm

## 2019-03-31 ENCOUNTER — Telehealth: Payer: Self-pay | Admitting: Surgery

## 2019-03-31 ENCOUNTER — Telehealth: Payer: Self-pay

## 2019-03-31 NOTE — Telephone Encounter (Signed)
Return to work note sent to [employer]. She will call if she needs anything further.

## 2019-03-31 NOTE — Telephone Encounter (Signed)
Patient is calling said she saw Dr. Dahlia Byes in the ER and is needing a return to work letter, she returned to work today. Patient is signing up for mychart and asked to give her about 30 mins to sign up. Please call patient if any questions.

## 2019-03-31 NOTE — Telephone Encounter (Signed)
Transition Care Management Follow-up Telephone Call  Date of discharge and from where: [facility] on 03/28/19  How have you been since you were released from the hospital? Feeling very stressed. Pt states she was told her b/p was high in the hospital. Pt is dealing with her mother and cat that are sick. Pt has started her new b/p meds. Pt states that she had a normal BM last night. Declined pain, fever or n/v/d. Pt is working to change her diet to eat healthier. Pt was eating bad since Covid started. Now pt is on weight watchers.   Any questions or concerns? Yes, pt is stressed and is concerned about b/p elevation. Will follow up with PCP at [place] apt scheduled 04/04/19.   Items Reviewed:  Did the pt receive and understand the discharge instructions provided? Yes   Medications obtained and verified? Yes   Any new allergies since your discharge? No   Dietary orders reviewed? Yes  Do you have support at home? Not in the home but does have friends to call if needed.   Other (ie: DME, Home Health, etc) N/A  Functional Questionnaire: (I = Independent and D = Dependent)  Bathing/Dressing- I   Meal Prep- I  Eating- I  Maintaining continence- I  Transferring/Ambulation- D, uses a cane for assistance.   Managing Meds- I   Follow up appointments reviewed:    PCP Hospital f/u appt confirmed? Yes  Scheduled to see Fenton Malling on 04/04/19 @ 1:20 PM.  [facility] f/u appt confirmed? Yes    Are transportation arrangements needed? No   If their condition worsens, is the pt aware to call  their PCP or go to the ED? Yes  Was the patient provided with contact information for the PCP's office or ED? Yes  Was the pt encouraged to call back with questions or concerns? Yes

## 2019-03-31 NOTE — Telephone Encounter (Signed)
[facility]U scheduled for 04/04/19 @ 1:20 PM.

## 2019-03-31 NOTE — Telephone Encounter (Signed)
I have made the 1st attempt to contact the patient or family member in charge, in order to follow up from recently being discharged from the hospital. I left a message on voicemail requesting a CB. -MM 

## 2019-04-03 NOTE — Progress Notes (Signed)
Patient: Rita Foster Female    DOB: Nov 03, 1960   58 y.o.   MRN: [id number] Visit Date: 04/04/2019  Today's Provider: Mar Daring, PA-C   Chief Complaint  Patient presents with   Hospitalization Follow-up   Subjective:    I,Rita Foster,RMA am acting as a Education administrator for Mar Daring, PA-C.  HPI    Follow up ER visit  Patient was admitted to the hospital for abdominal pain/SBO on 03/25/2019 and discharged on 03/28/19.  She was treated for small bowel obstruction. Telephone follow up was done on 03/31/19.  Treatment for this included nasogastric suction and NPO until pain resolves then slowly increase food as tolerated.  She reports good compliance with treatment. She reports this condition is Improved. This was her second bowel obstruction, last one was over 5 years ago per patient.   Also noted to by hypertensive in the hospital and was started on Losartan 100mg  and amlodipine 10mg . She is tolerating both well.  ------------------------------------------------------------------------------------    Allergies  Allergen Reactions   Penicillins      Current Outpatient Medications:    acetaminophen (TYLENOL) 500 MG tablet, Take 1,000 mg by mouth at bedtime as needed for mild pain or moderate pain., Disp: , Rfl:    acyclovir (ZOVIRAX) 400 MG tablet, TAKE 1 TABLET BY MOUTH TWICE DAILY (Patient taking differently: Take 800 mg by mouth daily. ), Disp: 180 tablet, Rfl: 1   amLODipine (NORVASC) 10 MG tablet, Take 1 tablet (10 mg total) by mouth daily., Disp: 30 tablet, Rfl: 1   aspirin EC 81 MG tablet, Take 81 mg by mouth every evening., Disp: , Rfl:    lidocaine (LMX) 4 % cream, Apply 1 application topically as needed., Disp: , Rfl:    losartan (COZAAR) 100 MG tablet, Take 1 tablet (100 mg total) by mouth daily., Disp: 30 tablet, Rfl: 1   Menthol-Methyl Salicylate (MUSCLE RUB EX), Apply topically., Disp: , Rfl:    Misc Natural Products (OSTEO  BI-FLEX JOINT SHIELD) TABS, Take 2 tablets by mouth daily at 12 noon. , Disp: , Rfl:    Multiple Vitamin tablet, Take 1 tablet by mouth daily. , Disp: , Rfl:    naproxen sodium (ALEVE) 220 MG tablet, Take 440 mg by mouth daily as needed (pain)., Disp: , Rfl:    NON FORMULARY, rawleigh medicated ointment, Disp: , Rfl:    Omega-3 Fatty Acids (FISH OIL) 1000 MG CAPS, Take 2,000 mg by mouth daily at 12 noon., Disp: , Rfl:    Turmeric 500 MG CAPS, Take 1,000 mg by mouth daily at 12 noon., Disp: , Rfl:   Review of Systems  Constitutional: Negative for appetite change, chills, fatigue and fever.  Respiratory: Negative for chest tightness and shortness of breath.   Cardiovascular: Negative for chest pain and palpitations.  Gastrointestinal: Negative for abdominal distention, abdominal pain, constipation, diarrhea, nausea and vomiting.  Neurological: Negative for dizziness, weakness, numbness and headaches.    Social History   Tobacco Use   Smoking status: Former Smoker    Years: 5.00    Quit date: 07/03/1999    Years since quitting: 19.7   Smokeless tobacco: Never Used  Substance Use Topics   Alcohol use: No      Objective:   BP 140/86 (BP Location: Left Arm, Patient Position: Sitting, Cuff Size: Large)    Pulse 84    Temp (!) 97.2 F (36.2 C) (Other (Comment))    Resp 16  Wt 264 lb (119.7 kg)    BMI 40.14 kg/m  Vitals:   04/04/19 1329  BP: 140/86  Pulse: 84  Resp: 16  Temp: (!) 97.2 F (36.2 C)  TempSrc: Other (Comment)  Weight: 264 lb (119.7 kg)  Body mass index is 40.14 kg/m.   Physical Exam Vitals signs reviewed.  Constitutional:      General: She is not in acute distress.    Appearance: Normal appearance. She is well-developed. She is obese. She is not ill-appearing or diaphoretic.  Neck:     Musculoskeletal: Normal range of motion and neck supple.     Thyroid: No thyromegaly.     Vascular: No JVD.     Trachea: No tracheal deviation.  Cardiovascular:      Rate and Rhythm: Normal rate and regular rhythm.     Pulses: Normal pulses.     Heart sounds: Normal heart sounds. No murmur. No friction rub. No gallop.   Pulmonary:     Effort: Pulmonary effort is normal. No respiratory distress.     Breath sounds: Normal breath sounds. No wheezing or rales.  Abdominal:     General: Abdomen is flat. Bowel sounds are normal. There is no distension.     Palpations: Abdomen is soft. There is no mass.     Tenderness: There is no abdominal tenderness.  Lymphadenopathy:     Cervical: No cervical adenopathy.  Neurological:     Mental Status: She is alert.      No results found for any visits on 04/04/19.     Assessment & Plan    1. Encounter for support and coordination of transition of care Notes, results, images, and consults from hospitalization from 9/22-9/25/20 were reviewed prior to appointment today by me. Transition of care call was completed on 03/31/19 and reviewed.   2. SBO (small bowel obstruction) ([facility]) Improved. Still slowly increasing diet as tolerated. Reports much improvement. Having normal BM and passing gas without issue.   3. Essential hypertension BP improved compared to hospitalization but still borderline high. Patient wants to monitor BP at home and continue current regimen and work on lifestyle modifications (DASH diet; she reports previously eating very high salt diet). I will see her back in 4 weeks for BP recheck and consider rechecking labs. She agrees.  - Blood Pressure Monitoring (BLOOD PRESSURE MONITOR/WRIST) DEVI; To check bp daily  Dispense: 1 Device; Refill: 0 - amLODipine (NORVASC) 10 MG tablet; Take 1 tablet (10 mg total) by mouth daily.  Dispense: 90 tablet; Refill: 1 - losartan (COZAAR) 100 MG tablet; Take 1 tablet (100 mg total) by mouth daily.  Dispense: 90 tablet; Refill: Leonidas, PA-C  [facility]

## 2019-04-04 ENCOUNTER — Encounter: Payer: Self-pay | Admitting: Physician Assistant

## 2019-04-04 ENCOUNTER — Telehealth: Payer: Self-pay

## 2019-04-04 ENCOUNTER — Ambulatory Visit: Payer: Managed Care, Other (non HMO) | Admitting: Physician Assistant

## 2019-04-04 ENCOUNTER — Other Ambulatory Visit: Payer: Self-pay

## 2019-04-04 VITALS — BP 140/86 | HR 84 | Temp 97.2°F | Resp 16 | Wt 264.0 lb

## 2019-04-04 DIAGNOSIS — Z7689 Persons encountering health services in other specified circumstances: Secondary | ICD-10-CM

## 2019-04-04 DIAGNOSIS — K56609 Unspecified intestinal obstruction, unspecified as to partial versus complete obstruction: Secondary | ICD-10-CM | POA: Diagnosis not present

## 2019-04-04 DIAGNOSIS — I10 Essential (primary) hypertension: Secondary | ICD-10-CM | POA: Diagnosis not present

## 2019-04-04 MED ORDER — LOSARTAN POTASSIUM 100 MG PO TABS: 100.0000 mg | ORAL_TABLET | Freq: Every day | ORAL | 1 refills | Status: DC

## 2019-04-04 MED ORDER — BLOOD PRESSURE MONITOR/WRIST DEVI: 0 refills | Status: AC

## 2019-04-04 MED ORDER — AMLODIPINE BESYLATE 10 MG PO TABS: 10.0000 mg | ORAL_TABLET | Freq: Every day | ORAL | 1 refills | Status: DC

## 2019-04-04 NOTE — Telephone Encounter (Signed)
FMLA paperwork completed and faxed to [employer] [phone]

## 2019-04-09 ENCOUNTER — Encounter: Payer: Self-pay | Admitting: Physician Assistant

## 2019-04-25 ENCOUNTER — Telehealth: Payer: Self-pay | Admitting: Physician Assistant

## 2019-04-25 NOTE — Telephone Encounter (Signed)
Just FYI.

## 2019-04-25 NOTE — Telephone Encounter (Signed)
Pt has a FMLA form that she is going either fax or send through my-chart to be completed by her primary for her days when she had the intestinal blockage and was in the hospital.  She followed up with Tawanna Sat for the hospital stay.   This is just an FYI for a watch for when it comes in  [place]

## 2019-05-01 NOTE — Telephone Encounter (Signed)
Form completed and will be brought and left on my desk today (05/01/19) to be faxed.

## 2019-05-02 NOTE — Telephone Encounter (Signed)
Faxed today 05/02/2019

## 2019-05-21 NOTE — Progress Notes (Signed)
Patient: Rita Foster, Female    DOB: [birth year], 59 y.o.   MRN: [id number] Visit Date: 05/22/2019  Today's Provider: Mar Daring, PA-C   Chief Complaint  Patient presents with  . Annual Exam   Subjective:     Annual physical exam Rita Foster is a 58 y.o. female who presents today for health maintenance and complete physical. She feels fairly well. Pt states her mom recently passed away (passed on 06-01-2019 due Foster declining health).   She reports not exercising secondary Foster knee pain. She reports she is sleeping fairly well. ----------------------------------------------------------------- 07/08/2018 -Normal mammogram. Repeat screening in one year. 02/11/2018 Pap is normal, HPV negative. GC/chlamydia negative. 08/29/2013-Dr. Oh. Entire colon WNL. Repeat 10 years    Review of Systems  Constitutional: Negative.   HENT: Negative.   Eyes: Negative.   Respiratory: Negative.   Cardiovascular: Negative.   Gastrointestinal: Negative.   Endocrine: Negative.   Genitourinary: Negative.   Musculoskeletal: Negative.   Skin: Negative.   Allergic/Immunologic: Negative.   Neurological: Negative.   Hematological: Negative.   Psychiatric/Behavioral: Negative.     Social History      She  reports that she quit smoking about 19 years ago. She quit after 5.00 years of use. She has never used smokeless tobacco. She reports that she does not drink alcohol or use drugs.       Social History   Socioeconomic History  . Marital status: Divorced    Spouse name: Not on file  . Number of children: Not on file  . Years of education: Not on file  . Highest education level: Not on file  Occupational History  . Not on file  Social Needs  . Financial resource strain: Not on file  . Food insecurity    Worry: Not on file    Inability: Not on file  . Transportation needs    Medical: Not on file    Non-medical: Not on file  Tobacco Use  . Smoking status: Former Smoker    Years: 5.00    Quit date: 07/03/1999    Years since quitting: 19.8  . Smokeless tobacco: Never Used  Substance and Sexual Activity  . Alcohol use: No  . Drug use: No  . Sexual activity: Not on file  Lifestyle  . Physical activity    Days per week: Not on file    Minutes per session: Not on file  . Stress: Not on file  Relationships  . Social Herbalist on phone: Not on file    Gets together: Not on file    Attends religious service: Not on file    Active member of club or organization: Not on file    Attends meetings of clubs or organizations: Not on file    Relationship status: Not on file  Other Topics Concern  . Not on file  Social History Narrative  . Not on file    No past medical history on file.   Patient Active Problem List   Diagnosis Date Noted  . SBO (small bowel obstruction) ([place]) 03/25/2019  . Absence of menstruation 04/02/2015  . BMI 37.0-37.9, adult 04/02/2015  . Hypercholesteremia 04/02/2015  . Symptomatic menopausal or female climacteric states 04/02/2015  . 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 Herpes 06/19/2008  . Adiposity 09/19/2006  . Asthma 09/14/2006  . Arthralgia of lower leg 09/14/2006    Past Surgical History:  Procedure Laterality  Date  . BREAST BIOPSY Left 10+ years ago   Negative core  . BREAST SURGERY  2002   biopsy  . CATARACT EXTRACTION Right 07/2014  . HERNIA REPAIR  02/21/2010  . TONSILLECTOMY AND ADENOIDECTOMY  1968    Family History        Family Status  Relation Name Status  . Mother  Alive  . Father  Alive  . MGM  Deceased at age 7       natural causes  . MGF  Deceased       artherosclerosis  . PGM  Deceased       old age  . PGF  Deceased       unknown causes  . Ethlyn Daniels  (Not Specified)        Her family history includes Breast cancer in her paternal aunt; Breast cancer (age of onset: 59) in her mother; Crohn's disease in her mother; Hypertension in her father; Rheum  arthritis in her mother; Skin cancer in her father; Thyroid disease in her mother.      Allergies  Allergen Reactions  . Penicillins      Current Outpatient Medications:  .  acetaminophen (TYLENOL) 500 MG tablet, Take 1,000 mg by mouth at bedtime as needed for mild pain or moderate pain., Disp: , Rfl:  .  acyclovir (ZOVIRAX) 400 MG tablet, TAKE 1 TABLET BY MOUTH TWICE DAILY (Patient taking differently: Take 800 mg by mouth daily. ), Disp: 180 tablet, Rfl: 1 .  amLODipine (NORVASC) 10 MG tablet, Take 1 tablet (10 mg total) by mouth daily., Disp: 90 tablet, Rfl: 1 .  aspirin EC 81 MG tablet, Take 81 mg by mouth every evening., Disp: , Rfl:  .  Blood Pressure Monitoring (BLOOD PRESSURE MONITOR/WRIST) DEVI, Foster check bp daily, Disp: 1 Device, Rfl: 0 .  lidocaine (LMX) 4 % cream, Apply 1 application topically as needed., Disp: , Rfl:  .  losartan (COZAAR) 100 MG tablet, Take 1 tablet (100 mg total) by mouth daily., Disp: 90 tablet, Rfl: 1 .  Menthol-Methyl Salicylate (MUSCLE RUB EX), Apply topically., Disp: , Rfl:  .  Misc Natural Products (OSTEO BI-FLEX JOINT SHIELD) TABS, Take 2 tablets by mouth daily at 12 noon. , Disp: , Rfl:  .  Multiple Vitamin tablet, Take 1 tablet by mouth daily. , Disp: , Rfl:  .  naproxen sodium (ALEVE) 220 MG tablet, Take 440 mg by mouth daily as needed (pain)., Disp: , Rfl:  .  NON FORMULARY, rawleigh medicated ointment, Disp: , Rfl:  .  Omega-3 Fatty Acids (FISH OIL) 1000 MG CAPS, Take 2,000 mg by mouth daily at 12 noon., Disp: , Rfl:  .  Turmeric 500 MG CAPS, Take 1,000 mg by mouth daily at 12 noon., Disp: , Rfl:    Patient Care Team: Rubye Beach as PCP - General (Family Medicine)    Objective:    Vitals: BP (!) 138/93 (BP Location: Left Arm, Patient Position: Sitting, Cuff Size: Large)   Pulse 73   Temp (!) 97.3 F (36.3 C) (Temporal)   Ht 5' 6.5" (1.689 m)   Wt 252 lb (114.3 kg)   BMI 40.06 kg/m    Vitals:   05/22/19 0918  BP: (!)  138/93  Pulse: 73  Temp: (!) 97.3 F (36.3 C)  TempSrc: Temporal  Weight: 252 lb (114.3 kg)  Height: 5' 6.5" (1.689 m)     Physical Exam Vitals signs reviewed.  Constitutional:  General: She is not in acute distress.    Appearance: Normal appearance. She is well-developed. She is obese. She is not diaphoretic.  HENT:     Head: Normocephalic and atraumatic.     Right Ear: Hearing, tympanic membrane, ear canal and external ear normal.     Left Ear: Hearing, tympanic membrane, ear canal and external ear normal.     Nose: Nose normal.     Mouth/Throat:     Mouth: Mucous membranes are moist.     Pharynx: Uvula midline. No oropharyngeal exudate.  Eyes:     General: No scleral icterus.       Right eye: No discharge.        Left eye: No discharge.     Extraocular Movements: Extraocular movements intact.     Conjunctiva/sclera: Conjunctivae normal.     Pupils: Pupils are equal, round, and reactive Foster light.  Neck:     Musculoskeletal: Normal range of motion and neck supple.     Thyroid: No thyromegaly.     Vascular: No carotid bruit or JVD.     Trachea: No tracheal deviation.  Cardiovascular:     Rate and Rhythm: Normal rate and regular rhythm.     Pulses: Normal pulses.     Heart sounds: Normal heart sounds. No murmur. No friction rub. No gallop.   Pulmonary:     Effort: Pulmonary effort is normal. No respiratory distress.     Breath sounds: Normal breath sounds. No wheezing or rales.  Chest:     Chest wall: No tenderness.     Breasts: Breasts are symmetrical.        Right: No inverted nipple, mass, nipple discharge, skin change or tenderness.        Left: No inverted nipple, mass, nipple discharge, skin change or tenderness.  Abdominal:     General: Abdomen is flat. Bowel sounds are normal. There is no distension.     Palpations: Abdomen is soft. There is no mass.     Tenderness: There is no abdominal tenderness. There is no guarding or rebound.     Hernia: There is no  hernia in the left inguinal area.  Genitourinary:    Exam position: Supine.     Labia:        Right: No rash, tenderness, lesion or injury.        Left: No rash, tenderness, lesion or injury.      Vagina: Normal. No signs of injury. No vaginal discharge, erythema, tenderness or bleeding.     Cervix: No cervical motion tenderness, discharge or friability.     Adnexa:        Right: No mass, tenderness or fullness.         Left: No mass, tenderness or fullness.       Rectum: Normal.  Musculoskeletal: Normal range of motion.        General: No tenderness.     Right lower leg: No edema.     Left lower leg: No edema.  Lymphadenopathy:     Cervical: No cervical adenopathy.  Skin:    General: Skin is warm and dry.     Capillary Refill: Capillary refill takes less than 2 seconds.     Findings: No rash.  Neurological:     General: No focal deficit present.     Mental Status: She is alert and oriented Foster person, place, and time. Mental status is at baseline.     Cranial Nerves: No cranial nerve  deficit.     Coordination: Coordination normal.     Deep Tendon Reflexes: Reflexes are normal and symmetric.  Psychiatric:        Mood and Affect: Mood normal.        Behavior: Behavior normal.        Thought Content: Thought content normal.        Judgment: Judgment normal.      Depression Screen PHQ 2/9 Scores 05/22/2019 02/06/2018 08/20/2015  PHQ - 2 Score 0 0 -  PHQ- 9 Score 1 5 -  Exception Documentation - - Patient refusal       Assessment & Plan:     Routine Health Maintenance and Physical Exam  Exercise Activities and Dietary recommendations Goals   None     Immunization History  Administered Date(s) Administered  . Influenza,inj,Quad PF,6+ Mos 03/27/2019  . Pneumococcal Polysaccharide-23 05/03/2005  . Td 08/04/2002  . Tdap 01/27/2010    Health Maintenance  Topic Date Due  . MAMMOGRAM  07/09/2019  . TETANUS/TDAP  01/28/2020  . PAP SMEAR-Modifier  02/06/2021  .  COLONOSCOPY  08/30/2023  . INFLUENZA VACCINE  Completed  . Hepatitis C Screening  Completed  . HIV Screening  Completed     Discussed health benefits of physical activity, and encouraged her Foster engage in regular exercise appropriate for her age and condition.    1. Annual physical exam Normal physical exam today. Will check labs as below and f/u pending lab results. If labs are stable and WNL she will not need Foster have these rechecked for one year at her next annual physical exam. She is Foster call the office in the meantime if she has any acute issue, questions or concerns. - CBC w/Diff/Platelet - Comprehensive Metabolic Panel (CMET) - TSH - Lipid Profile - HgB A1c  2. Cervical cancer screening Pap collected today. Will send as below and f/u pending results. - Pap IG, CT/NG w/ reflex HPV when ASC-U  3. Encounter for breast cancer screening using non-mammogram modality Breast exam today was normal. There is no family history of breast cancer. She does perform regular self breast exams. Mammogram was ordered as below. Information for [facility] clinic was given Foster patient so she may schedule her mammogram at her convenience. - MM 3D SCREEN BREAST BILATERAL; Future  4. Hypercholesteremia Diet controlled. Will check labs as below and f/u pending results. - CBC w/Diff/Platelet - Comprehensive Metabolic Panel (CMET) - Lipid Profile - HgB A1c  5. Class 3 severe obesity due Foster excess calories with serious comorbidity and body mass index (BMI) of 40.0 Foster 44.9 in adult [facility]) Counseled patient on healthy lifestyle modifications including dieting and exercise.  - CBC w/Diff/Platelet - Comprehensive Metabolic Panel (CMET) - Lipid Profile - HgB A1c  6. Ovarian cancer screening Patient reports h/o talcum use for many years and wants screening. Works for [employer] and screenings are covered for her. Reasonable Foster check.  - CA 125   --------------------------------------------------------------------    Mar Daring, PA-C  [facility]

## 2019-05-22 ENCOUNTER — Ambulatory Visit (INDEPENDENT_AMBULATORY_CARE_PROVIDER_SITE_OTHER): Payer: Managed Care, Other (non HMO) | Admitting: Physician Assistant

## 2019-05-22 ENCOUNTER — Encounter: Payer: Self-pay | Admitting: Physician Assistant

## 2019-05-22 ENCOUNTER — Other Ambulatory Visit: Payer: Self-pay

## 2019-05-22 VITALS — BP 138/93 | HR 73 | Temp 97.3°F | Ht 66.5 in | Wt 252.0 lb

## 2019-05-22 DIAGNOSIS — E7800 Pure hypercholesterolemia, unspecified: Secondary | ICD-10-CM | POA: Diagnosis not present

## 2019-05-22 DIAGNOSIS — Z1239 Encounter for other screening for malignant neoplasm of breast: Secondary | ICD-10-CM | POA: Diagnosis not present

## 2019-05-22 DIAGNOSIS — Z0000 Encounter for general adult medical examination without abnormal findings: Secondary | ICD-10-CM

## 2019-05-22 DIAGNOSIS — Z124 Encounter for screening for malignant neoplasm of cervix: Secondary | ICD-10-CM

## 2019-05-22 DIAGNOSIS — Z6841 Body Mass Index (BMI) 40.0 and over, adult: Secondary | ICD-10-CM

## 2019-05-22 DIAGNOSIS — Z1273 Encounter for screening for malignant neoplasm of ovary: Secondary | ICD-10-CM

## 2019-05-22 NOTE — Patient Instructions (Signed)

## 2019-05-29 LAB — PAP IG, CT-NG, RFX HPV ASCU
Chlamydia, Nuc. Acid Amp: NEGATIVE
Gonococcus by Nucleic Acid Amp: NEGATIVE
PAP Smear Comment: 0

## 2019-06-02 ENCOUNTER — Telehealth: Payer: Self-pay

## 2019-06-02 NOTE — Telephone Encounter (Signed)
-----   Message from Mar Daring, [place] sent at 06/02/2019  8:42 AM EST ----- Pap is normal. Negative for GC/Chlamydia.

## 2019-06-02 NOTE — Telephone Encounter (Signed)
Patient advised as directed below. 

## 2019-06-23 ENCOUNTER — Telehealth: Payer: Self-pay

## 2019-06-23 DIAGNOSIS — Z008 Encounter for other general examination: Secondary | ICD-10-CM

## 2019-06-23 NOTE — Telephone Encounter (Signed)
Copied from [place] [id number]. Topic: General - Inquiry >> Jun 23, 2019  3:05 PM Mathis Bud wrote: Reason for CRM: Patient has future labs. Patient would like to add to her lab work to get her blood type and covid antibody. Patient would like a call back when labs have been added.  Call back [phone]

## 2019-06-24 NOTE — Telephone Encounter (Signed)
Spoke with patient and informed her of her 2 lab reqs. She gave verbal understanding

## 2019-06-24 NOTE — Telephone Encounter (Signed)
Labs added.  Just has to let phlebotomist know she has 2 reqs.

## 2019-07-17 ENCOUNTER — Other Ambulatory Visit: Payer: Self-pay | Admitting: Physician Assistant

## 2019-07-17 DIAGNOSIS — B009 Herpesviral infection, unspecified: Secondary | ICD-10-CM

## 2019-07-21 ENCOUNTER — Encounter: Payer: Self-pay | Admitting: Physician Assistant

## 2019-10-06 ENCOUNTER — Other Ambulatory Visit: Payer: Self-pay | Admitting: Physician Assistant

## 2019-10-06 DIAGNOSIS — I10 Essential (primary) hypertension: Secondary | ICD-10-CM

## 2019-10-06 NOTE — Telephone Encounter (Signed)
Requested Prescriptions  Pending Prescriptions Disp Refills  . losartan (COZAAR) 100 MG tablet [Pharmacy Med Name: LOSARTAN 100MG  TABLETS] 90 tablet 1    Sig: TAKE 1 TABLET(100 MG) BY MOUTH DAILY     Cardiovascular:  Angiotensin Receptor Blockers Failed - 10/06/2019 11:47 AM      Failed - Cr in normal range and within 180 days    Creatinine, Ser  Date Value Ref Range Status  03/28/2019 0.46 0.44 - 1.00 mg/dL Final         Failed - K in normal range and within 180 days    Potassium  Date Value Ref Range Status  03/28/2019 3.3 (L) 3.5 - 5.1 mmol/L Final         Failed - Last BP in normal range    BP Readings from Last 1 Encounters:  05/22/19 (!) 138/93         Failed - Valid encounter within last 6 months    Recent Outpatient Visits          4 months ago Annual physical exam   [facility] [place], Clearnce Sorrel, PA-C   6 months ago Encounter for support and coordination of transition of care   [place], Clearnce Sorrel, [place]   1 year ago Annual physical exam   [place], Clearnce Sorrel, [place]   3 years ago Annual physical exam   [facility] Fenton Malling M, [place]   3 years ago Encounter for weight loss counseling   [facility] Fenton Malling M, [place] - Patient is not pregnant

## 2019-10-09 ENCOUNTER — Other Ambulatory Visit: Payer: Self-pay

## 2019-10-09 DIAGNOSIS — I10 Essential (primary) hypertension: Secondary | ICD-10-CM

## 2019-10-09 MED ORDER — AMLODIPINE BESYLATE 10 MG PO TABS: 10.0000 mg | ORAL_TABLET | Freq: Every day | ORAL | 1 refills | Status: DC

## 2019-10-10 ENCOUNTER — Encounter: Payer: Self-pay | Admitting: Physician Assistant

## 2019-10-10 DIAGNOSIS — I10 Essential (primary) hypertension: Secondary | ICD-10-CM

## 2019-10-10 MED ORDER — AMLODIPINE BESYLATE 10 MG PO TABS: 10.0000 mg | ORAL_TABLET | Freq: Every day | ORAL | 1 refills | Status: DC

## 2019-10-10 NOTE — Telephone Encounter (Signed)
Pt called saying she does need the Amlodipine.  She said there seemed to be a mix up at the pharmacy.  She uses [facility] in [place],  CB#  [phone]

## 2019-10-10 NOTE — Addendum Note (Signed)
Addended by: Mar Daring on: 10/10/2019 06:00 PM   Modules accepted: Orders

## 2020-01-25 ENCOUNTER — Other Ambulatory Visit: Payer: Self-pay | Admitting: Physician Assistant

## 2020-01-25 DIAGNOSIS — B009 Herpesviral infection, unspecified: Secondary | ICD-10-CM

## 2020-01-25 NOTE — Telephone Encounter (Signed)
Requested Prescriptions  Pending Prescriptions Disp Refills  . acyclovir (ZOVIRAX) 400 MG tablet [Pharmacy Med Name: ACYCLOVIR 400MG  TABLETS] 180 tablet 1    Sig: TAKE 1 TABLET BY MOUTH TWICE DAILY     Antimicrobials:  Antiviral Agents - Anti-Herpetic Passed - 01/25/2020  7:46 AM      Passed - Valid encounter within last 12 months    Recent Outpatient Visits          8 months ago Annual physical exam   [facility] Mar Daring, [place]   9 months ago Encounter for support and coordination of transition of care   [place], Clearnce Sorrel, [place]   1 year ago Annual physical exam   [facility] [place], Clearnce Sorrel, [place]   3 years ago Annual physical exam   [facility] [place], Clearnce Sorrel, [place]   3 years ago Encounter for weight loss counseling   [place], [place]      Future Appointments            In 4 months [place], Clearnce Sorrel, PA-C [employer], [place]

## 2020-01-29 ENCOUNTER — Other Ambulatory Visit: Payer: Self-pay | Admitting: Physician Assistant

## 2020-01-29 ENCOUNTER — Ambulatory Visit: Payer: Self-pay | Admitting: *Deleted

## 2020-01-29 ENCOUNTER — Other Ambulatory Visit: Payer: Self-pay

## 2020-01-29 ENCOUNTER — Ambulatory Visit: Payer: Managed Care, Other (non HMO) | Admitting: Physician Assistant

## 2020-01-29 ENCOUNTER — Encounter: Payer: Self-pay | Admitting: Physician Assistant

## 2020-01-29 VITALS — BP 133/84 | HR 78 | Temp 98.5°F | Wt 248.8 lb

## 2020-01-29 DIAGNOSIS — Z8041 Family history of malignant neoplasm of ovary: Secondary | ICD-10-CM

## 2020-01-29 DIAGNOSIS — H9313 Tinnitus, bilateral: Secondary | ICD-10-CM

## 2020-01-29 DIAGNOSIS — R1013 Epigastric pain: Secondary | ICD-10-CM

## 2020-01-29 DIAGNOSIS — Z6839 Body mass index (BMI) 39.0-39.9, adult: Secondary | ICD-10-CM

## 2020-01-29 DIAGNOSIS — E7800 Pure hypercholesterolemia, unspecified: Secondary | ICD-10-CM

## 2020-01-29 MED ORDER — SUCRALFATE 1 G PO TABS: 1.0000 g | ORAL_TABLET | Freq: Three times a day (TID) | ORAL | 0 refills | Status: DC

## 2020-01-29 NOTE — Patient Instructions (Signed)
Gastritis, Adult Gastritis is inflammation of the stomach. There are two kinds of gastritis:  Acute gastritis. This kind develops suddenly.  Chronic gastritis. This kind is much more common and lasts for a long time. Gastritis happens when the lining of the stomach becomes weak or gets damaged. Without treatment, gastritis can lead to stomach bleeding and ulcers. What are the causes? This condition may be caused by:  An infection.  Drinking too much alcohol.  Certain medicines. These include steroids, antibiotics, and some over-the-counter medicines, such as aspirin or ibuprofen.  Having too much acid in the stomach.  A disease of the intestines or stomach.  Stress.  An allergic reaction.  Crohn's disease.  Some cancer treatments (radiation). Sometimes the cause of this condition is not known. What are the signs or symptoms? Symptoms of this condition include:  Pain or a burning sensation in the upper abdomen.  Nausea.  Vomiting.  An uncomfortable feeling of fullness after eating.  Weight loss.  Bad breath.  Blood in your vomit or stools. In some cases, there are no symptoms. How is this diagnosed? This condition may be diagnosed with:  Your medical history and a description of your symptoms.  A physical exam.  Tests. These can include: ? Blood tests. ? Stool tests. ? A test in which a thin, flexible instrument with a light and a camera is passed down the esophagus and into the stomach (upper endoscopy). ? A test in which a sample of tissue is taken for testing (biopsy). How is this treated? This condition may be treated with medicines. The medicines that are used vary depending on the cause of the gastritis:  If the condition is caused by a bacterial infection, you may be given antibiotic medicines.  If the condition is caused by too much acid in the stomach, you may be given medicines called H2 blockers, proton pump inhibitors, or antacids. Treatment  may also involve stopping the use of certain medicines, such as aspirin, ibuprofen, or other NSAIDs. Follow these instructions at home: Medicines  Take over-the-counter and prescription medicines only as told by your health care provider.  If you were prescribed an antibiotic medicine, take it as told by your health care provider. Do not stop taking the antibiotic even if you start to feel better. Eating and drinking   Eat small, frequent meals instead of large meals.  Avoid foods and drinks that make your symptoms worse.  Drink enough fluid to keep your urine pale yellow. Alcohol use  Do not drink alcohol if: ? Your health care provider tells you not to drink. ? You are pregnant, may be pregnant, or are planning to become pregnant.  If you drink alcohol: ? Limit your use to:  0-1 drink a day for women.  0-2 drinks a day for men. ? Be aware of how much alcohol is in your drink. In the [place] one drink equals one 12 oz bottle of beer (355 mL), one 5 oz glass of wine (148 mL), or one 1 oz glass of hard liquor (44 mL). General instructions  Talk with your health care provider about ways to manage stress, such as getting regular exercise or practicing deep breathing, meditation, or yoga.  Do not use any products that contain nicotine or tobacco, such as cigarettes and e-cigarettes. If you need help quitting, ask your health care provider.  Keep all follow-up visits as told by your health care provider. This is important. Contact a health care provider if:  Your   symptoms get worse.  Your symptoms return after treatment. Get help right away if:  You vomit blood or material that looks like coffee grounds.  You have black or dark red stools.  You are unable to keep fluids down.  Your abdominal pain gets worse.  You have a fever.  You do not feel better after one week. Summary  Gastritis is inflammation of the lining of the stomach that can occur suddenly (acute) or  develop slowly over time (chronic).  This condition is diagnosed with a medical history, a physical exam, or tests.  This condition may be treated with medicines to treat infection or medicines to reduce the amount of acid in your stomach.  Follow your health care provider's instructions about taking medicines, making changes to your diet, and knowing when to call for help. This information is not intended to replace advice given to you by your health care provider. Make sure you discuss any questions you have with your health care provider. Document Revised: 11/06/2017 Document Reviewed: 11/06/2017 Elsevier Patient Education  2020 Elsevier Inc.  

## 2020-01-29 NOTE — Telephone Encounter (Signed)
Pt called in c/o abd pain all the way across her abd under her ribs.   Started Saturday the pain but she has been feeling full for a few days prior to that.  The agent had made her an appt today at 2:00 to be seen.   I went through the triage questions she did not meet criteria for going to the ED so I advised her to keep the 2:00 appt today which she was agreeable to.  I sent my notes to [facility].   Reason for Disposition . [1] MODERATE pain (e.g., interferes with normal activities) AND [2] pain comes and goes (cramps) AND [3] present > 24 hours  (Exception: pain with Vomiting or Diarrhea - see that Guideline)  Answer Assessment - Initial Assessment Questions 1. LOCATION: "Where does it hurt?"      It hurts across whole abd under  My ribs. 2. RADIATION: "Does the pain shoot anywhere else?" (e.g., chest, back)     It's across my whole abd. Under my ribs.  More tender in the middle. I have a history of hernia where they removed part of my colon with blockages requiring NG tubes. 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 3. ONSET: "When did the pain begin?" (e.g., minutes, hours or days ago)      I think Saturday. 4. SUDDEN: "Gradual or sudden onset?"     I have a fullness feeling that was gradual but the pain started Saturday. 5. PATTERN "Does the pain come and go, or is it constant?"    - If constant: "Is it getting better, staying the same, or worsening?"      (Note: Constant means the pain never goes away completely; most serious pain is constant and it progresses)     - If intermittent: "How long does it last?" "Do you have pain now?"     (Note: Intermittent means the pain goes away completely between bouts)     Sat and Sun constant but now it's intermittent. 6. SEVERITY: "How bad is the pain?"  (e.g., Scale 1-10; mild, moderate, or severe)   - MILD (1-3): doesn't interfere with normal activities, abdomen soft and not tender to touch    - MODERATE (4-7):  interferes with normal activities or awakens from sleep, tender to touch    - SEVERE (8-10): excruciating pain, doubled over, unable to do any normal activities      Moderate 7. RECURRENT SYMPTOM: "Have you ever had this type of stomach pain before?" If Yes, ask: "When was the last time?" and "What happened that time?"      Yes  I've had blockages requiring NG tube before. 8. CAUSE: "What do you think is causing the stomach pain?"     I'm not sure.  I hope I don't have a blockage. 9. RELIEVING/AGGRAVATING FACTORS: "What makes it better or worse?" (e.g., movement, antacids, bowel movement)     It's just intermittent the pain but I'm full feeling most of the time. 10. OTHER SYMPTOMS: "Has there been any vomiting, diarrhea, constipation, or urine problems?"       None. 11. PREGNANCY: "Is there any chance you are pregnant?" "When was your last menstrual period?"       N/A due to age  Protocols used: ABDOMINAL PAIN - [facility]

## 2020-01-29 NOTE — Telephone Encounter (Signed)
May only need x 1 month

## 2020-01-29 NOTE — Telephone Encounter (Signed)
° °  Notes to clinic:  Patient requests 90 days supply   Requested Prescriptions  Pending Prescriptions Disp Refills   sucralfate (CARAFATE) 1 g tablet [Pharmacy Med Name: SUCRALFATE 1GM TABLETS] 360 tablet     Sig: TAKE 1 TABLET(1 GRAM) BY MOUTH FOUR TIMES DAILY AT BEDTIME WITH MEALS      Gastroenterology: Antiacids Passed - 01/29/2020  3:05 PM      Passed - Valid encounter within last 12 months    Recent Outpatient Visits           Today Epigastric pain   [facility] Fenton Malling M, [place]   8 months ago Annual physical exam   [facility] Fenton Malling M, [place]   10 months ago Encounter for support and coordination of transition of care   [place], Clearnce Sorrel, [place]   1 year ago Annual physical exam   [facility] [place], Clearnce Sorrel, [place]   3 years ago Annual physical exam   [place], Clearnce Sorrel, [place]       Future Appointments             In 3 months [place], Clearnce Sorrel, PA-C [employer], [place]

## 2020-01-29 NOTE — Progress Notes (Signed)
Established patient visit   Patient: Rita Foster   DOB: 04-27-1961   59 y.o. Female  MRN: [id number] Visit Date: 01/29/2020  Today's healthcare provider: Mar Daring, PA-C   Chief Complaint  Patient presents with  . Abdominal Pain   Mertie Moores as a scribe for [employer], PA-C.,have documented all relevant documentation on the behalf of Mar Daring, PA-C,as directed by  Mar Daring, PA-C while in the presence of Mar Daring, PA-C.  Subjective    Abdominal Pain This is a new problem. Episode onset: 5 days ago. The onset quality is gradual. The problem occurs constantly. The problem has been waxing and waning. Pain location: upper abdomen across her ribs. The pain is aggravated by movement. Relieved by: sitting still. Treatments tried: Criss Rosales diet  The treatment provided mild relief. PMH includes: hernia surgery and bowel obstruction     Patient Active Problem List   Diagnosis Date Noted  . SBO (small bowel obstruction) ([place]) 03/25/2019  . Absence of menstruation 04/02/2015  . Obese 04/02/2015  . Hypercholesteremia 04/02/2015  . Symptomatic menopausal or female climacteric states 04/02/2015  . 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 Herpes 06/19/2008  . Asthma 09/14/2006  . Arthralgia of lower leg 09/14/2006   History reviewed. No pertinent past medical history.     Medications: Outpatient Medications Prior to Visit  Medication Sig  . acetaminophen (TYLENOL) 500 MG tablet Take 650 mg by mouth at bedtime as needed for mild pain or moderate pain.   Marland Kitchen acyclovir (ZOVIRAX) 400 MG tablet TAKE 1 TABLET BY MOUTH TWICE DAILY  . amLODipine (NORVASC) 10 MG tablet Take 1 tablet (10 mg total) by mouth daily.  Marland Kitchen aspirin EC 81 MG tablet Take 81 mg by mouth every evening.  . Blood Pressure Monitoring (BLOOD PRESSURE MONITOR/WRIST) DEVI To check bp daily  . diclofenac Sodium (VOLTAREN) 1 % GEL Apply 2 g  topically in the morning and at bedtime.  Marland Kitchen losartan (COZAAR) 100 MG tablet TAKE 1 TABLET(100 MG) BY MOUTH DAILY  . meloxicam (MOBIC) 15 MG tablet Take 15 mg by mouth daily.  . Misc Natural Products (OSTEO BI-FLEX JOINT SHIELD) TABS Take 2 tablets by mouth daily at 12 noon.   . Multiple Vitamin tablet Take 1 tablet by mouth daily.   . NON FORMULARY rawleigh medicated ointment  . Omega-3 300 MG CAPS Take by mouth.  . Probiotic Product (PROBIOTIC PO) Take by mouth.  . Turmeric 500 MG CAPS Take 1,000 mg by mouth daily at 12 noon.  . [DISCONTINUED] lidocaine (LMX) 4 % cream Apply 1 application topically as needed.  . [DISCONTINUED] Menthol-Methyl Salicylate (MUSCLE RUB EX) Apply topically.  . [DISCONTINUED] naproxen sodium (ALEVE) 220 MG tablet Take 440 mg by mouth daily as needed (pain).  . [DISCONTINUED] Omega-3 Fatty Acids (FISH OIL) 1000 MG CAPS Take 2,000 mg by mouth daily at 12 noon.   No facility-administered medications prior to visit.    Review of Systems  Constitutional: Negative.   Respiratory: Negative.   Cardiovascular: Negative.   Gastrointestinal: Positive for abdominal distention and abdominal pain.  Musculoskeletal: Negative.     Last CBC Lab Results  Component Value Date   WBC 11.6 (H) 01/29/2020   HGB 12.7 01/29/2020   HCT 38.3 01/29/2020   MCV 93 01/29/2020   MCH 30.8 01/29/2020   RDW 13.0 01/29/2020   PLT 296 71/69/6789   Last metabolic panel Lab Results  Component  Value Date   GLUCOSE 93 01/29/2020   NA 140 01/29/2020   K 4.3 01/29/2020   CL 103 01/29/2020   CO2 22 01/29/2020   BUN 17 01/29/2020   CREATININE 0.69 01/29/2020   GFRNONAA 96 01/29/2020   GFRAA 110 01/29/2020   CALCIUM 9.2 01/29/2020   PROT 6.6 01/29/2020   ALBUMIN 4.2 01/29/2020   LABGLOB 2.4 01/29/2020   AGRATIO 1.8 01/29/2020   BILITOT 0.4 01/29/2020   ALKPHOS 72 01/29/2020   AST 17 01/29/2020   ALT 16 01/29/2020   ANIONGAP 6 03/28/2019      Objective    BP (!) 133/84 (BP  Location: Left Arm, Patient Position: Sitting, Cuff Size: Large)   Pulse 78   Temp 98.5 F (36.9 C) (Temporal)   Wt (!) 248 lb 12.8 oz (112.9 kg)   BMI 39.56 kg/m  BP Readings from Last 3 Encounters:  01/29/20 (!) 133/84  05/22/19 (!) 138/93  04/04/19 140/86   Wt Readings from Last 3 Encounters:  01/29/20 (!) 248 lb 12.8 oz (112.9 kg)  05/22/19 252 lb (114.3 kg)  04/04/19 264 lb (119.7 kg)      Physical Exam Constitutional:      General: She is not in acute distress.    Appearance: Normal appearance. She is well-developed. She is obese. She is not ill-appearing or diaphoretic.  Cardiovascular:     Rate and Rhythm: Normal rate and regular rhythm.     Heart sounds: Normal heart sounds. No murmur heard.  No friction rub. No gallop.   Pulmonary:     Effort: Pulmonary effort is normal. No respiratory distress.     Breath sounds: Normal breath sounds. No wheezing or rales.  Abdominal:     General: Abdomen is flat. Bowel sounds are normal. There is no distension.     Palpations: Abdomen is soft. There is no mass.     Tenderness: There is abdominal tenderness in the epigastric area. There is no right CVA tenderness, left CVA tenderness, guarding or rebound.  Skin:    General: Skin is warm and dry.  Neurological:     Mental Status: She is alert and oriented to person, place, and time.       Results for orders placed or performed in visit on 01/29/20  CBC with Differential/Platelet  Result Value Ref Range   WBC 11.6 (H) 3.4 - 10.8 x10E3/uL   RBC 4.12 3.77 - 5.28 x10E6/uL   Hemoglobin 12.7 11.1 - 15.9 g/dL   Hematocrit 38.3 34.0 - 46.6 %   MCV 93 79 - 97 fL   MCH 30.8 26.6 - 33.0 pg   MCHC 33.2 31 - 35 g/dL   RDW 13.0 11.7 - 15.4 %   Platelets 296 150 - 450 x10E3/uL   Neutrophils 68 Not Estab. %   Lymphs 21 Not Estab. %   Monocytes 9 Not Estab. %   Eos 2 Not Estab. %   Basos 0 Not Estab. %   Neutrophils Absolute 7.9 (H) 1 - 7 x10E3/uL   Lymphocytes Absolute 2.5 0 - 3  x10E3/uL   Monocytes Absolute 1.0 (H) 0 - 0 x10E3/uL   EOS (ABSOLUTE) 0.2 0.0 - 0.4 x10E3/uL   Basophils Absolute 0.1 0 - 0 x10E3/uL   Immature Granulocytes 0 Not Estab. %   Immature Grans (Abs) 0.0 0.0 - 0.1 x10E3/uL  Comprehensive metabolic panel  Result Value Ref Range   Glucose 93 65 - 99 mg/dL   BUN 17 6 - 24 mg/dL  Creatinine, Ser 0.69 0.57 - 1.00 mg/dL   GFR calc non Af Amer 96 >59 mL/min/1.73   GFR calc Af Amer 110 >59 mL/min/1.73   BUN/Creatinine Ratio 25 (H) 9 - 23   Sodium 140 134 - 144 mmol/L   Potassium 4.3 3.5 - 5.2 mmol/L   Chloride 103 96 - 106 mmol/L   CO2 22 20 - 29 mmol/L   Calcium 9.2 8.7 - 10.2 mg/dL   Total Protein 6.6 6.0 - 8.5 g/dL   Albumin 4.2 3.8 - 4.9 g/dL   Globulin, Total 2.4 1.5 - 4.5 g/dL   Albumin/Globulin Ratio 1.8 1.2 - 2.2   Bilirubin Total 0.4 0.0 - 1.2 mg/dL   Alkaline Phosphatase 72 48 - 121 IU/L   AST 17 0 - 40 IU/L   ALT 16 0 - 32 IU/L  Lipid Panel With LDL/HDL Ratio  Result Value Ref Range   Cholesterol, Total 216 (H) 100 - 199 mg/dL   Triglycerides 122 0 - 149 mg/dL   HDL 52 >39 mg/dL   VLDL Cholesterol Cal 22 5 - 40 mg/dL   LDL Chol Calc (NIH) 142 (H) 0 - 99 mg/dL   LDL/HDL Ratio 2.7 0.0 - 3.2 ratio  Hemoglobin A1c  Result Value Ref Range   Hgb A1c MFr Bld 5.6 4.8 - 5.6 %   Est. average glucose Bld gHb Est-mCnc 114 mg/dL  ABO AND RH   Result Value Ref Range   ABO Grouping O    Rh Factor Positive   CA 125  Result Value Ref Range   Cancer Antigen (CA) 125 23.0 0.0 - 38.1 U/mL  TSH  Result Value Ref Range   TSH 1.060 0.450 - 4.500 uIU/mL  SARS-CoV-2 Antibodies  Result Value Ref Range   SARS-CoV-2 Antibodies Negative Negative  Lipase  Result Value Ref Range   Lipase 22 14 - 72 U/L    Assessment & Plan     1. Epigastric pain Suspect gastritis, most likely from NSAIDs. Discussed stopping NSAIDs orally. Sucralfate 1g ACHS prescribed to patient. Call if not improving or worsening.  - Comprehensive Metabolic Panel  (CMET) - Lipase - CBC w/Diff/Platelet  2. Tinnitus of both ears New and becoming more prominent. Will refer to ENT for evaluation and hearing test.  - Ambulatory referral to ENT  3. Hypercholesteremia Diet controlled. Will check labs as below and f/u pending results. - TSH - Lipid Panel With LDL/HDL Ratio - HgB A1c  4. Family history of ovarian cancer Will check labs as below and f/u pending results. - CA 125  5. Class 2 severe obesity due to excess calories with serious comorbidity and body mass index (BMI) of 39.0 to 39.9 in adult [facility]) Counseled patient on healthy lifestyle modifications including dieting and exercise. Doing well and continues to lose weight. Will check labs as below and f/u pending results. - TSH - HgB A1c   Return in about 4 weeks (around 02/26/2020), or if symptoms worsen or fail to improve.      Reynolds Bowl, PA-C, have reviewed all documentation for this visit. The documentation on 02/03/20 for the exam, diagnosis, procedures, and orders are all accurate and complete.   Rubye Beach  [facility] [phone] (phone) [phone] (fax)  [place]

## 2020-01-30 ENCOUNTER — Telehealth: Payer: Self-pay

## 2020-01-30 LAB — COMPREHENSIVE METABOLIC PANEL
ALT: 16 IU/L (ref 0–32)
AST: 17 IU/L (ref 0–40)
Albumin/Globulin Ratio: 1.8 (ref 1.2–2.2)
Albumin: 4.2 g/dL (ref 3.8–4.9)
Alkaline Phosphatase: 72 IU/L (ref 48–121)
BUN/Creatinine Ratio: 25 — ABNORMAL HIGH (ref 9–23)
BUN: 17 mg/dL (ref 6–24)
Bilirubin Total: 0.4 mg/dL (ref 0.0–1.2)
CO2: 22 mmol/L (ref 20–29)
Calcium: 9.2 mg/dL (ref 8.7–10.2)
Chloride: 103 mmol/L (ref 96–106)
Creatinine, Ser: 0.69 mg/dL (ref 0.57–1.00)
GFR calc Af Amer: 110 mL/min/{1.73_m2} (ref >59)
GFR calc non Af Amer: 96 mL/min/{1.73_m2} (ref >59)
Globulin, Total: 2.4 g/dL (ref 1.5–4.5)
Glucose: 93 mg/dL (ref 65–99)
Potassium: 4.3 mmol/L (ref 3.5–5.2)
Sodium: 140 mmol/L (ref 134–144)
Total Protein: 6.6 g/dL (ref 6.0–8.5)

## 2020-01-30 LAB — CBC WITH DIFFERENTIAL/PLATELET
Basophils Absolute: 0.1 10*3/uL (ref 0.0–0.2)
Basos: 0 %
EOS (ABSOLUTE): 0.2 10*3/uL (ref 0.0–0.4)
Eos: 2 %
Hematocrit: 38.3 % (ref 34.0–46.6)
Hemoglobin: 12.7 g/dL (ref 11.1–15.9)
Immature Grans (Abs): 0 10*3/uL (ref 0.0–0.1)
Immature Granulocytes: 0 %
Lymphocytes Absolute: 2.5 10*3/uL (ref 0.7–3.1)
Lymphs: 21 %
MCH: 30.8 pg (ref 26.6–33.0)
MCHC: 33.2 g/dL (ref 31.5–35.7)
MCV: 93 fL (ref 79–97)
Monocytes Absolute: 1 10*3/uL — ABNORMAL HIGH (ref 0.1–0.9)
Monocytes: 9 %
Neutrophils Absolute: 7.9 10*3/uL — ABNORMAL HIGH (ref 1.4–7.0)
Neutrophils: 68 %
Platelets: 296 10*3/uL (ref 150–450)
RBC: 4.12 x10E6/uL (ref 3.77–5.28)
RDW: 13 % (ref 11.7–15.4)
WBC: 11.6 10*3/uL — ABNORMAL HIGH (ref 3.4–10.8)

## 2020-01-30 LAB — HEMOGLOBIN A1C
Est. average glucose Bld gHb Est-mCnc: 114 mg/dL
Hgb A1c MFr Bld: 5.6 % (ref 4.8–5.6)

## 2020-01-30 LAB — CA 125: Cancer Antigen (CA) 125: 23 U/mL (ref 0.0–38.1)

## 2020-01-30 LAB — LIPID PANEL WITH LDL/HDL RATIO
Cholesterol, Total: 216 mg/dL — ABNORMAL HIGH (ref 100–199)
HDL: 52 mg/dL (ref >39)
LDL Chol Calc (NIH): 142 mg/dL — ABNORMAL HIGH (ref 0–99)
LDL/HDL Ratio: 2.7 ratio (ref 0.0–3.2)
Triglycerides: 122 mg/dL (ref 0–149)
VLDL Cholesterol Cal: 22 mg/dL (ref 5–40)

## 2020-01-30 LAB — LIPASE: Lipase: 22 U/L (ref 14–72)

## 2020-01-30 LAB — ABO AND RH: Rh Factor: POSITIVE

## 2020-01-30 LAB — TSH: TSH: 1.06 u[IU]/mL (ref 0.450–4.500)

## 2020-01-30 LAB — SARS-COV-2 ANTIBODIES: SARS-CoV-2 Antibodies: NEGATIVE

## 2020-01-30 NOTE — Telephone Encounter (Signed)
White blood cell count is borderline high which could be secondary to the gastritis. Hemoglobin is stable and normal so no blood loss occurring which is good. Kidney and liver function are normal. Sodium, potassium and calcium are normal. Cholesterol borderline elevated. Slight increase compared to last year. A1c/sugar is normal. Blood type is O positive. CA125 is normal. Thyroid is normal. Covid antibodies for acute or previous infection is negative. This did not test for antibodies created from vaccination. Lipase (pancreatic enzyme) is normal.  Written by Mar Daring, PA-C on 01/30/2020 1:23 PM EDT Seen by patient Rita Foster on 01/30/2020 2:27 PM

## 2020-01-30 NOTE — Telephone Encounter (Signed)
-----   Message from Mar Daring, [place] sent at 01/30/2020  1:23 PM EDT ----- White blood cell count is borderline high which could be secondary to the gastritis. Hemoglobin is stable and normal so no blood loss occurring which is good. Kidney and liver function are normal. Sodium, potassium and calcium are normal. Cholesterol borderline elevated. Slight increase compared to last year. A1c/sugar is normal. Blood type is O positive. CA125 is normal. Thyroid is normal. Covid antibodies for acute or previous infection is negative. This did not test for antibodies created from vaccination. Lipase (pancreatic enzyme) is normal.

## 2020-02-11 ENCOUNTER — Other Ambulatory Visit: Payer: Self-pay

## 2020-02-11 ENCOUNTER — Ambulatory Visit
Admission: RE | Admit: 2020-02-11 | Discharge: 2020-02-11 | Disposition: A | Discharge status: Home / Self Care | Payer: Managed Care, Other (non HMO) | Source: Ambulatory Visit | Attending: Physician Assistant | Admitting: Physician Assistant

## 2020-02-11 DIAGNOSIS — Z1231 Encounter for screening mammogram for malignant neoplasm of breast: Secondary | ICD-10-CM | POA: Insufficient documentation

## 2020-02-11 DIAGNOSIS — Z1239 Encounter for other screening for malignant neoplasm of breast: Secondary | ICD-10-CM

## 2020-02-26 ENCOUNTER — Encounter: Payer: Self-pay | Admitting: Physician Assistant

## 2020-02-26 DIAGNOSIS — K56609 Unspecified intestinal obstruction, unspecified as to partial versus complete obstruction: Secondary | ICD-10-CM

## 2020-02-26 DIAGNOSIS — Z9884 Bariatric surgery status: Secondary | ICD-10-CM

## 2020-02-26 DIAGNOSIS — R1013 Epigastric pain: Secondary | ICD-10-CM

## 2020-03-05 ENCOUNTER — Encounter: Payer: Self-pay | Admitting: Physician Assistant

## 2020-03-05 DIAGNOSIS — Z9884 Bariatric surgery status: Secondary | ICD-10-CM | POA: Insufficient documentation

## 2020-03-05 NOTE — Addendum Note (Signed)
Addended by: Mar Daring on: 03/05/2020 02:27 PM   Modules accepted: Orders

## 2020-03-09 ENCOUNTER — Telehealth: Payer: Self-pay | Admitting: Physician Assistant

## 2020-03-09 DIAGNOSIS — R1084 Generalized abdominal pain: Secondary | ICD-10-CM

## 2020-03-09 NOTE — Telephone Encounter (Signed)
Copied from [place] [id number]. Topic: General - Other >> Mar 09, 2020  3:00 PM Keene Breath wrote: Reason for CRM: Called to inform the doctor that the doctor she sent in the referral for would not have an appt. Until November.  Patient said she has to see someone before that.  Please advise and call patient to discuss at [phone]

## 2020-03-09 NOTE — Telephone Encounter (Signed)
Unfortunately all GI doctors are probably booked out due to many patients waiting until end of year for colonoscopies.   Does she have a preference on any other GI provider?

## 2020-03-10 NOTE — Telephone Encounter (Signed)
Pt states she has no preference of a dr, she wants someone that will see her soon., and someone that has dealt with people like her, that has had the health issues she has...someone who Sonia Baller has confidence in. Also she also wants to speak with someone that can tell her what she can eat. Pt is eating plain bland diet.  Pt also concerned this may be a hernia. May not be all food related, it seems to get worse with physical activity and when she gets more active.  Pt hopes to hear back something asap.

## 2020-03-11 NOTE — Telephone Encounter (Signed)
Patient advised as directed below. 

## 2020-03-11 NOTE — Telephone Encounter (Signed)
She needs to avoid fatty foods, fried foods, red meats, processed meats. Limit foods that are harder to digest like raw greens and vegetables. Soft, bland foods are probably best until pain subsides

## 2020-03-11 NOTE — Telephone Encounter (Signed)
Placed new referral to see if anyone can see her sooner  I have also ordered at new CT since pain is not improving.

## 2020-03-11 NOTE — Addendum Note (Signed)
Addended by: Mar Daring on: 03/11/2020 08:18 AM   Modules accepted: Orders

## 2020-03-11 NOTE — Telephone Encounter (Signed)
Patient advised as directed below. Reports that she went to her ENT appointment for her hearing and her hearing but her hearing got better after stopping the MEloxicam. ENT doctor (Dr. Barton Fanny) told her that most likely the  tinnitus was caused by the NSAID and he prescribed omeprazole DR 20 mg to help with her stomach pain. She still eating Bland foods and she reports that after she eats her stomach feels good for an hour or so and then it start to hurts for a couple hours or until she eats again. Reports that is better during the night. She has stopped caffeinated drinks. She is only drinking apple juice and water.What food diet can she do on the meantime she waits for the GI appointment? GI only would tell her until she sees them and was told to asked her PCP and stated "Tawanna Sat doesn't want to tell me what to eat until I see GI" she doesn't want to goggle things because one day it tells her what to eat and then later on it says she shouldn't be doing that. States "I just don't know what to do.

## 2020-03-15 ENCOUNTER — Encounter: Payer: Self-pay | Admitting: Physician Assistant

## 2020-03-23 ENCOUNTER — Ambulatory Visit: Payer: Managed Care, Other (non HMO)

## 2020-03-29 ENCOUNTER — Other Ambulatory Visit: Payer: Self-pay | Admitting: Physician Assistant

## 2020-03-29 DIAGNOSIS — I10 Essential (primary) hypertension: Secondary | ICD-10-CM

## 2020-03-29 NOTE — Telephone Encounter (Signed)
Requested Prescriptions  Pending Prescriptions Disp Refills  . losartan (COZAAR) 100 MG tablet [Pharmacy Med Name: LOSARTAN 100MG  TABLETS] 90 tablet 1    Sig: TAKE 1 TABLET(100 MG) BY MOUTH DAILY     Cardiovascular:  Angiotensin Receptor Blockers Passed - 03/29/2020 11:19 AM      Passed - Cr in normal range and within 180 days    Creatinine, Ser  Date Value Ref Range Status  01/29/2020 0.69 0.57 - 1.00 mg/dL Final         Passed - K in normal range and within 180 days    Potassium  Date Value Ref Range Status  01/29/2020 4.3 3.5 - 5.2 mmol/L Final         Passed - Patient is not pregnant      Passed - Last BP in normal range    BP Readings from Last 1 Encounters:  01/29/20 (!) 133/84         Passed - Valid encounter within last 6 months    Recent Outpatient Visits          2 months ago Epigastric pain   [facility] Fenton Malling M, [place]   10 months ago Annual physical exam   [facility] Fenton Malling M, [place]   12 months ago Encounter for support and coordination of transition of care   [place], Clearnce Sorrel, [place]   2 years ago Annual physical exam   [place], Clearnce Sorrel, [place]   3 years ago Annual physical exam   [place], Clearnce Sorrel, [place]      Future Appointments            In 1 month Burnette, Clearnce Sorrel, PA-C [employer], [place]

## 2020-04-14 ENCOUNTER — Telehealth: Payer: Self-pay | Admitting: Physician Assistant

## 2020-04-14 NOTE — Telephone Encounter (Signed)
Patient advised as directed below. 

## 2020-04-14 NOTE — Telephone Encounter (Signed)
She could always cancel this one and just wait and see if Dr. Allen Norris is going to need it. If so he can re-order for her. She would just be stuck waiting again.

## 2020-04-14 NOTE — Telephone Encounter (Signed)
Pt is sch for ct abd pelvis scan w/contrast on 04-22-2020 and GI consult with dr Allen Norris on 05-20-2020. Pt would like to know if ct scan is necessary or can she skip ct scan and just see GI specialist

## 2020-04-21 ENCOUNTER — Telehealth: Payer: Self-pay

## 2020-04-21 NOTE — Telephone Encounter (Signed)
Copied from [place] [id number]. Topic: General - Other >> Apr 21, 2020  4:19 PM Alanda Slim E wrote: Reason for CRM: Pt asked if her employer mandates her to be vaccinated, with her med history is there any medical reasoning that Anderson Malta can provide her employer on why she shouldn't get vaccinated?/ and she also asked if not and Anderson Malta feels it would be safe to be vaccinated, which one should she get?/ please advise

## 2020-04-22 ENCOUNTER — Ambulatory Visit: Admission: RE | Admit: 2020-04-22 | Payer: Managed Care, Other (non HMO) | Source: Ambulatory Visit

## 2020-04-22 NOTE — Telephone Encounter (Signed)
Pt advised.   Thanks,   -Daizy Outen  

## 2020-04-22 NOTE — Telephone Encounter (Signed)
There are no reasons I know of for why she should not get vaccinated.   I would recommend Pfizer or Moderna. Both are pretty comparable. We offer Pfizer at our office and she could be put on the schedule if desired.

## 2020-05-20 ENCOUNTER — Encounter: Payer: Self-pay | Admitting: Gastroenterology

## 2020-05-20 ENCOUNTER — Ambulatory Visit: Payer: Managed Care, Other (non HMO) | Admitting: Gastroenterology

## 2020-05-20 ENCOUNTER — Other Ambulatory Visit: Payer: Self-pay

## 2020-05-20 VITALS — BP 147/92 | HR 78 | Ht 68.0 in | Wt 255.2 lb

## 2020-05-20 DIAGNOSIS — R1013 Epigastric pain: Secondary | ICD-10-CM | POA: Diagnosis not present

## 2020-05-20 NOTE — Progress Notes (Signed)
Gastroenterology Consultation  Referring Provider:     Florian Buff* Primary Care Physician:  Mar Daring, PA-C Primary Gastroenterologist:  Dr. Allen Norris     Reason for Consultation:     Abdominal pain        HPI:   Rita Foster is a 59 y.o. y/o female referred for consultation & management of Abdominal pain by Dr. Marlyn Corporal, Clearnce Sorrel, PA-C.  This patient comes in and reports that she had abdominal pain.  The abdominal pain was brought up to her primary care provider who put her on Carafate.  The patient states that she take it 4 times a day with very little relief.  She admits that she thinks that her symptoms were caused by her meloxicam that she was taking for her knees.  There is no report of any black stools or bloody stools.  She also denies any nausea vomiting.  The patient was then seen by ENT for hearing issues and was started on omeprazole.  The patient states that her symptoms have completely resolved at this point.  She has stopped the meloxicam.  There is no report of any unexplained weight loss fevers or chills.  The patient does report that when she started taking omeprazole even though his symptoms away she now has some urgency when she has been better usually has some abdominal pain the morning hollowed by a few frequent soft bowel movements that resolves after the morning. The patient has a history of small bowel obstruction in the past but is not having any symptoms at that at the present time.  No past medical history on file.  Past Surgical History:  Procedure Laterality Date  . BREAST BIOPSY Left 10+ years ago   Negative core  . BREAST SURGERY  2002   biopsy  . CATARACT EXTRACTION Right 07/2014  . HERNIA REPAIR  02/21/2010  . TONSILLECTOMY AND ADENOIDECTOMY  1968    Prior to Admission medications   Medication Sig Start Date End Date Taking? Authorizing Provider  acetaminophen (TYLENOL) 500 MG tablet Take 650 mg by mouth at bedtime as needed  for mild pain or moderate pain.    Yes [provider]  acyclovir (ZOVIRAX) 400 MG tablet TAKE 1 TABLET BY MOUTH TWICE DAILY 01/25/20  Yes Fenton Malling M, PA-C  amLODipine (NORVASC) 10 MG tablet Take 1 tablet (10 mg total) by mouth daily. 10/10/19  Yes Mar Daring, PA-C  Blood Pressure Monitoring (BLOOD PRESSURE MONITOR/WRIST) DEVI To check bp daily 04/04/19  Yes Mar Daring, PA-C  diclofenac Sodium (VOLTAREN) 1 % GEL Apply 2 g topically in the morning and at bedtime.   Yes [provider]  losartan (COZAAR) 100 MG tablet TAKE 1 TABLET(100 MG) BY MOUTH DAILY 03/29/20  Yes Burnette, Clearnce Sorrel, PA-C  Misc Natural Products (OSTEO BI-FLEX JOINT SHIELD) TABS Take 2 tablets by mouth daily at 12 noon.    Yes [provider]  Multiple Vitamin tablet Take 1 tablet by mouth daily.    Yes [provider]  NON FORMULARY rawleigh medicated ointment   Yes [provider]  Probiotic Product (PROBIOTIC PO) Take by mouth.   Yes [provider]  Turmeric 500 MG CAPS Take 1,000 mg by mouth daily at 12 noon.   Yes [provider]  aspirin EC 81 MG tablet Take 81 mg by mouth every evening.    [provider]  meloxicam (MOBIC) 15 MG tablet Take 15 mg by  mouth daily.    [provider]  Omega-3 300 MG CAPS Take by mouth.    [provider]  sucralfate (CARAFATE) 1 g tablet TAKE 1 TABLET(1 GRAM) BY MOUTH FOUR TIMES DAILY AT BEDTIME WITH MEALS 01/29/20   Mar Daring, PA-C    Family History  Problem Relation Age of Onset  . Crohn's disease Mother   . Thyroid disease Mother   . Rheum arthritis Mother   . Breast cancer Mother 48  . Hypertension Father   . Skin cancer Father      Social History   Tobacco Use  . Smoking status: Former Smoker    Years: 5.00    Quit date: 07/03/1999    Years since quitting: 20.8  . Smokeless tobacco: Never Used  Substance Use Topics  . Alcohol use: No  . Drug  use: No    Allergies as of 05/20/2020 - Review Complete 05/20/2020  Allergen Reaction Noted  . Penicillins  04/02/2015    Review of Systems:    All systems reviewed and negative except where noted in HPI.   Physical Exam:  BP (!) 147/92 (BP Location: Left Arm, Patient Position: Sitting, Cuff Size: Large)   Pulse 78   Ht 5\' 8"  (1.727 m)   Wt 255 lb 3.2 oz (115.8 kg)   BMI 38.80 kg/m  No LMP recorded. Patient is postmenopausal. General:   Alert,  Well-developed, well-nourished, pleasant and cooperative in NAD Head:  Normocephalic and atraumatic. Eyes:  Sclera clear, no icterus.   Conjunctiva pink. Ears:  Normal auditory acuity. Neck:  Supple; no masses or thyromegaly. Lungs:  Respirations even and unlabored.  Clear throughout to auscultation.   No wheezes, crackles, or rhonchi. No acute distress. Heart:  Regular rate and rhythm; no murmurs, clicks, rubs, or gallops. Abdomen:  Normal bowel sounds.  No bruits.  Soft, non-tender and non-distended without masses, hepatosplenomegaly or hernias noted.  No guarding or rebound tenderness.  Negative Carnett sign.   Rectal:  Deferred.  Pulses:  Normal pulses noted. Extremities:  No clubbing or edema.  No cyanosis. Neurologic:  Alert and oriented x3;  grossly normal neurologically. Skin:  Intact without significant lesions or rashes.  No jaundice. Lymph Nodes:  No significant cervical adenopathy. Psych:  Alert and cooperative. Normal mood and affect.  Imaging Studies: No results found.  Assessment and Plan:   Rita Foster is a 59 y.o. y/o female who comes in today with a history of abdominal pain that she states got better with a PPI. The symptoms were first present after the patient was taking meloxicam.  The patient is now off of all NSAIDs and doing well.  The patient has been told to stop the PPI and see if her symptoms return.  If they do I have given her samples of Dexilant to take and see if her morning abdominal pain with  urgency occurs with Dexilant as it does with omeprazole. The patient had a colonoscopy in 2019 by Dr. Vira Agar and does not need another until 2024 because of a family history of colon polyps and her sister before the age of 12. The patient has been explained the plan and agrees with it.    Lucilla Lame, MD. Marval Regal    Note: This dictation was prepared with Dragon dictation along with smaller phrase technology. Any transcriptional errors that result from this process are unintentional.

## 2020-05-24 ENCOUNTER — Encounter: Payer: Self-pay | Admitting: Physician Assistant

## 2020-05-24 ENCOUNTER — Ambulatory Visit (INDEPENDENT_AMBULATORY_CARE_PROVIDER_SITE_OTHER): Payer: Managed Care, Other (non HMO) | Admitting: Physician Assistant

## 2020-05-24 ENCOUNTER — Other Ambulatory Visit: Payer: Self-pay

## 2020-05-24 VITALS — BP 150/88 | HR 75 | Temp 98.5°F | Resp 16 | Ht 68.0 in | Wt 254.0 lb

## 2020-05-24 DIAGNOSIS — Z0000 Encounter for general adult medical examination without abnormal findings: Secondary | ICD-10-CM | POA: Diagnosis not present

## 2020-05-24 DIAGNOSIS — I10 Essential (primary) hypertension: Secondary | ICD-10-CM

## 2020-05-24 DIAGNOSIS — Z23 Encounter for immunization: Secondary | ICD-10-CM

## 2020-05-24 DIAGNOSIS — M25561 Pain in right knee: Secondary | ICD-10-CM

## 2020-05-24 DIAGNOSIS — Z124 Encounter for screening for malignant neoplasm of cervix: Secondary | ICD-10-CM

## 2020-05-24 DIAGNOSIS — E66812 Obesity, class 2: Secondary | ICD-10-CM

## 2020-05-24 DIAGNOSIS — G479 Sleep disorder, unspecified: Secondary | ICD-10-CM

## 2020-05-24 DIAGNOSIS — E7800 Pure hypercholesterolemia, unspecified: Secondary | ICD-10-CM | POA: Diagnosis not present

## 2020-05-24 DIAGNOSIS — K921 Melena: Secondary | ICD-10-CM | POA: Diagnosis not present

## 2020-05-24 DIAGNOSIS — G8929 Other chronic pain: Secondary | ICD-10-CM

## 2020-05-24 DIAGNOSIS — M25562 Pain in left knee: Secondary | ICD-10-CM

## 2020-05-24 DIAGNOSIS — Z6838 Body mass index (BMI) 38.0-38.9, adult: Secondary | ICD-10-CM

## 2020-05-24 LAB — IFOBT (OCCULT BLOOD): IFOBT: NEGATIVE

## 2020-05-24 MED ORDER — GABAPENTIN 300 MG PO CAPS: 300.0000 mg | ORAL_CAPSULE | Freq: Every day | ORAL | 0 refills | Status: DC

## 2020-05-24 NOTE — Progress Notes (Signed)
Complete physical exam   Patient: Rita Foster   DOB: [birth year]   59 y.o. Female  MRN: [id number] Visit Date: 05/24/2020  Today's healthcare provider: Mar Daring, PA-C   Chief Complaint  Patient presents with  . Annual Exam  . Knee Pain  . Insomnia   Subjective    Rita Foster is a 59 y.o. female who presents today for a complete physical exam.  She reports consuming a general diet. The patient does not participate in regular exercise at present. She generally feels fairly well. She reports sleeping poorly. She does have additional problems to discuss today.   Patient c/o worsening knee pain, patient requesting a referral to rheumatology. Patient reports she was advised by Dr. Allen Norris to D/C NSAIDS. Patient reports she is using topical gel for pain. Patient would like to be checked for blood in her stools.   Patient c/o insomnia and increase fatigue. Patient reports she is in pain and feels like pain is causing insomnia. Patient reports taking Melatonin in the past, reports no help. Patient reports she does take care of her father and does not want to take strong medications. Patient reports she can sleep for several hours and still wakes up feeling not rested.    HPI  05/22/2019 Pap/HPV-Negative 02/11/2020 Mammogram-BI-RADS 1 08/29/2013 Colonoscopy-WNL  No past medical history on file. Past Surgical History:  Procedure Laterality Date  . BREAST BIOPSY Left 10+ years ago   Negative core  . BREAST SURGERY  2002   biopsy  . CATARACT EXTRACTION Right 07/2014  . HERNIA REPAIR  02/21/2010  . TONSILLECTOMY AND ADENOIDECTOMY  1968   Social History   Socioeconomic History  . Marital status: Divorced    Spouse name: Not on file  . Number of children: Not on file  . Years of education: Not on file  . Highest education level: Not on file  Occupational History  . Not on file  Tobacco Use  . Smoking status: Former Smoker    Years: 5.00    Quit date:  07/03/1999    Years since quitting: 20.9  . Smokeless tobacco: Never Used  Substance and Sexual Activity  . Alcohol use: No  . Drug use: No  . Sexual activity: Not on file  Other Topics Concern  . Not on file  Social History Narrative  . Not on file   Social Determinants of Health   Financial Resource Strain:   . Difficulty of Paying Living Expenses: Not on file  Food Insecurity:   . Worried About Charity fundraiser in the Last Year: Not on file  . Ran Out of Food in the Last Year: Not on file  Transportation Needs:   . Lack of Transportation (Medical): Not on file  . Lack of Transportation (Non-Medical): Not on file  Physical Activity:   . Days of Exercise per Week: Not on file  . Minutes of Exercise per Session: Not on file  Stress:   . Feeling of Stress : Not on file  Social Connections:   . Frequency of Communication with Friends and Family: Not on file  . Frequency of Social Gatherings with Friends and Family: Not on file  . Attends Religious Services: Not on file  . Active Member of Clubs or Organizations: Not on file  . Attends Archivist Meetings: Not on file  . Marital Status: Not on file  Intimate Partner Violence:   . Fear of Current or Ex-Partner: Not  on file  . Emotionally Abused: Not on file  . Physically Abused: Not on file  . Sexually Abused: Not on file   Family Status  Relation Name Status  . Mother  Alive  . Father  Alive  . MGM  Deceased at age 53       natural causes  . MGF  Deceased       artherosclerosis  . PGM  Deceased       old age  . PGF  Deceased       unknown causes   Family History  Problem Relation Age of Onset  . Crohn's disease Mother   . Thyroid disease Mother   . Rheum arthritis Mother   . Breast cancer Mother 36  . Hypertension Father   . Skin cancer Father    Allergies  Allergen Reactions  . Penicillins     Patient Care Team: Mar Daring, PA-C as PCP - General (Family Medicine)    Medications: Outpatient Medications Prior to Visit  Medication Sig  . acetaminophen (TYLENOL) 500 MG tablet Take 650 mg by mouth at bedtime as needed for mild pain or moderate pain.   Marland Kitchen acyclovir (ZOVIRAX) 400 MG tablet TAKE 1 TABLET BY MOUTH TWICE DAILY  . amLODipine (NORVASC) 10 MG tablet Take 1 tablet (10 mg total) by mouth daily.  . Biotin w/ Vitamins C & E (HAIR SKIN & NAILS GUMMIES PO) Take 1 tablet by mouth daily.  . Blood Pressure Monitoring (BLOOD PRESSURE MONITOR/WRIST) DEVI To check bp daily  . diclofenac Sodium (VOLTAREN) 1 % GEL Apply 2 g topically in the morning and at bedtime.  Marland Kitchen losartan (COZAAR) 100 MG tablet TAKE 1 TABLET(100 MG) BY MOUTH DAILY  . Misc Natural Products (OSTEO BI-FLEX JOINT SHIELD) TABS Take 2 tablets by mouth daily at 12 noon.   . Multiple Vitamin tablet Take 1 tablet by mouth daily.   . NON FORMULARY rawleigh medicated ointment  . OMEPRAZOLE PO Take 1 capsule by mouth daily.  . Probiotic Product (PROBIOTIC PO) Take by mouth.  . Turmeric 500 MG CAPS Take 1,000 mg by mouth daily at 12 noon.  Marland Kitchen Dexlansoprazole (DEXILANT PO) Take 1 tablet by mouth daily. (Patient not taking: Reported on 05/24/2020)   No facility-administered medications prior to visit.    Review of Systems  Constitutional: Positive for fatigue.  HENT: Positive for tinnitus.   Eyes: Positive for photophobia.  Respiratory: Negative.   Cardiovascular: Negative.   Gastrointestinal: Positive for abdominal pain.  Endocrine: Negative.   Genitourinary: Negative.   Musculoskeletal: Positive for arthralgias.  Skin: Negative.   Allergic/Immunologic: Negative.   Neurological: Negative.   Hematological: Negative.   Psychiatric/Behavioral: Negative.     Last CBC Lab Results  Component Value Date   WBC 11.6 (H) 01/29/2020   HGB 12.7 01/29/2020   HCT 38.3 01/29/2020   MCV 93 01/29/2020   MCH 30.8 01/29/2020   RDW 13.0 01/29/2020   PLT 296 25/36/6440   Last metabolic panel Lab  Results  Component Value Date   GLUCOSE 93 01/29/2020   NA 140 01/29/2020   K 4.3 01/29/2020   CL 103 01/29/2020   CO2 22 01/29/2020   BUN 17 01/29/2020   CREATININE 0.69 01/29/2020   GFRNONAA 96 01/29/2020   GFRAA 110 01/29/2020   CALCIUM 9.2 01/29/2020   PROT 6.6 01/29/2020   ALBUMIN 4.2 01/29/2020   LABGLOB 2.4 01/29/2020   AGRATIO 1.8 01/29/2020   BILITOT 0.4 01/29/2020   ALKPHOS  72 01/29/2020   AST 17 01/29/2020   ALT 16 01/29/2020   ANIONGAP 6 03/28/2019   Last lipids Lab Results  Component Value Date   CHOL 216 (H) 01/29/2020   HDL 52 01/29/2020   LDLCALC 142 (H) 01/29/2020   TRIG 122 01/29/2020   Last hemoglobin A1c Lab Results  Component Value Date   HGBA1C 5.6 01/29/2020   Last thyroid functions Lab Results  Component Value Date   TSH 1.060 01/29/2020     Objective    BP (!) 150/88 (BP Location: Left Arm, Patient Position: Sitting, Cuff Size: Large)   Pulse 75   Temp 98.5 F (36.9 C) (Oral)   Resp 16   Ht 5\' 8"  (1.727 m)   Wt 254 lb (115.2 kg)   SpO2 99%   BMI 38.62 kg/m  BP Readings from Last 3 Encounters:  05/24/20 (!) 150/88  05/20/20 (!) 147/92  01/29/20 (!) 133/84   Wt Readings from Last 3 Encounters:  05/24/20 254 lb (115.2 kg)  05/20/20 255 lb 3.2 oz (115.8 kg)  01/29/20 (!) 248 lb 12.8 oz (112.9 kg)      Physical Exam Vitals reviewed.  Constitutional:      General: She is not in acute distress.    Appearance: Normal appearance. She is well-developed. She is obese. She is not ill-appearing or diaphoretic.  HENT:     Head: Normocephalic and atraumatic.     Right Ear: Hearing, tympanic membrane, ear canal and external ear normal.     Left Ear: Hearing, tympanic membrane, ear canal and external ear normal.     Nose: Nose normal.     Mouth/Throat:     Mouth: Mucous membranes are moist.     Pharynx: Oropharynx is clear. Uvula midline. No oropharyngeal exudate or posterior oropharyngeal erythema.  Eyes:     General: No scleral  icterus.       Right eye: No discharge.        Left eye: No discharge.     Extraocular Movements: Extraocular movements intact.     Conjunctiva/sclera: Conjunctivae normal.     Pupils: Pupils are equal, round, and reactive to light.  Neck:     Thyroid: No thyromegaly.     Vascular: No carotid bruit or JVD.     Trachea: No tracheal deviation.  Cardiovascular:     Rate and Rhythm: Normal rate and regular rhythm.     Pulses: Normal pulses.     Heart sounds: Normal heart sounds. No murmur heard.  No friction rub. No gallop.   Pulmonary:     Effort: Pulmonary effort is normal. No respiratory distress.     Breath sounds: Normal breath sounds. No wheezing or rales.  Chest:     Chest wall: No tenderness.     Breasts: Breasts are symmetrical.        Right: No inverted nipple, mass, nipple discharge, skin change or tenderness.        Left: No inverted nipple, mass, nipple discharge, skin change or tenderness.  Abdominal:     General: Abdomen is flat. Bowel sounds are normal. There is no distension.     Palpations: Abdomen is soft. There is no mass.     Tenderness: There is no abdominal tenderness. There is no guarding or rebound.     Hernia: There is no hernia in the left inguinal area.  Genitourinary:    General: Normal vulva.     Exam position: Supine.     Labia:  Right: No rash, tenderness, lesion or injury.        Left: No rash, tenderness, lesion or injury.      Vagina: Normal. No signs of injury. No vaginal discharge, erythema, tenderness or bleeding.     Cervix: No cervical motion tenderness, discharge or friability.     Adnexa:        Right: No mass, tenderness or fullness.         Left: No mass, tenderness or fullness.       Rectum: Normal. Guaiac result negative.  Musculoskeletal:     Cervical back: Normal range of motion and neck supple. No tenderness.     Right knee: Swelling and bony tenderness present. Decreased range of motion. Tenderness present over the medial  joint line and lateral joint line. Normal pulse.     Left knee: Swelling and bony tenderness present. Decreased range of motion. Tenderness present over the medial joint line and lateral joint line. Normal pulse.  Lymphadenopathy:     Cervical: No cervical adenopathy.  Skin:    General: Skin is warm and dry.     Capillary Refill: Capillary refill takes less than 2 seconds.     Findings: No rash.  Neurological:     General: No focal deficit present.     Mental Status: She is alert and oriented to person, place, and time. Mental status is at baseline.     Cranial Nerves: No cranial nerve deficit.     Coordination: Coordination normal.     Deep Tendon Reflexes: Reflexes are normal and symmetric.  Psychiatric:        Mood and Affect: Mood normal.        Behavior: Behavior normal.        Thought Content: Thought content normal.        Judgment: Judgment normal.       Last depression screening scores PHQ 2/9 Scores 05/24/2020 05/22/2019 02/06/2018  PHQ - 2 Score 0 0 0  PHQ- 9 Score 6 1 5   Exception Documentation - - -   Last fall risk screening Fall Risk  05/24/2020  Falls in the past year? 0  Number falls in past yr: 0  Injury with Fall? 0  Risk for fall due to : No Fall Risks  Follow up Falls evaluation completed   Last Audit-C alcohol use screening Alcohol Use Disorder Test (AUDIT) 05/24/2020  1. How often do you have a drink containing alcohol? 0  2. How many drinks containing alcohol do you have on a typical day when you are drinking? 0  3. How often do you have six or more drinks on one occasion? 0  AUDIT-C Score 0  4. How often during the last year have you found that you were not able to stop drinking once you had started? -  5. How often during the last year have you failed to do what was normally expected from you because of drinking? -  6. How often during the last year have you needed a first drink in the morning to get yourself going after a heavy drinking session? -   7. How often during the last year have you had a feeling of guilt of remorse after drinking? -  8. How often during the last year have you been unable to remember what happened the night before because you had been drinking? -  9. Have you or someone else been injured as a result of your drinking? -  10. Has  a relative or friend or a doctor or another health worker been concerned about your drinking or suggested you cut down? -  Alcohol Use Disorder Identification Test Final Score (AUDIT) -  Alcohol Brief Interventions/Follow-up AUDIT Score <7 follow-up not indicated   A score of 3 or more in women, and 4 or more in men indicates increased risk for alcohol abuse, EXCEPT if all of the points are from question 1   No results found for any visits on 05/24/20.  Assessment & Plan    Routine Health Maintenance and Physical Exam  Exercise Activities and Dietary recommendations Goals   None     Immunization History  Administered Date(s) Administered  . Influenza,inj,Quad PF,6+ Mos 03/27/2019  . Pneumococcal Polysaccharide-23 05/03/2005  . Td 08/04/2002  . Tdap 01/27/2010    Health Maintenance  Topic Date Due  . COVID-19 Vaccine (1) Never done  . TETANUS/TDAP  01/28/2020  . INFLUENZA VACCINE  02/01/2020  . MAMMOGRAM  02/10/2021  . PAP SMEAR-Modifier  05/21/2022  . COLONOSCOPY  08/30/2023  . Hepatitis C Screening  Completed  . HIV Screening  Completed    Discussed health benefits of physical activity, and encouraged her to engage in regular exercise appropriate for her age and condition.  1. Annual physical exam Normal physical exam today. Will check labs as below and f/u pending lab results. If labs are stable and WNL she will not need to have these rechecked for one year at her next annual physical exam. She is to call the office in the meantime if she has any acute issue, questions or concerns. - Hemoglobin A1c - CBC with Differential/Platelet - Comprehensive metabolic  panel - Lipid Panel With LDL/HDL Ratio - TSH  2. Essential hypertension Stable. Continue Amlodipine 10mg , losartan 100mg . Will check labs as below and f/u pending results. - Comprehensive metabolic panel  3. Hypercholesteremia Stable. Diet controlled. Will check labs as below and f/u pending results. - Lipid Panel With LDL/HDL Ratio  4. Cervical cancer screening Pap collected today. Will send as below and f/u pending results. - IGP, Aptima HPV, rfx 16/18,45  5. Class 3 severe obesity due to excess calories with serious comorbidity and body mass index (BMI) of 38.0 to 38.9 in adult [facility]) Counseled patient on healthy lifestyle modifications including dieting and exercise. Discussed water therapy and non weight bearing exercises due to knee pain.  6. Need for Td vaccine Td booster Vaccine given to patient without complications. Patient sat for 15 minutes after administration and was tolerated well without adverse effects. - Td vaccine greater than or equal to 7yo preservative free IM  7. Blood in stool Negative. - IFOBT POC (occult bld, rslt in office)  8. Chronic pain of both knees Will add gabapentin as below at bedtime to see if this will help her sleep a little better. Knee pain is main reason she is not sleeping well. Also discussed her calling her orthopedic doctor and getting the gel shots to see if they will offer more relief for her.  F/U in 4-6 weeks.  - gabapentin (NEURONTIN) 300 MG capsule; Take 1 capsule (300 mg total) by mouth at bedtime.  Dispense: 90 capsule; Refill: 0  9. Difficulty sleeping See above medical treatment plan. - gabapentin (NEURONTIN) 300 MG capsule; Take 1 capsule (300 mg total) by mouth at bedtime.  Dispense: 90 capsule; Refill: 0    No follow-ups on file.     Reynolds Bowl, PA-C, have reviewed all documentation for this  visit. The documentation on 05/26/20 for the exam, diagnosis, procedures, and orders are all accurate and  complete.   Rubye Beach  [facility] [phone] (phone) [phone] (fax)  [place]

## 2020-05-24 NOTE — Patient Instructions (Addendum)
Gabapentin capsules or tablets What is this medicine? GABAPENTIN (GA ba pen tin) is used to control seizures in certain types of epilepsy. It is also used to treat certain types of nerve pain. This medicine may be used for other purposes; ask your health care provider or pharmacist if you have questions. COMMON BRAND NAME(S): Active-PAC with Gabapentin, Gabarone, Neurontin What should I tell my health care provider before I take this medicine? They need to know if you have any of these conditions:  history of drug abuse or alcohol abuse problem  kidney disease  lung or breathing disease  suicidal thoughts, plans, or attempt; a previous suicide attempt by you or a family member  an unusual or allergic reaction to gabapentin, other medicines, foods, dyes, or preservatives  pregnant or trying to get pregnant  breast-feeding How should I use this medicine? Take this medicine by mouth with a glass of water. Follow the directions on the prescription label. You can take it with or without food. If it upsets your stomach, take it with food. Take your medicine at regular intervals. Do not take it more often than directed. Do not stop taking except on your doctor's advice. If you are directed to break the 600 or 800 mg tablets in half as part of your dose, the extra half tablet should be used for the next dose. If you have not used the extra half tablet within 28 days, it should be thrown away. A special MedGuide will be given to you by the pharmacist with each prescription and refill. Be sure to read this information carefully each time. Talk to your pediatrician regarding the use of this medicine in children. While this drug may be prescribed for children as young as 3 years for selected conditions, precautions do apply. Overdosage: If you think you have taken too much of this medicine contact a poison control center or emergency room at once. NOTE: This medicine is only for you. Do not share this  medicine with others. What if I miss a dose? If you miss a dose, take it as soon as you can. If it is almost time for your next dose, take only that dose. Do not take double or extra doses. What may interact with this medicine? This medicine may interact with the following medications:  alcohol  antihistamines for allergy, cough, and cold  certain medicines for anxiety or sleep  certain medicines for depression like amitriptyline, fluoxetine, sertraline  certain medicines for seizures like phenobarbital, primidone  certain medicines for stomach problems  general anesthetics like halothane, isoflurane, methoxyflurane, propofol  local anesthetics like lidocaine, pramoxine, tetracaine  medicines that relax muscles for surgery  narcotic medicines for pain  phenothiazines like chlorpromazine, mesoridazine, prochlorperazine, thioridazine This list may not describe all possible interactions. Give your health care provider a list of all the medicines, herbs, non-prescription drugs, or dietary supplements you use. Also tell them if you smoke, drink alcohol, or use illegal drugs. Some items may interact with your medicine. What should I watch for while using this medicine? Visit your doctor or health care provider for regular checks on your progress. You may want to keep a record at home of how you feel your condition is responding to treatment. You may want to share this information with your doctor or health care provider at each visit. You should contact your doctor or health care provider if your seizures get worse or if you have any new types of seizures. Do not stop taking   this medicine or any of your seizure medicines unless instructed by your doctor or health care provider. Stopping your medicine suddenly can increase your seizures or their severity. This medicine may cause serious skin reactions. They can happen weeks to months after starting the medicine. Contact your health care  provider right away if you notice fevers or flu-like symptoms with a rash. The rash may be red or purple and then turn into blisters or peeling of the skin. Or, you might notice a red rash with swelling of the face, lips or lymph nodes in your neck or under your arms. Wear a medical identification bracelet or chain if you are taking this medicine for seizures, and carry a card that lists all your medications. You may get drowsy, dizzy, or have blurred vision. Do not drive, use machinery, or do anything that needs mental alertness until you know how this medicine affects you. To reduce dizzy or fainting spells, do not sit or stand up quickly, especially if you are an older patient. Alcohol can increase drowsiness and dizziness. Avoid alcoholic drinks. Your mouth may get dry. Chewing sugarless gum or sucking hard candy, and drinking plenty of water will help. The use of this medicine may increase the chance of suicidal thoughts or actions. Pay special attention to how you are responding while on this medicine. Any worsening of mood, or thoughts of suicide or dying should be reported to your health care provider right away. Women who become pregnant while using this medicine may enroll in the [place] Pregnancy Registry by calling [phone]. This registry collects information about the safety of antiepileptic drug use during pregnancy. What side effects may I notice from receiving this medicine? Side effects that you should report to your doctor or health care professional as soon as possible:  allergic reactions like skin rash, itching or hives, swelling of the face, lips, or tongue  breathing problems  rash, fever, and swollen lymph nodes  redness, blistering, peeling or loosening of the skin, including inside the mouth  suicidal thoughts, mood changes Side effects that usually do not require medical attention (report to your doctor or health care professional if they  continue or are bothersome):  dizziness  drowsiness  headache  nausea, vomiting  swelling of ankles, feet, hands  tiredness This list may not describe all possible side effects. Call your doctor for medical advice about side effects. You may report side effects to FDA at 1-800-FDA-1088. Where should I keep my medicine? Keep out of reach of children. This medicine may cause accidental overdose and death if it taken by other adults, children, or pets. Mix any unused medicine with a substance like cat litter or coffee grounds. Then throw the medicine away in a sealed container like a sealed bag or a coffee can with a lid. Do not use the medicine after the expiration date. Store at room temperature between 15 and 30 degrees C (59 and 86 degrees F). NOTE: This sheet is a summary. It may not cover all possible information. If you have questions about this medicine, talk to your doctor, pharmacist, or health care provider.  2020 Elsevier/Gold Standard (2018-09-20 14:16:43)   Td (Tetanus, Diphtheria) Vaccine: What You Need to Know 1. Why get vaccinated? Td vaccine can prevent tetanus and diphtheria. Tetanus enters the body through cuts or wounds. Diphtheria spreads from person to person.  TETANUS (T) causes painful stiffening of the muscles. Tetanus can lead to serious health problems, including being unable to  open the mouth, having trouble swallowing and breathing, or death.  DIPHTHERIA (D) can lead to difficulty breathing, heart failure, paralysis, or death. 2. Td vaccine Td is only for children 7 years and older, adolescents, and adults.  Td is usually given as a booster dose every 10 years, but it can also be given earlier after a severe and dirty wound or burn. Another vaccine, called Tdap, that protects against pertussis, also known as "whooping cough," in addition to tetanus and diphtheria, may be used instead of Td.  Td may be given at the same time as other vaccines. 3. Talk  with your health care provider Tell your vaccine provider if the person getting the vaccine:  Has had an allergic reaction after a previous dose of any vaccine that protects against tetanus or diphtheria, or has any severe, life-threatening allergies.  Has ever had Guillain-Barr Syndrome (also called GBS).  Has had severe pain or swelling after a previous dose of any vaccine that protects against tetanus or diphtheria. In some cases, your health care provider may decide to postpone Td vaccination to a future visit.  People with minor illnesses, such as a cold, may be vaccinated. People who are moderately or severely ill should usually wait until they recover before getting Td vaccine.  Your health care provider can give you more information. 4. Risks of a vaccine reaction  Pain, redness, or swelling where the shot was given, mild fever, headache, feeling tired, and nausea, vomiting, diarrhea, or stomachache sometimes happen after Td vaccine. People sometimes faint after medical procedures, including vaccination. Tell your provider if you feel dizzy or have vision changes or ringing in the ears.  As with any medicine, there is a very remote chance of a vaccine causing a severe allergic reaction, other serious injury, or death. 5. What if there is a serious problem? An allergic reaction could occur after the vaccinated person leaves the clinic. If you see signs of a severe allergic reaction (hives, swelling of the face and throat, difficulty breathing, a fast heartbeat, dizziness, or weakness), call 9-1-1 and get the person to the nearest hospital.  For other signs that concern you, call your health care provider.  Adverse reactions should be reported to the Vaccine Adverse Event Reporting System (VAERS). Your health care provider will usually file this report, or you can do it yourself. Visit the VAERS website at www.vaers.SamedayNews.es or call (907) 206-8824. VAERS is only for reporting reactions, and  VAERS staff do not give medical advice. 6. The National Vaccine Injury Compensation Program The [employer] Vaccine Injury Compensation Program (VICP) is a federal program that was created to compensate people who may have been injured by certain vaccines. Visit the VICP website at GoldCloset.com.ee or call 772-137-4602 to learn about the program and about filing a claim. There is a time limit to file a claim for compensation. 7. How can I learn more?  Ask your health care provider.  Call your local or state health department.  Contact the Centers for Disease Control and Prevention (CDC): ? Call (732)180-9412 (1-800-CDC-INFO) or ? Visit CDC's website at http://hunter.com/ Vaccine Information Statement Td Vaccine (10/02/18) This information is not intended to replace advice given to you by your health care provider. Make sure you discuss any questions you have with your health care provider. Document Revised: 11/11/2018 Document Reviewed: 10/14/2018 Elsevier Patient Education  2020 Elsevier Inc.   Preventive Care 78-62 Years Old, Female Preventive care refers to visits with your health care provider and lifestyle  choices that can promote health and wellness. This includes:  A yearly physical exam. This may also be called an annual well check.  Regular dental visits and eye exams.  Immunizations.  Screening for certain conditions.  Healthy lifestyle choices, such as eating a healthy diet, getting regular exercise, not using drugs or products that contain nicotine and tobacco, and limiting alcohol use. What can I expect for my preventive care visit? Physical exam Your health care provider will check your:  Height and weight. This may be used to calculate body mass index (BMI), which tells if you are at a healthy weight.  Heart rate and blood pressure.  Skin for abnormal spots. Counseling Your health care provider may ask you questions about your:  Alcohol,  tobacco, and drug use.  Emotional well-being.  Home and relationship well-being.  Sexual activity.  Eating habits.  Work and work Statistician.  Method of birth control.  Menstrual cycle.  Pregnancy history. What immunizations do I need?  Influenza (flu) vaccine  This is recommended every year. Tetanus, diphtheria, and pertussis (Tdap) vaccine  You may need a Td booster every 10 years. Varicella (chickenpox) vaccine  You may need this if you have not been vaccinated. Zoster (shingles) vaccine  You may need this after age 80. Measles, mumps, and rubella (MMR) vaccine  You may need at least one dose of MMR if you were born in 1957 or later. You may also need a second dose. Pneumococcal conjugate (PCV13) vaccine  You may need this if you have certain conditions and were not previously vaccinated. Pneumococcal polysaccharide (PPSV23) vaccine  You may need one or two doses if you smoke cigarettes or if you have certain conditions. Meningococcal conjugate (MenACWY) vaccine  You may need this if you have certain conditions. Hepatitis A vaccine  You may need this if you have certain conditions or if you travel or work in places where you may be exposed to hepatitis A. Hepatitis B vaccine  You may need this if you have certain conditions or if you travel or work in places where you may be exposed to hepatitis B. Haemophilus influenzae type b (Hib) vaccine  You may need this if you have certain conditions. Human papillomavirus (HPV) vaccine  If recommended by your health care provider, you may need three doses over 6 months. You may receive vaccines as individual doses or as more than one vaccine together in one shot (combination vaccines). Talk with your health care provider about the risks and benefits of combination vaccines. What tests do I need? Blood tests  Lipid and cholesterol levels. These may be checked every 5 years, or more frequently if you are over 54  years old.  Hepatitis C test.  Hepatitis B test. Screening  Lung cancer screening. You may have this screening every year starting at age 68 if you have a 30-pack-year history of smoking and currently smoke or have quit within the past 15 years.  Colorectal cancer screening. All adults should have this screening starting at age 80 and continuing until age 91. Your health care provider may recommend screening at age 75 if you are at increased risk. You will have tests every 1-10 years, depending on your results and the type of screening test.  Diabetes screening. This is done by checking your blood sugar (glucose) after you have not eaten for a while (fasting). You may have this done every 1-3 years.  Mammogram. This may be done every 1-2 years. Talk with your health  care provider about when you should start having regular mammograms. This may depend on whether you have a family history of breast cancer.  BRCA-related cancer screening. This may be done if you have a family history of breast, ovarian, tubal, or peritoneal cancers.  Pelvic exam and Pap test. This may be done every 3 years starting at age 15. Starting at age 31, this may be done every 5 years if you have a Pap test in combination with an HPV test. Other tests  Sexually transmitted disease (STD) testing.  Bone density scan. This is done to screen for osteoporosis. You may have this scan if you are at high risk for osteoporosis. Follow these instructions at home: Eating and drinking  Eat a diet that includes fresh fruits and vegetables, whole grains, lean protein, and low-fat dairy.  Take vitamin and mineral supplements as recommended by your health care provider.  Do not drink alcohol if: ? Your health care provider tells you not to drink. ? You are pregnant, may be pregnant, or are planning to become pregnant.  If you drink alcohol: ? Limit how much you have to 0-1 drink a day. ? Be aware of how much alcohol is in  your drink. In the [place] one drink equals one 12 oz bottle of beer (355 mL), one 5 oz glass of wine (148 mL), or one 1 oz glass of hard liquor (44 mL). Lifestyle  Take daily care of your teeth and gums.  Stay active. Exercise for at least 30 minutes on 5 or more days each week.  Do not use any products that contain nicotine or tobacco, such as cigarettes, e-cigarettes, and chewing tobacco. If you need help quitting, ask your health care provider.  If you are sexually active, practice safe sex. Use a condom or other form of birth control (contraception) in order to prevent pregnancy and STIs (sexually transmitted infections).  If told by your health care provider, take low-dose aspirin daily starting at age 26. What's next?  Visit your health care provider once a year for a well check visit.  Ask your health care provider how often you should have your eyes and teeth checked.  Stay up to date on all vaccines. This information is not intended to replace advice given to you by your health care provider. Make sure you discuss any questions you have with your health care provider. Document Revised: 02/28/2018 Document Reviewed: 02/28/2018 Elsevier Patient Education  2020 [employer].

## 2020-05-27 LAB — IGP, APTIMA HPV, RFX 16/18,45
HPV Aptima: NEGATIVE
PAP Smear Comment: 0

## 2020-05-31 ENCOUNTER — Telehealth: Payer: Self-pay

## 2020-05-31 NOTE — Telephone Encounter (Signed)
Pt advised.   Thanks,   -Emauri Krygier  

## 2020-05-31 NOTE — Telephone Encounter (Signed)
-----   Message from Mar Daring, PA-C sent at 05/31/2020  7:22 AM EST ----- Pap is normal, HPV negative.  Will repeat in 3-5 years.

## 2020-06-17 ENCOUNTER — Telehealth: Payer: Self-pay

## 2020-06-17 NOTE — Telephone Encounter (Signed)
Please review.  Is there anything else she can try.

## 2020-06-17 NOTE — Telephone Encounter (Signed)
Copied from Jack [id number]. Topic: Quick Communication - See Telephone Encounter >> Jun 17, 2020  3:14 PM Loma Boston wrote: CRM for notification. See Telephone encounter for: 06/17/20. Pt was given gabapentin (NEURONTIN) 300 MG capsule 90 capsule 0 05/24/2020   Sig - Route: Take 1 capsule (300 mg total) by mouth at bedtime. - Oral  Sent to pharmacy as: gabapentin (NEURONTIN) 300 MG capsule   This for Knee pain and to relax to sleep. It is not working really at  all, moving legs a bit less but side effects of very dizzy, pt has stop completely taking. Also she had from another dr a while back give her MECLIZINE 12.5 mg for dizziness and script was for one three times a day, she is taking that today as she had to take father to appts. Wants FU call nurse on where to go from this point   CB [phone]

## 2020-06-18 NOTE — Telephone Encounter (Addendum)
Pt called in to follow up on previously message sent. Pt says that she didn't take medication last night because of the side effects. Pt would like to make sure that she is okay by doing that?   Pt also says that she has an apt to start getting injections on Monday. Pt cancelled apt for next week, stating that she feels that she no longer need due to not continuing medication.      CB: [phone]-- please advise pt further.

## 2020-06-18 NOTE — Addendum Note (Signed)
Addended by: Mar Daring on: 06/18/2020 11:01 AM   Modules accepted: Orders

## 2020-06-18 NOTE — Telephone Encounter (Signed)
Patient advised as directed below. 

## 2020-06-18 NOTE — Telephone Encounter (Signed)
Please review. Thanks!  

## 2020-06-18 NOTE — Telephone Encounter (Signed)
Yes she can just stop the medication. Since she is having injections next week, she can just wait and see what orthopedics says and hopefully the injections will help as well.

## 2020-06-23 ENCOUNTER — Ambulatory Visit: Payer: Self-pay | Admitting: Physician Assistant

## 2020-07-02 ENCOUNTER — Other Ambulatory Visit: Payer: Self-pay | Admitting: Physician Assistant

## 2020-07-02 DIAGNOSIS — I10 Essential (primary) hypertension: Secondary | ICD-10-CM

## 2020-07-14 ENCOUNTER — Encounter: Payer: Managed Care, Other (non HMO) | Admitting: Dermatology

## 2020-08-02 ENCOUNTER — Encounter: Payer: Managed Care, Other (non HMO) | Admitting: Dermatology

## 2020-08-19 ENCOUNTER — Other Ambulatory Visit: Payer: Self-pay | Admitting: Physician Assistant

## 2020-08-19 DIAGNOSIS — G479 Sleep disorder, unspecified: Secondary | ICD-10-CM

## 2020-08-19 DIAGNOSIS — G8929 Other chronic pain: Secondary | ICD-10-CM

## 2020-09-14 ENCOUNTER — Ambulatory Visit: Payer: Managed Care, Other (non HMO) | Admitting: Physician Assistant

## 2020-09-15 ENCOUNTER — Other Ambulatory Visit: Payer: Self-pay

## 2020-09-15 ENCOUNTER — Ambulatory Visit: Payer: Managed Care, Other (non HMO) | Admitting: Physician Assistant

## 2020-09-15 VITALS — BP 146/99 | HR 74 | Temp 98.8°F | Wt 265.0 lb

## 2020-09-15 DIAGNOSIS — G479 Sleep disorder, unspecified: Secondary | ICD-10-CM | POA: Diagnosis not present

## 2020-09-15 DIAGNOSIS — Z1273 Encounter for screening for malignant neoplasm of ovary: Secondary | ICD-10-CM | POA: Diagnosis not present

## 2020-09-15 MED ORDER — TRAZODONE HCL 50 MG PO TABS: 25.0000 mg | ORAL_TABLET | Freq: Every evening | ORAL | 3 refills | Status: DC | PRN

## 2020-09-15 NOTE — Patient Instructions (Signed)
Trazodone Tablets What is this medicine? TRAZODONE (TRAZ oh done) is used to treat depression. This medicine may be used for other purposes; ask your health care provider or pharmacist if you have questions. COMMON BRAND NAME(S): Desyrel What should I tell my health care provider before I take this medicine? They need to know if you have any of these conditions:  attempted suicide or thinking about it  bipolar disorder  bleeding problems  glaucoma  heart disease, or previous heart attack  irregular heart beat  kidney or liver disease  low levels of sodium in the blood  an unusual or allergic reaction to trazodone, other medicines, foods, dyes or preservatives  pregnant or trying to get pregnant  breast-feeding How should I use this medicine? Take this medicine by mouth with a glass of water. Follow the directions on the prescription label. Take this medicine shortly after a meal or a light snack. Take your medicine at regular intervals. Do not take your medicine more often than directed. Do not stop taking this medicine suddenly except upon the advice of your doctor. Stopping this medicine too quickly may cause serious side effects or your condition may worsen. A special MedGuide will be given to you by the pharmacist with each prescription and refill. Be sure to read this information carefully each time. Talk to your pediatrician regarding the use of this medicine in children. Special care may be needed. Overdosage: If you think you have taken too much of this medicine contact a poison control center or emergency room at once. NOTE: This medicine is only for you. Do not share this medicine with others. What if I miss a dose? If you miss a dose, take it as soon as you can. If it is almost time for your next dose, take only that dose. Do not take double or extra doses. What may interact with this medicine? Do not take this medicine with any of the following  medications:  certain medicines for fungal infections like fluconazole, itraconazole, ketoconazole, posaconazole, voriconazole  cisapride  dronedarone  linezolid  MAOIs like Carbex, Eldepryl, Marplan, Nardil, and Parnate  mesoridazine  methylene blue (injected into a vein)  pimozide  saquinavir  thioridazine This medicine may also interact with the following medications:  alcohol  antiviral medicines for HIV or AIDS  aspirin and aspirin-like medicines  barbiturates like phenobarbital  certain medicines for blood pressure, heart disease, irregular heart beat  certain medicines for depression, anxiety, or psychotic disturbances  certain medicines for migraine headache like almotriptan, eletriptan, frovatriptan, naratriptan, rizatriptan, sumatriptan, zolmitriptan  certain medicines for seizures like carbamazepine and phenytoin  certain medicines for sleep  certain medicines that treat or prevent blood clots like dalteparin, enoxaparin, warfarin  digoxin  fentanyl  lithium  NSAIDS, medicines for pain and inflammation, like ibuprofen or naproxen  other medicines that prolong the QT interval (cause an abnormal heart rhythm) like dofetilide  rasagiline  supplements like St. John's wort, kava kava, valerian  tramadol  tryptophan This list may not describe all possible interactions. Give your health care provider a list of all the medicines, herbs, non-prescription drugs, or dietary supplements you use. Also tell them if you smoke, drink alcohol, or use illegal drugs. Some items may interact with your medicine. What should I watch for while using this medicine? Tell your doctor if your symptoms do not get better or if they get worse. Visit your doctor or health care professional for regular checks on your progress. Because it may take   several weeks to see the full effects of this medicine, it is important to continue your treatment as prescribed by your  doctor. Patients and their families should watch out for new or worsening thoughts of suicide or depression. Also watch out for sudden changes in feelings such as feeling anxious, agitated, panicky, irritable, hostile, aggressive, impulsive, severely restless, overly excited and hyperactive, or not being able to sleep. If this happens, especially at the beginning of treatment or after a change in dose, call your health care professional. You may get drowsy or dizzy. Do not drive, use machinery, or do anything that needs mental alertness until you know how this medicine affects you. Do not stand or sit up quickly, especially if you are an older patient. This reduces the risk of dizzy or fainting spells. Alcohol may interfere with the effect of this medicine. Avoid alcoholic drinks. This medicine may cause dry eyes and blurred vision. If you wear contact lenses you may feel some discomfort. Lubricating drops may help. See your eye doctor if the problem does not go away or is severe. Your mouth may get dry. Chewing sugarless gum, sucking hard candy and drinking plenty of water may help. Contact your doctor if the problem does not go away or is severe. What side effects may I notice from receiving this medicine? Side effects that you should report to your doctor or health care professional as soon as possible:  allergic reactions like skin rash, itching or hives, swelling of the face, lips, or tongue  elevated mood, decreased need for sleep, racing thoughts, impulsive behavior  confusion  fast, irregular heartbeat  feeling faint or lightheaded, falls  feeling agitated, angry, or irritable  loss of balance or coordination  painful or prolonged erections  restlessness, pacing, inability to keep still  suicidal thoughts or other mood changes  tremors  trouble sleeping  seizures  unusual bleeding or bruising Side effects that usually do not require medical attention (report to your doctor  or health care professional if they continue or are bothersome):  change in sex drive or performance  change in appetite or weight  constipation  headache  muscle aches or pains  nausea This list may not describe all possible side effects. Call your doctor for medical advice about side effects. You may report side effects to FDA at 1-800-FDA-1088. Where should I keep my medicine? Keep out of the reach of children. Store at room temperature between 15 and 30 degrees C (59 to 86 degrees F). Protect from light. Keep container tightly closed. Throw away any unused medicine after the expiration date. NOTE: This sheet is a summary. It may not cover all possible information. If you have questions about this medicine, talk to your doctor, pharmacist, or health care provider.  2021 Elsevier/Gold Standard (2020-05-10 14:46:11)  

## 2020-09-15 NOTE — Progress Notes (Signed)
Established patient visit   Patient: Rita Foster   DOB: January 07, 1961   60 y.o. Female  MRN: [id number] Visit Date: 09/15/2020  Today's healthcare provider: Mar Daring, PA-C   No chief complaint on file.  Subjective    HPI  Hypertension, follow-up  BP Readings from Last 3 Encounters:  09/15/20 (!) 146/99  05/24/20 (!) 150/88  05/20/20 (!) 147/92   Wt Readings from Last 3 Encounters:  09/15/20 265 lb (120.2 kg)  05/24/20 254 lb (115.2 kg)  05/20/20 255 lb 3.2 oz (115.8 kg)     She was last seen for hypertension 4 months ago.  BP at that visit was as above. Management since that visit includes none.  She reports good compliance with treatment. She is not having side effects. none  Symptoms: No chest pain No chest pressure  No palpitations Yes syncope  No dyspnea No orthopnea  No paroxysmal nocturnal dyspnea No lower extremity edema   Pertinent labs: Lab Results  Component Value Date   CHOL 216 (H) 01/29/2020   HDL 52 01/29/2020   LDLCALC 142 (H) 01/29/2020   TRIG 122 01/29/2020   Lab Results  Component Value Date   NA 140 01/29/2020   K 4.3 01/29/2020   CREATININE 0.69 01/29/2020   GFRNONAA 96 01/29/2020   GFRAA 110 01/29/2020   GLUCOSE 93 01/29/2020     The 10-year ASCVD risk score (Goff DC Jr., et al., 2013) is: 6.3%   ---------------------------------------------------------------------------------------------------  Patient also complained of being fatigued.  She states she works full time and cares for her dad.  She reports having great difficulty getting enough sleep.  She would like to be evaluated and given some direction in treatment for this.   She also states she would like to have testing done due to history of prolonged use of talcum powder and concern for cervical cancer.  She did have pap done at CPE last visit.   Patient did not have labs drawn that were ordered last visit.    Medications: Outpatient Medications Prior  to Visit  Medication Sig  . acetaminophen (TYLENOL) 500 MG tablet Take 650 mg by mouth at bedtime as needed for mild pain or moderate pain.   Marland Kitchen acyclovir (ZOVIRAX) 400 MG tablet TAKE 1 TABLET BY MOUTH TWICE DAILY  . amLODipine (NORVASC) 10 MG tablet Take 1 tablet (10 mg total) by mouth daily.  . Biotin w/ Vitamins C & E (HAIR SKIN & NAILS GUMMIES PO) Take 1 tablet by mouth daily.  . Blood Pressure Monitoring (BLOOD PRESSURE MONITOR/WRIST) DEVI To check bp daily  . diclofenac Sodium (VOLTAREN) 1 % GEL Apply 2 g topically in the morning and at bedtime.  Marland Kitchen losartan (COZAAR) 100 MG tablet TAKE 1 TABLET(100 MG) BY MOUTH DAILY  . Misc Natural Products (OSTEO BI-FLEX JOINT SHIELD) TABS Take 2 tablets by mouth daily at 12 noon.   . Multiple Vitamin tablet Take 1 tablet by mouth daily.   . NON FORMULARY rawleigh medicated ointment  . OMEPRAZOLE PO Take 1 capsule by mouth daily.  . Probiotic Product (PROBIOTIC PO) Take by mouth.  . Turmeric 500 MG CAPS Take 1,000 mg by mouth daily at 12 noon.   No facility-administered medications prior to visit.    Review of Systems  Constitutional: Negative.   Respiratory: Negative.   Cardiovascular: Negative.   Neurological: Negative.     Last CBC Lab Results  Component Value Date   WBC 10.5 09/15/2020  HGB 13.5 09/15/2020   HCT 41.6 09/15/2020   MCV 88 09/15/2020   MCH 28.7 09/15/2020   RDW 14.6 09/15/2020   PLT 357 46/56/8127   Last metabolic panel Lab Results  Component Value Date   GLUCOSE 87 09/15/2020   NA 140 09/15/2020   K 4.8 09/15/2020   CL 100 09/15/2020   CO2 24 09/15/2020   BUN 17 09/15/2020   CREATININE 0.66 09/15/2020   GFRNONAA 96 01/29/2020   GFRAA 110 01/29/2020   CALCIUM 10.0 09/15/2020   PROT 7.4 09/15/2020   ALBUMIN 4.6 09/15/2020   LABGLOB 2.8 09/15/2020   AGRATIO 1.6 09/15/2020   BILITOT 0.3 09/15/2020   ALKPHOS 91 09/15/2020   AST 23 09/15/2020   ALT 19 09/15/2020   ANIONGAP 6 03/28/2019       Objective     BP (!) 146/99 (BP Location: Right Arm, Patient Position: Sitting, Cuff Size: Large)   Pulse 74   Temp 98.8 F (37.1 C) (Oral)   Wt 265 lb (120.2 kg)   SpO2 100%   BMI 40.29 kg/m  BP Readings from Last 3 Encounters:  09/15/20 (!) 146/99  05/24/20 (!) 150/88  05/20/20 (!) 147/92   Wt Readings from Last 3 Encounters:  09/15/20 265 lb (120.2 kg)  05/24/20 254 lb (115.2 kg)  05/20/20 255 lb 3.2 oz (115.8 kg)       Physical Exam Vitals reviewed.  Constitutional:      General: She is not in acute distress.    Appearance: Normal appearance. She is well-developed and well-groomed. She is obese. She is not diaphoretic.  Cardiovascular:     Rate and Rhythm: Normal rate and regular rhythm.     Pulses: Normal pulses.     Heart sounds: Normal heart sounds. No murmur heard. No friction rub. No gallop.   Pulmonary:     Effort: Pulmonary effort is normal. No respiratory distress.     Breath sounds: Normal breath sounds. No wheezing or rales.  Musculoskeletal:     Cervical back: Normal range of motion and neck supple.  Neurological:     Mental Status: She is alert.     Gait: Gait abnormal (antalgic).  Psychiatric:        Behavior: Behavior is cooperative.       No results found for any visits on 09/15/20.  Assessment & Plan     1. Difficulty sleeping Worsening recently. Will give trazodone as below. Call if not helping.  - traZODone (DESYREL) 50 MG tablet; Take 0.5-2 tablets (25-100 mg total) by mouth at bedtime as needed for sleep.  Dispense: 60 tablet; Refill: 3  2. Ovarian cancer screening Will check labs as below and f/u pending results. - CA 125   No follow-ups on file.      Reynolds Bowl, PA-C, have reviewed all documentation for this visit. The documentation on 09/19/20 for the exam, diagnosis, procedures, and orders are all accurate and complete.   Rubye Beach  [facility] [phone] (phone) [phone]  (fax)  [place]

## 2020-09-16 LAB — CBC WITH DIFFERENTIAL/PLATELET
Basophils Absolute: 0.1 10*3/uL (ref 0.0–0.2)
Basos: 1 %
EOS (ABSOLUTE): 0.3 10*3/uL (ref 0.0–0.4)
Eos: 3 %
Hematocrit: 41.6 % (ref 34.0–46.6)
Hemoglobin: 13.5 g/dL (ref 11.1–15.9)
Immature Grans (Abs): 0 10*3/uL (ref 0.0–0.1)
Immature Granulocytes: 0 %
Lymphocytes Absolute: 2.4 10*3/uL (ref 0.7–3.1)
Lymphs: 23 %
MCH: 28.7 pg (ref 26.6–33.0)
MCHC: 32.5 g/dL (ref 31.5–35.7)
MCV: 88 fL (ref 79–97)
Monocytes Absolute: 0.8 10*3/uL (ref 0.1–0.9)
Monocytes: 8 %
Neutrophils Absolute: 6.9 10*3/uL (ref 1.4–7.0)
Neutrophils: 65 %
Platelets: 357 10*3/uL (ref 150–450)
RBC: 4.71 x10E6/uL (ref 3.77–5.28)
RDW: 14.6 % (ref 11.7–15.4)
WBC: 10.5 10*3/uL (ref 3.4–10.8)

## 2020-09-16 LAB — COMPREHENSIVE METABOLIC PANEL
ALT: 19 IU/L (ref 0–32)
AST: 23 IU/L (ref 0–40)
Albumin/Globulin Ratio: 1.6 (ref 1.2–2.2)
Albumin: 4.6 g/dL (ref 3.8–4.9)
Alkaline Phosphatase: 91 IU/L (ref 44–121)
BUN/Creatinine Ratio: 26 (ref 12–28)
BUN: 17 mg/dL (ref 8–27)
Bilirubin Total: 0.3 mg/dL (ref 0.0–1.2)
CO2: 24 mmol/L (ref 20–29)
Calcium: 10 mg/dL (ref 8.7–10.3)
Chloride: 100 mmol/L (ref 96–106)
Creatinine, Ser: 0.66 mg/dL (ref 0.57–1.00)
Globulin, Total: 2.8 g/dL (ref 1.5–4.5)
Glucose: 87 mg/dL (ref 65–99)
Potassium: 4.8 mmol/L (ref 3.5–5.2)
Sodium: 140 mmol/L (ref 134–144)
Total Protein: 7.4 g/dL (ref 6.0–8.5)
eGFR: 100 mL/min/{1.73_m2} (ref >59)

## 2020-09-16 LAB — LIPID PANEL WITH LDL/HDL RATIO
Cholesterol, Total: 217 mg/dL — ABNORMAL HIGH (ref 100–199)
HDL: 62 mg/dL (ref >39)
LDL Chol Calc (NIH): 131 mg/dL — ABNORMAL HIGH (ref 0–99)
LDL/HDL Ratio: 2.1 ratio (ref 0.0–3.2)
Triglycerides: 136 mg/dL (ref 0–149)
VLDL Cholesterol Cal: 24 mg/dL (ref 5–40)

## 2020-09-16 LAB — HEMOGLOBIN A1C
Est. average glucose Bld gHb Est-mCnc: 114 mg/dL
Hgb A1c MFr Bld: 5.6 % (ref 4.8–5.6)

## 2020-09-16 LAB — CA 125: Cancer Antigen (CA) 125: 15.3 U/mL (ref 0.0–38.1)

## 2020-09-16 LAB — TSH: TSH: 1.96 u[IU]/mL (ref 0.450–4.500)

## 2020-09-19 ENCOUNTER — Encounter: Payer: Self-pay | Admitting: Physician Assistant

## 2020-09-23 ENCOUNTER — Encounter: Payer: Managed Care, Other (non HMO) | Admitting: Dermatology

## 2020-11-08 ENCOUNTER — Other Ambulatory Visit: Payer: Self-pay | Admitting: Physician Assistant

## 2020-11-08 DIAGNOSIS — I10 Essential (primary) hypertension: Secondary | ICD-10-CM

## 2020-11-08 MED ORDER — AMLODIPINE BESYLATE 10 MG PO TABS: 10.0000 mg | ORAL_TABLET | Freq: Every day | ORAL | 1 refills | Status: DC

## 2020-11-08 MED ORDER — LOSARTAN POTASSIUM 100 MG PO TABS: ORAL_TABLET | ORAL | 1 refills | Status: DC

## 2020-11-08 NOTE — Telephone Encounter (Signed)
Medication Refill - Medication amLODipine (NORVASC) 10 MG tablet losartan (COZAAR) 100 MG tablet     Preferred Pharmacy (with phone number or street name):  [id number] [address], [place] Phone:  [phone]  Fax:  [phone]       Agent: Please be advised that RX refills may take up to 3 business days. We ask that you follow-up with your pharmacy.

## 2020-11-08 NOTE — Telephone Encounter (Signed)
   Notes to clinic: medication requested have expired  Review for continued use and refill   Requested Prescriptions  Pending Prescriptions Disp Refills   amLODipine (NORVASC) 10 MG tablet 90 tablet 1    Sig: Take 1 tablet (10 mg total) by mouth daily.      There is no refill protocol information for this order      losartan (COZAAR) 100 MG tablet 90 tablet 1    Sig: TAKE 1 TABLET(100 MG) BY MOUTH DAILY      There is no refill protocol information for this order

## 2020-11-15 ENCOUNTER — Other Ambulatory Visit: Payer: Self-pay | Admitting: Family Medicine

## 2020-11-15 DIAGNOSIS — G479 Sleep disorder, unspecified: Secondary | ICD-10-CM

## 2020-11-15 MED ORDER — TRAZODONE HCL 50 MG PO TABS: 25.0000 mg | ORAL_TABLET | Freq: Every evening | ORAL | 1 refills | Status: DC | PRN

## 2021-01-06 ENCOUNTER — Other Ambulatory Visit: Payer: Self-pay | Admitting: Family Medicine

## 2021-01-06 DIAGNOSIS — B009 Herpesviral infection, unspecified: Secondary | ICD-10-CM

## 2021-01-06 MED ORDER — ACYCLOVIR 400 MG PO TABS: 400.0000 mg | ORAL_TABLET | Freq: Two times a day (BID) | ORAL | 0 refills | Status: DC

## 2021-01-06 NOTE — Telephone Encounter (Signed)
Copied from [place] [phone]. Topic: Quick Communication - Rx Refill/Question >> Jan 06, 2021 11:01 AM Leward Quan A wrote: Medication: acyclovir (ZOVIRAX) 400 MG tablet  90 day supply for insurance to pay please  Has the patient contacted their pharmacy? Yes.   (Agent: If no, request that the patient contact the pharmacy for the refill.) (Agent: If yes, when and what did the pharmacy advise?)  Preferred Pharmacy (with phone number or street name): [facility] [place], [place] - [address]  Phone:  [phone] Fax:  [phone]     Agent: Please be advised that RX refills may take up to 3 business days. We ask that you follow-up with your pharmacy.

## 2021-01-06 NOTE — Telephone Encounter (Signed)
Future visit in 1 month  

## 2021-01-27 ENCOUNTER — Ambulatory Visit: Payer: Managed Care, Other (non HMO) | Admitting: Dermatology

## 2021-02-10 ENCOUNTER — Other Ambulatory Visit: Payer: Self-pay | Admitting: Family Medicine

## 2021-02-10 DIAGNOSIS — I10 Essential (primary) hypertension: Secondary | ICD-10-CM

## 2021-02-25 NOTE — Progress Notes (Deleted)
Established patient visit   Patient: Rita Foster   DOB: May 25, 1961   60 y.o. Female  MRN: [id number] Visit Date: 02/28/2021  Today's healthcare provider: Vernie Murders, PA-C   No chief complaint on file.  Subjective  -------------------------------------------------------------------------------------------------------------------- HPI  Hypertension, follow-up  BP Readings from Last 3 Encounters:  09/15/20 (!) 146/99  05/24/20 (!) 150/88  05/20/20 (!) 147/92   Wt Readings from Last 3 Encounters:  09/15/20 265 lb (120.2 kg)  05/24/20 254 lb (115.2 kg)  05/20/20 255 lb 3.2 oz (115.8 kg)     She was last seen for hypertension 5 months ago.  BP at that visit was 146/99. Management since that visit includes; on amlodipine and losartan. She reports {excellent/good/fair/poor:19665} compliance with treatment. She {is/is not:9024} having side effects. {document side effects if present:1} She {is/is not:9024} exercising. She {is/is not:9024} adherent to low salt diet.   Outside blood pressures are {enter patient reported home BP, or 'not being checked':1}.  She {does/does not:200015} smoke.  Use of agents associated with hypertension: {bp agents assoc with hypertension:511::"none"}.   --------------------------------------------------------------------------------------------------- Lipid/Cholesterol, follow-up  Last Lipid Panel: Lab Results  Component Value Date   CHOL 217 (H) 09/15/2020   LDLCALC 131 (H) 09/15/2020   HDL 62 09/15/2020   TRIG 136 09/15/2020    She was last seen for this 5 months ago.  Management since that visit includes; not currently on a medication.  She reports {excellent/good/fair/poor:19665} compliance with treatment. She {is/is not:9024} having side effects. {document side effects if present:1}  She is following a {diet:21022986} diet. Current exercise: {exercise 99991111  Last metabolic panel Lab Results  Component Value  Date   GLUCOSE 87 09/15/2020   NA 140 09/15/2020   K 4.8 09/15/2020   BUN 17 09/15/2020   CREATININE 0.66 09/15/2020   GFRNONAA 96 01/29/2020   GFRAA 110 01/29/2020   CALCIUM 10.0 09/15/2020   AST 23 09/15/2020   ALT 19 09/15/2020   The 10-year ASCVD risk score Mikey Bussing DC Jr., et al., 2013) is: 6.1%  ---------------------------------------------------------------------------------------------------   {Show patient history (optional):23778}   Medications: Outpatient Medications Prior to Visit  Medication Sig   acetaminophen (TYLENOL) 500 MG tablet Take 650 mg by mouth at bedtime as needed for mild pain or moderate pain.    acyclovir (ZOVIRAX) 400 MG tablet Take 1 tablet (400 mg total) by mouth 2 (two) times daily.   amLODipine (NORVASC) 10 MG tablet TAKE 1 TABLET(10 MG) BY MOUTH DAILY   Biotin w/ Vitamins C & E (HAIR SKIN & NAILS GUMMIES PO) Take 1 tablet by mouth daily.   Blood Pressure Monitoring (BLOOD PRESSURE MONITOR/WRIST) DEVI To check bp daily   diclofenac Sodium (VOLTAREN) 1 % GEL Apply 2 g topically in the morning and at bedtime.   losartan (COZAAR) 100 MG tablet TAKE 1 TABLET(100 MG) BY MOUTH DAILY   Misc Natural Products (OSTEO BI-FLEX JOINT SHIELD) TABS Take 2 tablets by mouth daily at 12 noon.    Multiple Vitamin tablet Take 1 tablet by mouth daily.    NON FORMULARY rawleigh medicated ointment   OMEPRAZOLE PO Take 1 capsule by mouth daily. (Patient not taking: Reported on 09/15/2020)   Probiotic Product (PROBIOTIC PO) Take by mouth.   traZODone (DESYREL) 50 MG tablet Take 0.5-2 tablets (25-100 mg total) by mouth at bedtime as needed for sleep.   Turmeric 500 MG CAPS Take 1,000 mg by mouth daily at 12 noon.   No facility-administered medications prior to visit.  Review of Systems  Constitutional:  Negative for appetite change, chills, fatigue and fever.  Respiratory:  Negative for chest tightness and shortness of breath.   Cardiovascular:  Negative for chest pain  and palpitations.  Gastrointestinal:  Negative for abdominal pain, nausea and vomiting.  Neurological:  Negative for dizziness and weakness.   {Labs  Heme  Chem  Endocrine  Serology  Results Review (optional):23779}   Objective  -------------------------------------------------------------------------------------------------------------------- There were no vitals taken for this visit. {Show previous vital signs (optional):23777}   Physical Exam  ***  No results found for any visits on 02/28/21.  Assessment & Plan  ---------------------------------------------------------------------------------------------------------------------- ***  No follow-ups on file.      {provider attestation***:1}   Vernie Murders, Hershal Coria  [facility] [phone] (phone) [phone] (fax)  [place]

## 2021-02-28 ENCOUNTER — Other Ambulatory Visit: Payer: Self-pay

## 2021-02-28 ENCOUNTER — Encounter: Payer: Self-pay | Admitting: Family Medicine

## 2021-02-28 ENCOUNTER — Ambulatory Visit: Payer: Managed Care, Other (non HMO) | Admitting: Family Medicine

## 2021-02-28 VITALS — BP 144/86 | HR 70 | Temp 99.1°F | Ht 68.0 in | Wt 265.0 lb

## 2021-02-28 DIAGNOSIS — M25561 Pain in right knee: Secondary | ICD-10-CM

## 2021-02-28 DIAGNOSIS — G8929 Other chronic pain: Secondary | ICD-10-CM

## 2021-02-28 DIAGNOSIS — Z6841 Body Mass Index (BMI) 40.0 and over, adult: Secondary | ICD-10-CM

## 2021-02-28 DIAGNOSIS — M25562 Pain in left knee: Secondary | ICD-10-CM | POA: Diagnosis not present

## 2021-02-28 NOTE — Progress Notes (Signed)
Established patient visit   Patient: Rita Foster   DOB: [birth year]   60 y.o. Female  MRN: [id number] Visit Date: 02/28/2021  Today's healthcare provider: Vernie Murders, PA-C   No chief complaint on file.  Subjective  -------------------------------------------------------------------------------------------------------------------- HPI  Patient is a 60 year old female who presents to discuss failed Biometric screening.  Patient reports that she is unable to do exercise due to bad knees.  She would like to have this documented so she is able to get an exemption for the increased insurance rate.  No past medical history on file. Past Surgical History:  Procedure Laterality Date   BREAST BIOPSY Left 10+ years ago   Negative core   BREAST SURGERY  2002   biopsy   CATARACT EXTRACTION Right 07/2014   HERNIA REPAIR  02/21/2010   TONSILLECTOMY AND ADENOIDECTOMY  1968   Social History   Tobacco Use   Smoking status: Former    Years: 5.00    Types: Cigarettes    Quit date: 07/03/1999    Years since quitting: 21.6   Smokeless tobacco: Never  Substance Use Topics   Alcohol use: No   Drug use: No   Family Status  Relation Name Status   Mother  Alive   Father  Alive   MGM  Deceased at age 68       natural causes   MGF  Deceased       artherosclerosis   PGM  Deceased       old age   PGF  Deceased       unknown causes   Allergies  Allergen Reactions   Penicillins        Medications: Outpatient Medications Prior to Visit  Medication Sig   acetaminophen (TYLENOL) 500 MG tablet Take 650 mg by mouth at bedtime as needed for mild pain or moderate pain.    acyclovir (ZOVIRAX) 400 MG tablet Take 1 tablet (400 mg total) by mouth 2 (two) times daily.   amLODipine (NORVASC) 10 MG tablet TAKE 1 TABLET(10 MG) BY MOUTH DAILY   Biotin w/ Vitamins C & E (HAIR SKIN & NAILS GUMMIES PO) Take 1 tablet by mouth daily.   Blood Pressure Monitoring (BLOOD PRESSURE  MONITOR/WRIST) DEVI To check bp daily   diclofenac Sodium (VOLTAREN) 1 % GEL Apply 2 g topically in the morning and at bedtime.   losartan (COZAAR) 100 MG tablet TAKE 1 TABLET(100 MG) BY MOUTH DAILY   Misc Natural Products (OSTEO BI-FLEX JOINT SHIELD) TABS Take 2 tablets by mouth daily at 12 noon.    Multiple Vitamin tablet Take 1 tablet by mouth daily.    NON FORMULARY rawleigh medicated ointment   Probiotic Product (PROBIOTIC PO) Take by mouth.   traZODone (DESYREL) 50 MG tablet Take 0.5-2 tablets (25-100 mg total) by mouth at bedtime as needed for sleep.   Turmeric 500 MG CAPS Take 1,000 mg by mouth daily at 12 noon.   OMEPRAZOLE PO Take 1 capsule by mouth daily.   No facility-administered medications prior to visit.    Review of Systems  Constitutional: Negative.   HENT: Negative.    Respiratory: Negative.    Cardiovascular: Negative.   Musculoskeletal:  Positive for arthralgias.      Objective  -------------------------------------------------------------------------------------------------------------------- BP (!) 144/86 (BP Location: Right Arm, Patient Position: Sitting, Cuff Size: Large)   Pulse 70   Temp 99.1 F (37.3 C) (Oral)   Ht '5\' 8"'$  (1.727 m)   Wt  265 lb (120.2 kg)   SpO2 98%   BMI 40.29 kg/m     Physical Exam Constitutional:      General: She is not in acute distress.    Appearance: She is well-developed.  HENT:     Head: Normocephalic and atraumatic.     Right Ear: Hearing normal.     Left Ear: Hearing normal.     Nose: Nose normal.  Eyes:     General: Lids are normal. No scleral icterus.       Right eye: No discharge.        Left eye: No discharge.     Conjunctiva/sclera: Conjunctivae normal.  Pulmonary:     Effort: Pulmonary effort is normal. No respiratory distress.  Musculoskeletal:        General: Normal range of motion.  Skin:    Findings: No lesion or rash.  Neurological:     Mental Status: She is alert and oriented to person, place,  and time.  Psychiatric:        Speech: Speech normal.        Behavior: Behavior normal.        Thought Content: Thought content normal.      No results found for any visits on 02/28/21.  Assessment & Plan  ---------------------------------------------------------------------------------------------------------------------- 1. 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 Had steroid injections 02-16-21 by orthopedist. Unable to walk far or exercise due to chronic pain. 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 Will get any forms needed to help with biometric screening at work to confirm inability to exercise for obesity. This may help keep insurance from increasing premiums.  2. Morbid obesity with BMI of 40.0-44.9, adult ([place]) Difficulty trying to lose weight with severe degenerative joint disease both knees. Recommended using a cane for walking and may try water aerobics for exercise. Should reduce caloric intake to help with weight loss.   No follow-ups on file.      I, Arianne Klinge, PA-C, have reviewed all documentation for this visit. The documentation on 02/28/21 for the exam, diagnosis, procedures, and orders are all accurate and complete.    Vernie Murders, PA-C  [employer] [phone] (phone) [phone] (fax)  [place]

## 2021-04-22 ENCOUNTER — Telehealth: Payer: Self-pay

## 2021-04-22 DIAGNOSIS — Z1231 Encounter for screening mammogram for malignant neoplasm of breast: Secondary | ICD-10-CM

## 2021-04-22 NOTE — Telephone Encounter (Signed)
Copied from [place] [phone]. Topic: General - Other >> Apr 22, 2021  9:12 AM Greggory Keen D wrote: Reason for CRM: Pt called and needs an order for a mammogram at [facility].

## 2021-04-26 NOTE — Addendum Note (Signed)
Addended by: Minette Headland on: 04/26/2021 05:08 PM   Modules accepted: Orders

## 2021-05-04 ENCOUNTER — Other Ambulatory Visit: Payer: Self-pay | Admitting: Family Medicine

## 2021-05-04 ENCOUNTER — Other Ambulatory Visit: Payer: Self-pay

## 2021-05-04 ENCOUNTER — Telehealth: Payer: Self-pay

## 2021-05-04 DIAGNOSIS — B009 Herpesviral infection, unspecified: Secondary | ICD-10-CM

## 2021-05-04 DIAGNOSIS — Z1231 Encounter for screening mammogram for malignant neoplasm of breast: Secondary | ICD-10-CM

## 2021-05-04 DIAGNOSIS — I10 Essential (primary) hypertension: Secondary | ICD-10-CM

## 2021-05-04 MED ORDER — ACYCLOVIR 400 MG PO TABS: 400.0000 mg | ORAL_TABLET | Freq: Two times a day (BID) | ORAL | 0 refills | Status: DC

## 2021-05-04 MED ORDER — LOSARTAN POTASSIUM 100 MG PO TABS: ORAL_TABLET | ORAL | 0 refills | Status: DC

## 2021-05-04 NOTE — Telephone Encounter (Signed)
The last proair inhaler I see is from 2019 since then no documentation of any SOB or asthma, if she feels she needs it, needs a visit

## 2021-05-04 NOTE — Telephone Encounter (Signed)
Requested Prescriptions  Pending Prescriptions Disp Refills  . acyclovir (ZOVIRAX) 400 MG tablet 180 tablet 0    Sig: Take 1 tablet (400 mg total) by mouth 2 (two) times daily.     Antimicrobials:  Antiviral Agents - Anti-Herpetic Passed - 05/04/2021  3:39 PM      Passed - Valid encounter within last 12 months    Recent Outpatient Visits          2 months ago Chronic pain of both knees   [place], PA-C   7 months ago Difficulty sleeping   [facility] [place], [place]   11 months ago Annual physical exam   [facility] [place], [place] Sorrel, [place]   1 year ago Epigastric pain   [facility] [place], [place] Sor[place], [place]   1 year ago Annual physical exam   [facility] [place], [place] Sorrel, [place]      Future Appointments            In 5 days Ralene Bathe, MD [place]   In 4 weeks [name], FNP [facility], [facility]           . amLODipine (NORVASC) 10 MG tablet 90 tablet 1     Cardiovascular:  Calcium Channel Blockers Failed - 05/04/2021  3:39 PM      Failed - Last BP in normal range    BP Readings from Last 1 Encounters:  02/28/21 (!) 144/86         Passed - Valid encounter within last 6 months    Recent Outpatient Visits          2 months ago Chronic pain of both knees   [place], PA-C   7 months ago Difficulty sleeping   [facility] [place] M[place], [place]   11 months ago Annual physical exam   [place], Clearnce Sorrel, [place]   1 year ago Epigastric pain   [place], Clearnce Sorrel, [place]   1 year ago Annual physical exam   [place], [place] Sorrel, [place]      Future Appointments            In 5 days Ralene Bathe, MD [place]   In 4 weeks [name], FNP [facility], [facility]            . losartan (COZAAR) 100 MG tablet 90 tablet 1    Sig: TAKE 1 TABLET(100 MG) BY MOUTH DAILY     Cardiovascular:  Angiotensin Receptor Blockers Failed - 05/04/2021  3:39 PM      Failed - Cr in normal range and within 180 days    Creatinine, Ser  Date Value Ref Range Status  09/15/2020 0.66 0.57 - 1.00 mg/dL Final         Failed - K in normal range and within 180 days    Potassium  Date Value Ref Range Status  09/15/2020 4.8 3.5 - 5.2 mmol/L Final         Failed - Last BP in normal range    BP Readings from Last 1 Encounters:  02/28/21 (!) 144/86         Passed - Patient is not pregnant      Passed - Valid encounter within last 6 months    Recent Outpatient Visits  2 months ago Chronic pain of both knees   [place], PA-C   7 months ago Difficulty sleeping   [facility] [place], [place]   11 months ago Annual physical exam   [facility] [place], Clearnce Sorrel, [place]   1 year ago Epigastric pain   [facility] [place], [place] Sorrel, [place]   1 year ago Annual physical exam   [facility] [place], Clearnce Sorrel, [place]      Future Appointments            In 5 days Ralene Bathe, MD [place]   In 4 weeks [name], [place], [facility]

## 2021-05-04 NOTE — Telephone Encounter (Signed)
Patient requesting Advair inhaler to have on hand. Not listed on current or past medication list.

## 2021-05-04 NOTE — Telephone Encounter (Signed)
Medication Refill - Medication: amLODipine (NORVASC) 10 MG tablet losartan (COZAAR) 100 MG tablet  acyclovir (ZOVIRAX) 400 MG tablet   Requesting 90 day Rx  Also requesting Advair, just to have in case.   Has the patient contacted their pharmacy? Yes.   (Agent: If no, request that the patient contact the pharmacy for the refill. If patient does not wish to contact the pharmacy document the reason why and proceed with request.) (Agent: If yes, when and what did the pharmacy advise?)  Preferred Pharmacy (with phone number or street name):  [facility] [place], [address]  [place] [address]  Phone: [phone] Fax: [phone]   Has the patient been seen for an appointment in the last year OR does the patient have an upcoming appointment? Yes.    Agent: Please be advised that RX refills may take up to 3 business days. We ask that you follow-up with your pharmacy.

## 2021-05-09 ENCOUNTER — Ambulatory Visit: Payer: Managed Care, Other (non HMO) | Admitting: Dermatology

## 2021-05-09 ENCOUNTER — Other Ambulatory Visit: Payer: Self-pay

## 2021-05-09 DIAGNOSIS — L609 Nail disorder, unspecified: Secondary | ICD-10-CM | POA: Diagnosis not present

## 2021-05-09 DIAGNOSIS — L814 Other melanin hyperpigmentation: Secondary | ICD-10-CM

## 2021-05-09 DIAGNOSIS — L820 Inflamed seborrheic keratosis: Secondary | ICD-10-CM

## 2021-05-09 DIAGNOSIS — Z1283 Encounter for screening for malignant neoplasm of skin: Secondary | ICD-10-CM

## 2021-05-09 DIAGNOSIS — L570 Actinic keratosis: Secondary | ICD-10-CM | POA: Diagnosis not present

## 2021-05-09 DIAGNOSIS — B079 Viral wart, unspecified: Secondary | ICD-10-CM

## 2021-05-09 DIAGNOSIS — D229 Melanocytic nevi, unspecified: Secondary | ICD-10-CM

## 2021-05-09 DIAGNOSIS — L578 Other skin changes due to chronic exposure to nonionizing radiation: Secondary | ICD-10-CM

## 2021-05-09 DIAGNOSIS — D1800 Hemangioma unspecified site: Secondary | ICD-10-CM

## 2021-05-09 DIAGNOSIS — L821 Other seborrheic keratosis: Secondary | ICD-10-CM

## 2021-05-09 NOTE — Patient Instructions (Signed)

## 2021-05-09 NOTE — Progress Notes (Signed)
Follow-Up Visit   Subjective  Rita Foster is a 60 y.o. female who presents for the following: Annual Exam (Patient has noticed skin lesions on the R lat wrist, L upper arm, and L thumb that she would like checked today.). The patient presents for Total-Body Skin Exam (TBSE) for skin cancer screening and mole check.  The following portions of the chart were reviewed this encounter and updated as appropriate:   Tobacco  Allergies  Meds  Problems  Med Hx  Surg Hx  Fam Hx     Review of Systems:  No other skin or systemic complaints except as noted in HPI or Assessment and Plan.  Objective  Well appearing patient in no apparent distress; mood and affect are within normal limits.  A full examination was performed including scalp, head, eyes, ears, nose, lips, neck, chest, axillae, abdomen, back, buttocks, bilateral upper extremities, bilateral lower extremities, hands, feet, fingers, toes, fingernails, and toenails. All findings within normal limits unless otherwise noted below.  Nose x 1 Erythematous thin papules/macules with gritty scale.   Face, arms, and legs x 20 (20) Erythematous keratotic or waxy stuck-on papule or plaque.   R great toe nail Toenail dystrophy.   R thumb tip x 1 Verrucous papules -- Discussed viral etiology and contagion.    Assessment & Plan  AK (actinic keratosis) Nose x 1  Destruction of lesion - Nose x 1 Complexity: simple   Destruction method: cryotherapy   Informed consent: discussed and consent obtained   Timeout:  patient name, date of birth, surgical site, and procedure verified Lesion destroyed using liquid nitrogen: Yes   Region frozen until ice ball extended beyond lesion: Yes   Outcome: patient tolerated procedure well with no complications   Post-procedure details: wound care instructions given    Inflamed seborrheic keratosis Face, arms, and legs x 20  Destruction of lesion - Face, arms, and legs x 20 Complexity: simple    Destruction method: cryotherapy   Informed consent: discussed and consent obtained   Timeout:  patient name, date of birth, surgical site, and procedure verified Lesion destroyed using liquid nitrogen: Yes   Region frozen until ice ball extended beyond lesion: Yes   Outcome: patient tolerated procedure well with no complications   Post-procedure details: wound care instructions given    Nail problem R great toe nail  Benign appearing separation of the nail from the nail bed. No tx needed today.  Viral warts, unspecified type R thumb tip x 1  Discussed viral etiology and risk of spread.  Discussed multiple treatments may be required to clear warts.  Discussed possible post-treatment dyspigmentation and risk of recurrence.  Destruction of lesion - R thumb tip x 1 Complexity: simple   Destruction method: cryotherapy   Informed consent: discussed and consent obtained   Timeout:  patient name, date of birth, surgical site, and procedure verified Lesion destroyed using liquid nitrogen: Yes   Region frozen until ice ball extended beyond lesion: Yes   Outcome: patient tolerated procedure well with no complications   Post-procedure details: wound care instructions given    Destruction of lesion - R thumb tip x 1 Complexity: simple   Destruction method: chemical removal   Timeout:  patient name, date of birth, surgical site, and procedure verified Chemical destruction method: cantharidin    Skin cancer screening  Lentigines - Scattered tan macules - Due to sun exposure - Benign-appearing, observe - Recommend daily broad spectrum sunscreen SPF 30+ to sun-exposed areas,  reapply every 2 hours as needed. - Call for any changes  Seborrheic Keratoses - Stuck-on, waxy, tan-brown papules and/or plaques  - Benign-appearing - Discussed benign etiology and prognosis. - Observe - Call for any changes  Melanocytic Nevi - Tan-brown and/or pink-flesh-colored symmetric macules and  papules - Benign appearing on exam today - Observation - Call clinic for new or changing moles - Recommend daily use of broad spectrum spf 30+ sunscreen to sun-exposed areas.   Hemangiomas - Red papules - Discussed benign nature - Observe - Call for any changes  Actinic Damage - Chronic condition, secondary to cumulative UV/sun exposure - diffuse scaly erythematous macules with underlying dyspigmentation - Recommend daily broad spectrum sunscreen SPF 30+ to sun-exposed areas, reapply every 2 hours as needed.  - Staying in the shade or wearing long sleeves, sun glasses (UVA+UVB protection) and wide brim hats (4-inch brim around the entire circumference of the hat) are also recommended for sun protection.  - Call for new or changing lesions.  Skin cancer screening performed today.  Return in about 1 year (around 05/09/2022) for TBSE.  Luther Redo, CMA, am acting as scribe for Sarina Ser, MD . Documentation: I have reviewed the above documentation for accuracy and completeness, and I agree with the above.  Sarina Ser, MD

## 2021-05-10 ENCOUNTER — Encounter: Payer: Self-pay | Admitting: Dermatology

## 2021-06-01 ENCOUNTER — Ambulatory Visit (INDEPENDENT_AMBULATORY_CARE_PROVIDER_SITE_OTHER): Payer: Managed Care, Other (non HMO) | Admitting: Family Medicine

## 2021-06-01 ENCOUNTER — Other Ambulatory Visit: Payer: Self-pay

## 2021-06-01 ENCOUNTER — Other Ambulatory Visit (HOSPITAL_COMMUNITY)
Admission: RE | Admit: 2021-06-01 | Discharge: 2021-06-01 | Disposition: A | Discharge status: Home / Self Care | Payer: Managed Care, Other (non HMO) | Source: Ambulatory Visit | Attending: Family Medicine | Admitting: Family Medicine

## 2021-06-01 ENCOUNTER — Other Ambulatory Visit: Payer: Self-pay | Admitting: Family Medicine

## 2021-06-01 ENCOUNTER — Encounter: Payer: Self-pay | Admitting: Family Medicine

## 2021-06-01 VITALS — BP 128/77 | HR 77 | Resp 16 | Ht 68.0 in | Wt 263.2 lb

## 2021-06-01 DIAGNOSIS — Z124 Encounter for screening for malignant neoplasm of cervix: Secondary | ICD-10-CM | POA: Diagnosis not present

## 2021-06-01 DIAGNOSIS — E7800 Pure hypercholesterolemia, unspecified: Secondary | ICD-10-CM | POA: Diagnosis not present

## 2021-06-01 DIAGNOSIS — Z23 Encounter for immunization: Secondary | ICD-10-CM | POA: Diagnosis not present

## 2021-06-01 DIAGNOSIS — J4520 Mild intermittent asthma, uncomplicated: Secondary | ICD-10-CM

## 2021-06-01 DIAGNOSIS — Z131 Encounter for screening for diabetes mellitus: Secondary | ICD-10-CM | POA: Insufficient documentation

## 2021-06-01 DIAGNOSIS — Z1231 Encounter for screening mammogram for malignant neoplasm of breast: Secondary | ICD-10-CM | POA: Insufficient documentation

## 2021-06-01 DIAGNOSIS — Z0000 Encounter for general adult medical examination without abnormal findings: Secondary | ICD-10-CM | POA: Diagnosis not present

## 2021-06-01 DIAGNOSIS — R799 Abnormal finding of blood chemistry, unspecified: Secondary | ICD-10-CM | POA: Insufficient documentation

## 2021-06-01 MED ORDER — DULOXETINE HCL 60 MG PO CPEP: 60.0000 mg | ORAL_CAPSULE | Freq: Every day | ORAL | 1 refills | Status: DC

## 2021-06-01 MED ORDER — ROPINIROLE HCL 0.5 MG PO TABS: 0.5000 mg | ORAL_TABLET | Freq: Every day | ORAL | 1 refills | Status: DC

## 2021-06-01 MED ORDER — ALBUTEROL SULFATE HFA 108 (90 BASE) MCG/ACT IN AERS
2.0000 | INHALATION_SPRAY | Freq: Four times a day (QID) | RESPIRATORY_TRACT | 2 refills | Status: DC | PRN
Start: 2021-06-01 — End: 2023-04-23

## 2021-06-01 NOTE — Assessment & Plan Note (Signed)
Due for eye exam UTD on dental Things to do to keep yourself healthy  - Exercise at least 30-45 minutes a day, 3-4 days a week.  - Eat a low-fat diet with lots of fruits and vegetables, up to 7-9 servings per day.  - Seatbelts can save your life. Wear them always.  - Smoke detectors on every level of your home, check batteries every year.  - Eye Doctor - have an eye exam every 1-2 years  - Safe sex - if you may be exposed to STDs, use a condom.  - Alcohol -  If you drink, do it moderately, less than 2 drinks per day.  - [place]. Choose someone to speak for you if you are not able.  - Depression is common in our stressful world.If you're feeling down or losing interest in things you normally enjoy, please come in for a visit.  - Violence - If anyone is threatening or hurting you, please call immediately.

## 2021-06-01 NOTE — Assessment & Plan Note (Signed)
A1c screening; BMI 40

## 2021-06-01 NOTE — Assessment & Plan Note (Signed)
Consent reviewed; provided

## 2021-06-01 NOTE — Progress Notes (Signed)
Complete physical exam   Patient: Rita Foster   DOB: 06/20/1961   60 y.o. Female  MRN: [id number] Visit Date: 06/01/2021  Today's healthcare provider: [name], FNP   Chief Complaint  Patient presents with   Annual Exam   Subjective     HPI  Rita Foster is a 60 y.o. female who presents today for a complete physical exam.  She reports consuming a general diet. The patient does not participate in regular exercise at present. She generally feels poorly. She reports sleeping poorly. She does have additional problems to discuss today, chronic pain in both knees and behind knees.   History reviewed. No pertinent past medical history. Past Surgical History:  Procedure Laterality Date   BREAST BIOPSY Left 10+ years ago   Negative core   BREAST SURGERY  2002   biopsy   CATARACT EXTRACTION Right 07/2014   HERNIA REPAIR  02/21/2010   TONSILLECTOMY AND ADENOIDECTOMY  1968   Social History   Socioeconomic History   Marital status: Divorced    Spouse name: Not on file   Number of children: Not on file   Years of education: Not on file   Highest education level: Not on file  Occupational History   Not on file  Tobacco Use   Smoking status: Former    Years: 5.00    Types: Cigarettes    Quit date: 07/03/1999    Years since quitting: 21.9   Smokeless tobacco: Never  Substance and Sexual Activity   Alcohol use: No   Drug use: No   Sexual activity: Not on file  Other Topics Concern   Not on file  Social History Narrative   Not on file   Social Determinants of Health   Financial Resource Strain: Not on file  Food Insecurity: Not on file  Transportation Needs: Not on file  Physical Activity: Not on file  Stress: Not on file  Social Connections: Not on file  Intimate Partner Violence: Not on file   Family Status  Relation Name Status   Mother  Deceased   Father  Alive   MGM  Deceased at age 67       natural causes   MGF  Deceased        artherosclerosis   PGM  Deceased       old age   PGF  Deceased       unknown causes   Family History  Problem Relation Age of Onset   Crohn's disease Mother    Thyroid disease Mother    Rheum arthritis Mother    Breast cancer Mother 36   Hypertension Father    Skin cancer Father    Allergies  Allergen Reactions   Gabapentin Other (See Comments)    dizziness   Mobic [Meloxicam] Other (See Comments)    Abdominal pain   Penicillins     Patient Care Team: [name], FNP as PCP - General (Family Medicine)   Medications: Outpatient Medications Prior to Visit  Medication Sig   acetaminophen (TYLENOL) 500 MG tablet Take 650 mg by mouth at bedtime as needed for mild pain or moderate pain.    acyclovir (ZOVIRAX) 400 MG tablet Take 1 tablet (400 mg total) by mouth 2 (two) times daily.   amLODipine (NORVASC) 10 MG tablet TAKE 1 TABLET(10 MG) BY MOUTH DAILY   Biotin w/ Vitamins C & E (HAIR SKIN & NAILS GUMMIES PO) Take 1 tablet by mouth  daily.   Blood Pressure Monitoring (BLOOD PRESSURE MONITOR/WRIST) DEVI To check bp daily   diclofenac (VOLTAREN) 50 MG EC tablet Take 50 mg by mouth 2 (two) times daily.   diclofenac Sodium (VOLTAREN) 1 % GEL Apply 2 g topically in the morning and at bedtime.   losartan (COZAAR) 100 MG tablet TAKE 1 TABLET(100 MG) BY MOUTH DAILY   Misc Natural Products (OSTEO BI-FLEX JOINT SHIELD) TABS Take 2 tablets by mouth daily at 12 noon.    Multiple Vitamin tablet Take 1 tablet by mouth daily.    Probiotic Product (PROBIOTIC PO) Take by mouth.   traZODone (DESYREL) 50 MG tablet Take 0.5-2 tablets (25-100 mg total) by mouth at bedtime as needed for sleep.   Turmeric 500 MG CAPS Take 1,000 mg by mouth daily at 12 noon.   [DISCONTINUED] NON FORMULARY rawleigh medicated ointment   [DISCONTINUED] OMEPRAZOLE PO Take 1 capsule by mouth daily.   No facility-administered medications prior to visit.    Review of Systems  Musculoskeletal:  Positive for arthralgias,  back pain, gait problem, joint swelling and myalgias.  Psychiatric/Behavioral:  Positive for sleep disturbance.   All other systems reviewed and are negative.    Objective    BP 128/77   Pulse 77   Resp 16   Ht 5\' 8"  (1.727 m)   Wt 263 lb 3.2 oz (119.4 kg)   SpO2 99%   BMI 40.02 kg/m    Physical Exam Vitals and nursing note reviewed. Exam conducted with a chaperone present.  Constitutional:      General: She is awake. She is not in acute distress.    Appearance: Normal appearance. She is well-developed and well-groomed. She is obese. She is not ill-appearing, toxic-appearing or diaphoretic.  HENT:     Head: Normocephalic and atraumatic.     Jaw: There is normal jaw occlusion. No trismus, tenderness, swelling or pain on movement.     Right Ear: Hearing, tympanic membrane, ear canal and external ear normal. There is no impacted cerumen.     Left Ear: Hearing, tympanic membrane, ear canal and external ear normal. There is no impacted cerumen.     Nose: Nose normal. No congestion or rhinorrhea.     Right Turbinates: Not enlarged, swollen or pale.     Left Turbinates: Not enlarged, swollen or pale.     Right Sinus: No maxillary sinus tenderness or frontal sinus tenderness.     Left Sinus: No maxillary sinus tenderness or frontal sinus tenderness.     Mouth/Throat:     Lips: Pink.     Mouth: Mucous membranes are moist. No injury.     Tongue: No lesions.     Pharynx: Oropharynx is clear. Uvula midline. No pharyngeal swelling, oropharyngeal exudate, posterior oropharyngeal erythema or uvula swelling.     Tonsils: No tonsillar exudate or tonsillar abscesses.  Eyes:     General: Lids are normal. Lids are everted, no foreign bodies appreciated. Vision grossly intact. Gaze aligned appropriately. No allergic shiner or visual field deficit.       Right eye: No discharge.        Left eye: No discharge.     Extraocular Movements: Extraocular movements intact.     Conjunctiva/sclera:  Conjunctivae normal.     Right eye: Right conjunctiva is not injected. No exudate.    Left eye: Left conjunctiva is not injected. No exudate.    Pupils: Pupils are equal, round, and reactive to light.  Neck:  Thyroid: No thyroid mass, thyromegaly or thyroid tenderness.     Vascular: No carotid bruit.     Trachea: Trachea normal.  Cardiovascular:     Rate and Rhythm: Normal rate and regular rhythm.     Pulses: Normal pulses.          Carotid pulses are 2+ on the right side and 2+ on the left side.      Radial pulses are 2+ on the right side and 2+ on the left side.       Dorsalis pedis pulses are 2+ on the right side and 2+ on the left side.       Posterior tibial pulses are 2+ on the right side and 2+ on the left side.     Heart sounds: Normal heart sounds, S1 normal and S2 normal. No murmur heard.   No friction rub. No gallop.  Pulmonary:     Effort: Pulmonary effort is normal. No respiratory distress.     Breath sounds: Normal breath sounds and air entry. No stridor. No wheezing, rhonchi or rales.  Chest:     Chest wall: No tenderness.     Comments: Breasts: breasts appear normal, no suspicious masses, no skin or nipple changes or axillary nodes, right breast normal without mass, skin or nipple changes or axillary nodes, left breast normal without mass, skin or nipple changes or axillary nodes, unchanged from previous exams, risk and benefit of breast self-exam was discussed  Abdominal:     General: Abdomen is flat. Bowel sounds are normal. There is no distension.     Palpations: Abdomen is soft. There is no mass.     Tenderness: There is no abdominal tenderness. There is no right CVA tenderness, left CVA tenderness, guarding or rebound.     Hernia: No hernia is present.  Genitourinary:    General: Normal vulva.     Exam position: Lithotomy position.     Tanner stage (genital): 5.     Urethra: Prolapse present.     Vagina: Normal.     Cervix: Normal.     Uterus: Normal.       Adnexa: Right adnexa normal and left adnexa normal.  Musculoskeletal:        General: Swelling and tenderness present. No deformity or signs of injury. Normal range of motion.     Cervical back: Full passive range of motion without pain, normal range of motion and neck supple. No edema, rigidity or tenderness. No muscular tenderness.     Left lower leg: No edema.     Comments: C/o pain in Bilateral knees; visible joint swelling  Lymphadenopathy:     Cervical: No cervical adenopathy.     Right cervical: No superficial, deep or posterior cervical adenopathy.    Left cervical: No superficial, deep or posterior cervical adenopathy.  Skin:    General: Skin is warm and dry.     Capillary Refill: Capillary refill takes less than 2 seconds.     Coloration: Skin is not jaundiced or pale.     Findings: No bruising, erythema, lesion or rash.  Neurological:     General: No focal deficit present.     Mental Status: She is alert and oriented to person, place, and time. Mental status is at baseline.     GCS: GCS eye subscore is 4. GCS verbal subscore is 5. GCS motor subscore is 6.     Sensory: Sensation is intact. No sensory deficit.     Motor: Motor function  is intact. No weakness.     Coordination: Coordination is intact. Coordination normal.     Gait: Gait is intact. Gait normal.  Psychiatric:        Attention and Perception: Attention and perception normal.        Mood and Affect: Mood and affect normal.        Speech: Speech normal.        Behavior: Behavior normal. Behavior is cooperative.        Thought Content: Thought content normal.        Cognition and Memory: Cognition and memory normal.        Judgment: Judgment normal.     Last depression screening scores PHQ 2/9 Scores 09/15/2020 05/24/2020 05/22/2019  PHQ - 2 Score 3 0 0  PHQ- 9 Score 13 6 1   Exception Documentation - - -   Last fall risk screening Fall Risk  05/24/2020  Falls in the past year? 0  Number falls in past yr: 0   Injury with Fall? 0  Risk for fall due to : No Fall Risks  Follow up Falls evaluation completed   Last Audit-C alcohol use screening Alcohol Use Disorder Test (AUDIT) 05/24/2020  1. How often do you have a drink containing alcohol? 0  2. How many drinks containing alcohol do you have on a typical day when you are drinking? 0  3. How often do you have six or more drinks on one occasion? 0  AUDIT-C Score 0  4. How often during the last year have you found that you were not able to stop drinking once you had started? -  5. How often during the last year have you failed to do what was normally expected from you because of drinking? -  6. How often during the last year have you needed a first drink in the morning to get yourself going after a heavy drinking session? -  7. How often during the last year have you had a feeling of guilt of remorse after drinking? -  8. How often during the last year have you been unable to remember what happened the night before because you had been drinking? -  9. Have you or someone else been injured as a result of your drinking? -  10. Has a relative or friend or a doctor or another health worker been concerned about your drinking or suggested you cut down? -  Alcohol Use Disorder Identification Test Final Score (AUDIT) -  Alcohol Brief Interventions/Follow-up AUDIT Score <7 follow-up not indicated   A score of 3 or more in women, and 4 or more in men indicates increased risk for alcohol abuse, EXCEPT if all of the points are from question 1   No results found for any visits on 06/01/21.  Assessment & Plan    Routine Health Maintenance and Physical Exam  Exercise Activities and Dietary recommendations  Goals   None     Immunization History  Administered Date(s) Administered   Influenza,inj,Quad PF,6+ Mos 03/27/2019, 06/01/2021   Influenza-Unspecified 04/02/2020   Pneumococcal Polysaccharide-23 05/03/2005   Td 08/04/2002, 05/24/2020   Tdap  01/27/2010    Health Maintenance  Topic Date Due   COVID-19 Vaccine (1) Never done   Zoster Vaccines- Shingrix (1 of 2) Never done   Pneumococcal Vaccine 37-80 Years old (2 - PCV) 05/03/2006   MAMMOGRAM  02/10/2021   PAP SMEAR-Modifier  05/25/2023   COLONOSCOPY (Pts 45-67yrs Insurance coverage will need to be confirmed)  08/30/2023   TETANUS/TDAP  05/24/2030   INFLUENZA VACCINE  Completed   Hepatitis C Screening  Completed   HIV Screening  Completed   HPV VACCINES  Aged Out    Discussed health benefits of physical activity, and encouraged her to engage in regular exercise appropriate for her age and condition.  Problem List Items Addressed This Visit       Respiratory   Asthma    Off advair for years; no longer smokes Realized that PRN inhaler is out of date Request for refill      Relevant Medications   albuterol (VENTOLIN HFA) 108 (90 Base) MCG/ACT inhaler     Other   Annual physical exam - Primary    Due for eye exam UTD on dental Things to do to keep yourself healthy  - Exercise at least 30-45 minutes a day, 3-4 days a week.  - Eat a low-fat diet with lots of fruits and vegetables, up to 7-9 servings per day.  - Seatbelts can save your life. Wear them always.  - Smoke detectors on every level of your home, check batteries every year.  - Eye Doctor - have an eye exam every 1-2 years  - Safe sex - if you may be exposed to STDs, use a condom.  - Alcohol -  If you drink, do it moderately, less than 2 drinks per day.  - [place]. Choose someone to speak for you if you are not able.  - Depression is common in our stressful world.If you're feeling down or losing interest in things you normally enjoy, please come in for a visit.  - Violence - If anyone is threatening or hurting you, please call immediately.        Screening for cervical cancer    Denies STI testing PAP performed      Relevant Orders   Cytology - PAP   Elevated LDL  cholesterol level    Lipid screening      Relevant Orders   Lipid panel   Screening for diabetes mellitus    A1c screening; BMI 40      Relevant Orders   Hemoglobin A1c   Elevated BUN    Repeat chemistry      Relevant Orders   CBC with Differential/Platelet   Encounter for screening mammogram for malignant neoplasm of breast    Screening mammogram      Need for influenza vaccination    Consent reviewed; provided      Relevant Orders   Flu Vaccine QUAD 15mo+IM (Fluarix, Fluzone & Alfiuria Quad PF) (Completed)     Return in about 1 year (around 06/01/2022) for annual examination.     Vonna Kotyk, FNP, have reviewed all documentation for this visit. The documentation on 06/01/21 for the exam, diagnosis, procedures, and orders are all accurate and complete.    [name], [place] [phone] (phone) [phone] (fax)  [place]

## 2021-06-01 NOTE — Assessment & Plan Note (Signed)
Lipid screening

## 2021-06-01 NOTE — Assessment & Plan Note (Signed)
Repeat chemistry

## 2021-06-01 NOTE — Assessment & Plan Note (Signed)
Screening mammogram.

## 2021-06-01 NOTE — Assessment & Plan Note (Signed)
Off advair for years; no longer smokes Realized that PRN inhaler is out of date Request for refill

## 2021-06-01 NOTE — Assessment & Plan Note (Signed)
Denies STI testing PAP performed

## 2021-06-09 LAB — CYTOLOGY - PAP: Diagnosis: NEGATIVE

## 2021-06-21 ENCOUNTER — Other Ambulatory Visit: Payer: Self-pay | Admitting: Family Medicine

## 2021-06-21 DIAGNOSIS — B009 Herpesviral infection, unspecified: Secondary | ICD-10-CM

## 2021-06-30 ENCOUNTER — Other Ambulatory Visit: Payer: Self-pay | Admitting: Family Medicine

## 2021-06-30 DIAGNOSIS — G479 Sleep disorder, unspecified: Secondary | ICD-10-CM

## 2021-06-30 MED ORDER — TRAZODONE HCL 50 MG PO TABS: 25.0000 mg | ORAL_TABLET | Freq: Every evening | ORAL | 1 refills | Status: DC | PRN

## 2021-06-30 NOTE — Telephone Encounter (Unsigned)
Copied from [place] [id number]. Topic: Quick Communication - Rx Refill/Question >> Jun 30, 2021  8:44 AM Tessa Lerner A wrote: Medication: traZODone (DESYREL) 50 MG tablet [[id number]]  Has the patient contacted their pharmacy? Yes.  The patient has been directed to contact their PCP and request (specifically a 90 day supply of the medication) The patient has stressed the urgency of their request   (Agent: If no, request that the patient contact the pharmacy for the refill. If patient does not wish to contact the pharmacy document the reason why and proceed with request.) (Agent: If yes, when and what did the pharmacy advise?)  Preferred Pharmacy (with phone number or street name): [facility] [place], [place] AT [place]  Phone:  [phone] Fax:  [phone]   Has the patient been seen for an appointment in the last year OR does the patient have an upcoming appointment? Yes.    Agent: Please be advised that RX refills may take up to 3 business days. We ask that you follow-up with your pharmacy.

## 2021-06-30 NOTE — Telephone Encounter (Signed)
Requested Prescriptions  Pending Prescriptions Disp Refills   traZODone (DESYREL) 50 MG tablet 90 tablet 1    Sig: Take 0.5-2 tablets (25-100 mg total) by mouth at bedtime as needed for sleep.     Psychiatry: Antidepressants - Serotonin Modulator Passed - 06/30/2021  9:40 AM      Passed - Valid encounter within last 6 months    Recent Outpatient Visits          4 weeks ago Annual physical exam   [facility] [place] [name], FNP   4 months ago Chronic pain of both knees   [place], PA-C   9 months ago Difficulty sleeping   [facility] [place], Cle[place] Sorrel, [place]   1 year ago Annual physical exam   [place], Cle[place] Sorrel, [place]   1 year ago Epigastric pain   [place], [place] Sorrel, [place]      Future Appointments            In 10 months Ralene Bathe, MD [place]

## 2021-06-30 NOTE — Telephone Encounter (Signed)
Rx filled on 06/02/2021 #90 with 0 refills. Pt should still have a 60 day supply. Requested Prescriptions  Pending Prescriptions Disp Refills   rOPINIRole (REQUIP) 0.5 MG tablet [Pharmacy Med Name: ROPINIROLE 0.5MG  TABLETS] 90 tablet 0    Sig: TAKE 1 TABLET(0.5 MG) BY MOUTH AT BEDTIME     Neurology:  Parkinsonian Agents Passed - 06/30/2021  3:13 AM      Passed - Last BP in normal range    BP Readings from Last 1 Encounters:  06/01/21 128/77         Passed - Valid encounter within last 12 months    Recent Outpatient Visits          4 weeks ago Annual physical exam   [facility] [name], FNP   4 months ago Chronic pain of both knees   [place], PA-C   9 months ago Difficulty sleeping   [facility] [place], Clearn[place] Sorrel, [place]   1 year ago Annual physical exam   [place], Clearnce Sorrel, [place]   1 year ago Epigastric pain   [place], Clearnce Sorrel, [place]      Future Appointments            In 10 months Ralene Bathe, MD [place]

## 2021-07-12 ENCOUNTER — Ambulatory Visit
Admission: RE | Admit: 2021-07-12 | Discharge: 2021-07-12 | Disposition: A | Discharge status: Home / Self Care | Payer: Managed Care, Other (non HMO) | Source: Ambulatory Visit | Attending: Family Medicine | Admitting: Family Medicine

## 2021-07-12 ENCOUNTER — Other Ambulatory Visit: Payer: Self-pay

## 2021-07-12 DIAGNOSIS — Z1231 Encounter for screening mammogram for malignant neoplasm of breast: Secondary | ICD-10-CM | POA: Diagnosis not present

## 2021-07-30 ENCOUNTER — Telehealth: Payer: Self-pay | Admitting: Family Medicine

## 2021-07-30 DIAGNOSIS — I10 Essential (primary) hypertension: Secondary | ICD-10-CM

## 2021-07-31 NOTE — Telephone Encounter (Signed)
Last refill 05/04/21. Due 08/04/20. No future visit . Last labs 09/15/20. Requested Prescriptions  Refused Prescriptions Disp Refills   losartan (COZAAR) 100 MG tablet [Pharmacy Med Name: LOSARTAN 100MG  TABLETS] 90 tablet 0    Sig: TAKE 1 TABLET(100 MG) BY MOUTH DAILY     Cardiovascular:  Angiotensin Receptor Blockers Failed - 07/30/2021  6:14 PM      Failed - Cr in normal range and within 180 days    Creatinine, Ser  Date Value Ref Range Status  09/15/2020 0.66 0.57 - 1.00 mg/dL Final         Failed - K in normal range and within 180 days    Potassium  Date Value Ref Range Status  09/15/2020 4.8 3.5 - 5.2 mmol/L Final         Passed - Patient is not pregnant      Passed - Last BP in normal range    BP Readings from Last 1 Encounters:  06/01/21 128/77         Passed - Valid encounter within last 6 months    Recent Outpatient Visits          2 months ago Annual physical exam   [facility] [name], FNP   5 months ago Chronic pain of both knees   [place], PA-C   10 months ago Difficulty sleeping   [facility] [place], Clearn[place] Sorrel, [place]   1 year ago Annual physical exam   [place], Clearnce Sorrel, [place]   1 year ago Epigastric pain   [place], Clearnce Sorrel, [place]      Future Appointments            In 9 months Ralene Bathe, MD [place]

## 2021-08-01 NOTE — Telephone Encounter (Signed)
Pt was calling to follow up on this refill request, pt wanted a call back, please advise. Pt also wanted a 90 day supply.

## 2021-08-02 MED ORDER — LOSARTAN POTASSIUM 100 MG PO TABS: ORAL_TABLET | ORAL | 1 refills | Status: DC

## 2021-08-02 NOTE — Telephone Encounter (Signed)
Refill has been sent to pharmacy. KW

## 2021-10-12 ENCOUNTER — Other Ambulatory Visit: Payer: Self-pay | Admitting: Student

## 2021-10-12 DIAGNOSIS — M19012 Primary osteoarthritis, left shoulder: Secondary | ICD-10-CM

## 2021-10-21 ENCOUNTER — Ambulatory Visit
Admission: RE | Admit: 2021-10-21 | Discharge: 2021-10-21 | Disposition: A | Discharge status: Home / Self Care | Payer: Managed Care, Other (non HMO) | Source: Ambulatory Visit | Attending: Student | Admitting: Student

## 2021-10-21 DIAGNOSIS — M19012 Primary osteoarthritis, left shoulder: Secondary | ICD-10-CM

## 2021-10-31 ENCOUNTER — Telehealth: Payer: Self-pay | Admitting: Family Medicine

## 2021-10-31 DIAGNOSIS — I10 Essential (primary) hypertension: Secondary | ICD-10-CM

## 2021-10-31 NOTE — Telephone Encounter (Signed)
Medication Refill - Medication: amLODipine (NORVASC) 10 MG tablet ? ?Has the patient contacted their pharmacy? Yes.   Pt told to contact provider ? ?Preferred Pharmacy (with phone number or street name):  ?[facility] [place], [place] AT [facility] [address] [place] Phone:  [phone]  ?Fax:  [phone]  ?  ? ?Has the patient been seen for an appointment in the last year OR does the patient have an upcoming appointment? Yes.   ? ?Agent: Please be advised that RX refills may take up to 3 business days. We ask that you follow-up with your pharmacy. ? ?

## 2021-11-01 ENCOUNTER — Other Ambulatory Visit: Payer: Self-pay | Admitting: *Deleted

## 2021-11-01 DIAGNOSIS — I10 Essential (primary) hypertension: Secondary | ICD-10-CM

## 2021-11-01 MED ORDER — AMLODIPINE BESYLATE 10 MG PO TABS: ORAL_TABLET | ORAL | 0 refills | Status: DC

## 2021-11-01 MED ORDER — AMLODIPINE BESYLATE 10 MG PO TABS: ORAL_TABLET | ORAL | 3 refills | Status: DC

## 2021-11-01 NOTE — Telephone Encounter (Signed)
Patient called in and stated that she is in need of a 90 day supply amLODipine (NORVASC) 10 MG tablet or else her insurance will not cover it. Please advise per patient she only have few tabs left and can not afford to be without her medication. Please call with questions to  Ph# [phone] ?

## 2021-11-01 NOTE — Telephone Encounter (Signed)
Requested Prescriptions  ?Pending Prescriptions Disp Refills  ?? amLODipine (NORVASC) 10 MG tablet 30 tablet 0  ?  Sig: TAKE 1 TABLET(10 MG) BY MOUTH DAILY  ?  ? Cardiovascular: Calcium Channel Blockers 2 Passed - 10/31/2021  2:08 PM  ?  ?  Passed - Last BP in normal range  ?  BP Readings from Last 1 Encounters:  ?06/01/21 128/77  ?   ?  ?  Passed - Last Heart Rate in normal range  ?  Pulse Readings from Last 1 Encounters:  ?06/01/21 77  ?   ?  ?  Passed - Valid encounter within last 6 months  ?  Recent Outpatient Visits   ?      ? 5 months ago Annual physical exam  ? [facility] [name], FNP  ? 8 months ago Chronic pain of both knees  ? [place], PA-C  ? 1 year ago Difficulty sleeping  ? [facility] [place], Clearn[place] Sorrel, [place]  ? 1 year ago Annual physical exam  ? [place], [place]  ? 1 year ago Epigastric pain  ? [place], [place]  ?  ?  ?Future Appointments   ?        ? In 6 months Ralene Bathe, MD [place]  ?  ? ?  ?  ?  ? ? ?

## 2021-12-26 ENCOUNTER — Telehealth: Payer: Self-pay | Admitting: Family Medicine

## 2021-12-29 NOTE — Progress Notes (Unsigned)
Established patient visit   Patient: Rita Foster   DOB: 11/12/1960   61 y.o. Female  MRN: [id number] Visit Date: 01/04/2022  Today's healthcare provider: [name], FNP  Introduced to nurse practitioner role and practice setting.  All questions answered.  Discussed provider/patient relationship and expectations.   I,Reegan Bouffard J Oliviarose Punch,acting as a scribe for [name], FNP.,have documented all relevant documentation on the behalf of [name], FNP,as directed by  [name], FNP while in the presence of [name], FNP.    Chief Complaint  Patient presents with   Hypertension   Subjective    HPI  Hypertension, follow-up  BP Readings from Last 3 Encounters:  01/04/22 121/78  06/01/21 128/77  02/28/21 (!) 144/86   Wt Readings from Last 3 Encounters:  01/04/22 260 lb (117.9 kg)  06/01/21 263 lb 3.2 oz (119.4 kg)  02/28/21 265 lb (120.2 kg)     She was last seen for hypertension 8 months ago.  BP at that visit was 128/77. Management since that visit includes continue medication.  She reports excellent compliance with treatment. She is not having side effects.  She is following a  well balanced  diet. She is not exercising. She does not smoke.  Use of agents associated with hypertension: NSAIDS.   Outside blood pressures are not checked. Symptoms: No chest pain No chest pressure  No palpitations No syncope  No dyspnea No orthopnea  No paroxysmal nocturnal dyspnea Yes lower extremity edema   Pertinent labs Lab Results  Component Value Date   CHOL 217 (H) 09/15/2020   HDL 62 09/15/2020   LDLCALC 131 (H) 09/15/2020   TRIG 136 09/15/2020   Lab Results  Component Value Date   NA 140 09/15/2020   K 4.8 09/15/2020   CREATININE 0.66 09/15/2020   EGFR 100 09/15/2020   GLUCOSE 87 09/15/2020   TSH 1.960 09/15/2020     The 10-year ASCVD risk score (Arnett DK, et al., 2019) is:  4.3%  ---------------------------------------------------------------------------------------------------   Medications: Outpatient Medications Prior to Visit  Medication Sig   acetaminophen (TYLENOL) 500 MG tablet Take 650 mg by mouth at bedtime as needed for mild pain or moderate pain.    albuterol (VENTOLIN HFA) 108 (90 Base) MCG/ACT inhaler Inhale 2 puffs into the lungs every 6 (six) hours as needed for wheezing or shortness of breath.   amLODipine (NORVASC) 10 MG tablet TAKE 1 TABLET(10 MG) BY MOUTH DAILY   Biotin w/ Vitamins C & E (HAIR SKIN & NAILS GUMMIES PO) Take 1 tablet by mouth daily.   Blood Pressure Monitoring (BLOOD PRESSURE MONITOR/WRIST) DEVI To check bp daily   diclofenac (VOLTAREN) 50 MG EC tablet Take 50 mg by mouth 2 (two) times daily.   diclofenac Sodium (VOLTAREN) 1 % GEL Apply 2 g topically in the morning and at bedtime.   Misc Natural Products (OSTEO BI-FLEX JOINT SHIELD) TABS Take 2 tablets by mouth daily at 12 noon.    Multiple Vitamin tablet Take 1 tablet by mouth daily.    Probiotic Product (PROBIOTIC PO) Take by mouth.   Turmeric 500 MG CAPS Take 1,000 mg by mouth daily at 12 noon.   [DISCONTINUED] acyclovir (ZOVIRAX) 400 MG tablet TAKE 1 TABLET(400 MG) BY MOUTH TWICE DAILY   [DISCONTINUED] DULoxetine (CYMBALTA) 60 MG capsule Take 1 capsule (60 mg total) by mouth daily.   [DISCONTINUED] losartan (COZAAR) 100 MG tablet TAKE 1 TABLET(100 MG) BY MOUTH DAILY   [  DISCONTINUED] rOPINIRole (REQUIP) 0.5 MG tablet TAKE 1 TABLET(0.5 MG) BY MOUTH AT BEDTIME   [DISCONTINUED] traZODone (DESYREL) 50 MG tablet Take 0.5-2 tablets (25-100 mg total) by mouth at bedtime as needed for sleep.   No facility-administered medications prior to visit.    Review of Systems  Last CBC Lab Results  Component Value Date   WBC 10.5 09/15/2020   HGB 13.5 09/15/2020   HCT 41.6 09/15/2020   MCV 88 09/15/2020   MCH 28.7 09/15/2020   RDW 14.6 09/15/2020   PLT 357 85/88/5027   Last  metabolic panel Lab Results  Component Value Date   GLUCOSE 87 09/15/2020   NA 140 09/15/2020   K 4.8 09/15/2020   CL 100 09/15/2020   CO2 24 09/15/2020   BUN 17 09/15/2020   CREATININE 0.66 09/15/2020   EGFR 100 09/15/2020   CALCIUM 10.0 09/15/2020   PROT 7.4 09/15/2020   ALBUMIN 4.6 09/15/2020   LABGLOB 2.8 09/15/2020   AGRATIO 1.6 09/15/2020   BILITOT 0.3 09/15/2020   ALKPHOS 91 09/15/2020   AST 23 09/15/2020   ALT 19 09/15/2020   ANIONGAP 6 03/28/2019   Last lipids Lab Results  Component Value Date   CHOL 217 (H) 09/15/2020   HDL 62 09/15/2020   LDLCALC 131 (H) 09/15/2020   TRIG 136 09/15/2020   Last hemoglobin A1c Lab Results  Component Value Date   HGBA1C 5.6 09/15/2020   Last thyroid functions Lab Results  Component Value Date   TSH 1.960 09/15/2020       Objective    BP 121/78 (BP Location: Right Arm, Patient Position: Sitting, Cuff Size: Large)   Pulse 69   Temp 98.4 F (36.9 C) (Oral)   Resp 16   Ht $R'5\' 8"'OD$  (1.727 m)   Wt 260 lb (117.9 kg)   SpO2 99%   BMI 39.53 kg/m  BP Readings from Last 3 Encounters:  01/04/22 121/78  06/01/21 128/77  02/28/21 (!) 144/86   Wt Readings from Last 3 Encounters:  01/04/22 260 lb (117.9 kg)  06/01/21 263 lb 3.2 oz (119.4 kg)  02/28/21 265 lb (120.2 kg)   SpO2 Readings from Last 3 Encounters:  01/04/22 99%  06/01/21 99%  02/28/21 98%      Physical Exam Vitals and nursing note reviewed.  Constitutional:      General: She is not in acute distress.    Appearance: Normal appearance. She is obese. She is not ill-appearing, toxic-appearing or diaphoretic.  HENT:     Head: Normocephalic and atraumatic.  Cardiovascular:     Rate and Rhythm: Normal rate and regular rhythm.     Pulses: Normal pulses.     Heart sounds: Normal heart sounds. No murmur heard.    No friction rub. No gallop.  Pulmonary:     Effort: Pulmonary effort is normal. No respiratory distress.     Breath sounds: Normal breath sounds. No  stridor. No wheezing, rhonchi or rales.  Chest:     Chest wall: No tenderness.  Abdominal:     General: Bowel sounds are normal.     Palpations: Abdomen is soft.  Musculoskeletal:        General: Tenderness present. No swelling, deformity or signs of injury. Normal range of motion.     Right lower leg: Edema present.     Left lower leg: Edema present.     Comments: Ortho brace in place on L ankle; pushing fluid up into calf  Skin:    General:  Skin is warm and dry.     Capillary Refill: Capillary refill takes less than 2 seconds.     Coloration: Skin is not jaundiced or pale.     Findings: No bruising, erythema, lesion or rash.  Neurological:     General: No focal deficit present.     Mental Status: She is alert and oriented to person, place, and time. Mental status is at baseline.     Cranial Nerves: No cranial nerve deficit.     Sensory: No sensory deficit.     Motor: No weakness.     Coordination: Coordination normal.  Psychiatric:        Mood and Affect: Mood normal.        Behavior: Behavior normal.        Thought Content: Thought content normal.        Judgment: Judgment normal.       No results found for any visits on 01/04/22.  Assessment & Plan     Problem List Items Addressed This Visit       Cardiovascular and Mediastinum   Essential hypertension    Chronic, stable Denies CP Denies SOB/ DOE Denies low blood pressure/hypotension Denies vision changes No LE Edema noted on exam Continue medication, Norvasc 10 mg, Losartan 100 mg  Denies side effects Return for CPE after 06/01/22 Seek emergent care if you develop chest pain or chest pressure       Relevant Medications   losartan (COZAAR) 100 MG tablet   Other Relevant Orders   Comprehensive Metabolic Panel (CMET)   HgB A1c     Other   Borderline diabetic    Last A1c of 5.6% Repeat A1c for work screening, obesity, HTN, HLD      Relevant Orders   HgB A1c   Difficulty sleeping    Chronic,  stable Continue trazodone at 50 mg to assist       Relevant Medications   traZODone (DESYREL) 50 MG tablet   Herpes    Chronic, stable Wishes to continue daily anti viral for suppression       Relevant Medications   acyclovir (ZOVIRAX) 400 MG tablet   Hypercholesteremia    Chronic, repeat FLP recommend diet low in saturated fat and regular exercise - 30 min at least 5 times per week Previously elevated with LDL at 131 with total of 217       Relevant Medications   losartan (COZAAR) 100 MG tablet   Other Relevant Orders   Lipid panel   Morbid obesity ([place]) - Primary    Chronic, stable Associated with joint instability/pain, HTN, HLD, asthma      Relevant Orders   Comprehensive Metabolic Panel (CMET)   CBC with Differential/Platelet   Lipid panel   TSH + free T4   HgB A1c   Obese    Chronic, stable Body mass index is 39.53 kg/m. Discussed importance of healthy weight management Discussed diet and exercise       Relevant Orders   Comprehensive Metabolic Panel (CMET)   CBC with Differential/Platelet   Lipid panel   TSH + free T4   HgB A1c     Return in about 5 months (around 06/06/2022) for annual examination.      Vonna Kotyk, FNP, have reviewed all documentation for this visit. The documentation on 01/04/22 for the exam, diagnosis, procedures, and orders are all accurate and complete.    [name], [place] [phone] (phone) [phone] (  fax)  [place]

## 2022-01-04 ENCOUNTER — Ambulatory Visit: Payer: Managed Care, Other (non HMO) | Admitting: Family Medicine

## 2022-01-04 ENCOUNTER — Encounter: Payer: Self-pay | Admitting: Family Medicine

## 2022-01-04 DIAGNOSIS — Z6839 Body mass index (BMI) 39.0-39.9, adult: Secondary | ICD-10-CM

## 2022-01-04 DIAGNOSIS — I10 Essential (primary) hypertension: Secondary | ICD-10-CM | POA: Insufficient documentation

## 2022-01-04 DIAGNOSIS — R7303 Prediabetes: Secondary | ICD-10-CM | POA: Diagnosis not present

## 2022-01-04 DIAGNOSIS — E7800 Pure hypercholesterolemia, unspecified: Secondary | ICD-10-CM | POA: Diagnosis not present

## 2022-01-04 DIAGNOSIS — G479 Sleep disorder, unspecified: Secondary | ICD-10-CM | POA: Insufficient documentation

## 2022-01-04 DIAGNOSIS — B009 Herpesviral infection, unspecified: Secondary | ICD-10-CM

## 2022-01-04 MED ORDER — ACYCLOVIR 400 MG PO TABS: ORAL_TABLET | ORAL | 1 refills | Status: DC

## 2022-01-04 MED ORDER — LOSARTAN POTASSIUM 100 MG PO TABS: ORAL_TABLET | ORAL | 1 refills | Status: DC

## 2022-01-04 MED ORDER — TRAZODONE HCL 50 MG PO TABS: 50.0000 mg | ORAL_TABLET | Freq: Every evening | ORAL | 1 refills | Status: DC | PRN

## 2022-01-04 NOTE — Assessment & Plan Note (Signed)
Chronic, stable Denies CP Denies SOB/ DOE Denies low blood pressure/hypotension Denies vision changes No LE Edema noted on exam Continue medication, Norvasc 10 mg, Losartan 100 mg  Denies side effects Return for CPE after 06/01/22 Seek emergent care if you develop chest pain or chest pressure

## 2022-01-04 NOTE — Assessment & Plan Note (Signed)
Chronic, repeat FLP recommend diet low in saturated fat and regular exercise - 30 min at least 5 times per week Previously elevated with LDL at 131 with total of 217

## 2022-01-04 NOTE — Assessment & Plan Note (Signed)
Chronic, stable Continue trazodone at 50 mg to assist

## 2022-01-04 NOTE — Assessment & Plan Note (Signed)
Chronic, stable Body mass index is 39.53 kg/m. Discussed importance of healthy weight management Discussed diet and exercise

## 2022-01-04 NOTE — Assessment & Plan Note (Signed)
Last A1c of 5.6% Repeat A1c for work screening, obesity, HTN, HLD

## 2022-01-04 NOTE — Assessment & Plan Note (Signed)
Chronic, stable Associated with joint instability/pain, HTN, HLD, asthma

## 2022-01-04 NOTE — Assessment & Plan Note (Signed)
Chronic, stable Wishes to continue daily anti viral for suppression

## 2022-01-05 LAB — COMPREHENSIVE METABOLIC PANEL
ALT: 14 IU/L (ref 0–32)
AST: 20 IU/L (ref 0–40)
Albumin/Globulin Ratio: 1.7 (ref 1.2–2.2)
Albumin: 4.2 g/dL (ref 3.8–4.8)
Alkaline Phosphatase: 72 IU/L (ref 44–121)
BUN/Creatinine Ratio: 24 (ref 12–28)
BUN: 16 mg/dL (ref 8–27)
Bilirubin Total: 0.4 mg/dL (ref 0.0–1.2)
CO2: 24 mmol/L (ref 20–29)
Calcium: 9.8 mg/dL (ref 8.7–10.3)
Chloride: 101 mmol/L (ref 96–106)
Creatinine, Ser: 0.67 mg/dL (ref 0.57–1.00)
Globulin, Total: 2.5 g/dL (ref 1.5–4.5)
Glucose: 81 mg/dL (ref 70–99)
Potassium: 4.5 mmol/L (ref 3.5–5.2)
Sodium: 138 mmol/L (ref 134–144)
Total Protein: 6.7 g/dL (ref 6.0–8.5)
eGFR: 99 mL/min/{1.73_m2} (ref >59)

## 2022-01-05 LAB — CBC WITH DIFFERENTIAL/PLATELET
Basophils Absolute: 0 10*3/uL (ref 0.0–0.2)
Basos: 0 %
EOS (ABSOLUTE): 0.2 10*3/uL (ref 0.0–0.4)
Eos: 2 %
Hematocrit: 40.2 % (ref 34.0–46.6)
Hemoglobin: 13.2 g/dL (ref 11.1–15.9)
Immature Grans (Abs): 0 10*3/uL (ref 0.0–0.1)
Immature Granulocytes: 0 %
Lymphocytes Absolute: 2.4 10*3/uL (ref 0.7–3.1)
Lymphs: 24 %
MCH: 29.9 pg (ref 26.6–33.0)
MCHC: 32.8 g/dL (ref 31.5–35.7)
MCV: 91 fL (ref 79–97)
Monocytes Absolute: 0.8 10*3/uL (ref 0.1–0.9)
Monocytes: 8 %
Neutrophils Absolute: 6.5 10*3/uL (ref 1.4–7.0)
Neutrophils: 66 %
Platelets: 312 10*3/uL (ref 150–450)
RBC: 4.42 x10E6/uL (ref 3.77–5.28)
RDW: 12.9 % (ref 11.7–15.4)
WBC: 9.9 10*3/uL (ref 3.4–10.8)

## 2022-01-05 LAB — TSH+FREE T4
Free T4: 1.43 ng/dL (ref 0.82–1.77)
TSH: 1.39 u[IU]/mL (ref 0.450–4.500)

## 2022-01-05 LAB — LIPID PANEL
Chol/HDL Ratio: 3.1 ratio (ref 0.0–4.4)
Cholesterol, Total: 199 mg/dL (ref 100–199)
HDL: 65 mg/dL (ref >39)
LDL Chol Calc (NIH): 121 mg/dL — ABNORMAL HIGH (ref 0–99)
Triglycerides: 69 mg/dL (ref 0–149)
VLDL Cholesterol Cal: 13 mg/dL (ref 5–40)

## 2022-01-05 LAB — HEMOGLOBIN A1C
Est. average glucose Bld gHb Est-mCnc: 123 mg/dL
Hgb A1c MFr Bld: 5.9 % — ABNORMAL HIGH (ref 4.8–5.6)

## 2022-01-05 NOTE — Progress Notes (Signed)
Cholesterol is improved; however, LDL remains above goal of <100. I recommend diet low in saturated fat and regular exercise - 30 min at least 5 times per week  A1c is elevated; back in pre-diabetic range. Continue to recommend balanced, lower carb meals. Smaller meal size, adding snacks. Choosing water as drink of choice and increasing purposeful exercise.  Normal stable blood chemistry. Normal stable blood counts. Normal stable thyroid.  Form needs your signature prior to scan; unable to complete fax at this time without signature. Please come to office at your convenience.  [name], [address] [place], [place] [address] [phone] (phone) [phone] (fax) [place]

## 2022-02-20 ENCOUNTER — Other Ambulatory Visit: Payer: Self-pay | Admitting: Family Medicine

## 2022-02-20 DIAGNOSIS — I10 Essential (primary) hypertension: Secondary | ICD-10-CM

## 2022-02-26 DIAGNOSIS — M1711 Unilateral primary osteoarthritis, right knee: Secondary | ICD-10-CM | POA: Insufficient documentation

## 2022-02-26 DIAGNOSIS — M1712 Unilateral primary osteoarthritis, left knee: Secondary | ICD-10-CM | POA: Insufficient documentation

## 2022-02-26 DIAGNOSIS — M8700 Idiopathic aseptic necrosis of unspecified bone: Secondary | ICD-10-CM | POA: Insufficient documentation

## 2022-04-07 ENCOUNTER — Ambulatory Visit: Payer: Self-pay

## 2022-04-07 NOTE — Telephone Encounter (Signed)
Summary: Flu Shot advice   Pt is calling to ask is it ok to wait until 12/23 when her next scheduled appt is to wait to receive her flu shot? Please advise     Called pt and advised the CDC does recommend to get flu shot in Sept or October. Advised that the flu shot can be given through season, but it was good to get vaccine early in the season. Appt made. Reason for Disposition . General information question, no triage required and triager able to answer question  Answer Assessment - Initial Assessment Questions 1. REASON FOR CALL or QUESTION: "What is your reason for calling today?" or "How can I best help you?" or "What question do you have that I can help answer?"     Summary: Flu Shot advice   Pt is calling to ask is it ok to wait until 12/23 when her next scheduled appt is to wait to receive her flu shot? Please advise  Protocols used: Information Only Call - No Triage-A-AH

## 2022-04-08 ENCOUNTER — Other Ambulatory Visit: Payer: Self-pay | Admitting: Family Medicine

## 2022-04-08 DIAGNOSIS — G479 Sleep disorder, unspecified: Secondary | ICD-10-CM

## 2022-05-03 ENCOUNTER — Ambulatory Visit (INDEPENDENT_AMBULATORY_CARE_PROVIDER_SITE_OTHER): Payer: Managed Care, Other (non HMO)

## 2022-05-03 DIAGNOSIS — Z23 Encounter for immunization: Secondary | ICD-10-CM | POA: Diagnosis not present

## 2022-05-10 ENCOUNTER — Ambulatory Visit: Payer: Managed Care, Other (non HMO) | Admitting: Dermatology

## 2022-05-10 DIAGNOSIS — L853 Xerosis cutis: Secondary | ICD-10-CM

## 2022-05-10 DIAGNOSIS — L709 Acne, unspecified: Secondary | ICD-10-CM

## 2022-05-10 DIAGNOSIS — C44311 Basal cell carcinoma of skin of nose: Secondary | ICD-10-CM | POA: Diagnosis not present

## 2022-05-10 DIAGNOSIS — B079 Viral wart, unspecified: Secondary | ICD-10-CM

## 2022-05-10 DIAGNOSIS — Z1283 Encounter for screening for malignant neoplasm of skin: Secondary | ICD-10-CM

## 2022-05-10 DIAGNOSIS — D3611 Benign neoplasm of peripheral nerves and autonomic nervous system of face, head, and neck: Secondary | ICD-10-CM

## 2022-05-10 DIAGNOSIS — C4491 Basal cell carcinoma of skin, unspecified: Secondary | ICD-10-CM

## 2022-05-10 DIAGNOSIS — D229 Melanocytic nevi, unspecified: Secondary | ICD-10-CM

## 2022-05-10 DIAGNOSIS — L570 Actinic keratosis: Secondary | ICD-10-CM

## 2022-05-10 DIAGNOSIS — D369 Benign neoplasm, unspecified site: Secondary | ICD-10-CM

## 2022-05-10 DIAGNOSIS — D2361 Other benign neoplasm of skin of right upper limb, including shoulder: Secondary | ICD-10-CM

## 2022-05-10 DIAGNOSIS — D489 Neoplasm of uncertain behavior, unspecified: Secondary | ICD-10-CM

## 2022-05-10 DIAGNOSIS — D2371 Other benign neoplasm of skin of right lower limb, including hip: Secondary | ICD-10-CM

## 2022-05-10 DIAGNOSIS — L814 Other melanin hyperpigmentation: Secondary | ICD-10-CM

## 2022-05-10 DIAGNOSIS — D2362 Other benign neoplasm of skin of left upper limb, including shoulder: Secondary | ICD-10-CM

## 2022-05-10 DIAGNOSIS — L821 Other seborrheic keratosis: Secondary | ICD-10-CM

## 2022-05-10 DIAGNOSIS — L578 Other skin changes due to chronic exposure to nonionizing radiation: Secondary | ICD-10-CM

## 2022-05-10 HISTORY — DX: Basal cell carcinoma of skin, unspecified: C44.91

## 2022-05-10 MED ORDER — TRETINOIN 0.025 % EX CREA: TOPICAL_CREAM | Freq: Every day | CUTANEOUS | 11 refills | Status: DC

## 2022-05-10 NOTE — Patient Instructions (Addendum)
Can start tretinion 0.025 % cream apply pea sized amount to face nightly for acne follow by moisturizer.    Topical retinoid medications like tretinoin/Retin-A, adapalene/Differin, tazarotene/Fabior, and Epiduo/Epiduo Forte can cause dryness and irritation when first started. Only apply a pea-sized amount to the entire affected area. Avoid applying it around the eyes, edges of mouth and creases at the nose. If you experience irritation, use a good moisturizer first and/or apply the medicine less often. If you are doing well with the medicine, you can increase how often you use it until you are applying every night. Be careful with sun protection while using this medication as it can make you sensitive to the sun. This medicine should not be used by pregnant women.        Viral Wart (HPV) Counseling  Discussed viral / HPV (Human Papilloma Virus) etiology and risk of spread /infectivity to other areas of body as well as to other people.  Multiple treatments and methods may be required to clear warts and it is possible treatment may not be successful.  Treatment risks include discoloration; scarring and there is still potential for wart recurrence.   Call them tomorrow  WARTPEEL INSTRUCTIONS  WartPEEL is a medicine to treat warts that has been prescribed for you and can be filled only at a special compounding pharmacy. Please call the pharmacy below to fill your prescription. Once they confirm your address and payment, they will mail you the medicine. DO NOT USE THIS MEDICINE IF YOU ARE PREGNANT OR THINKING OF BECOMING PREGNANT.  [facility] Ph: [phone]  Must be dispensed in amber syringe to ensure quality of medication. PRIOR TO USING THIS MEDICATION, IT IS IMPORTANT TO INFORM YOUR PHYSICIAN IF YOU ARE PREGNANT OR THINKING OF BECOMING PREGNANT. **This medication was custom compounded for you based on the prescription orders of your physician.  How to use this  medication: Apply medication at bedtime. Apply very small amount of medication to a flat plastic applicator. Use the applicator to apply a thin layer directly onto the wart. Use care to avoid applying the medicine to healthy skin. The medicine will break down healthy skin as well as the warts. Cover the wart with the tape provided.  Put the cap back tightly on the syringe. Wash hands after applying the medicine. NEVER put the WartPEEL in the mouth, nose or eyes. Remove the occlusion in the morning and wash the area thoroughly.  In case of accidental ingestion or contact with the eye, nose or mouth, contact [place] or the local poison control.   What to expect: During the first few days of application the skin around the wart may swell and become white. This will slough off with continued applications. Normal Dosage: The medication is applied once daily for a time determined by your physician. Storage Requirements: Store this medication at room temperature. Keep out of reach of children. PROTECT FROM LIGHT. Expiration Date: The medication is good for four months from the date made. Do not keep outdated medication. Side effects: Rash and irritation, if medication is applied to good skin. Cautions and Warnings: Only apply the medication to the warts. Do not apply to good skin. Keep away from children. Do not use on nose, eyes or the mouth.        Recommend OTC Gold Bond Rapid Relief Anti-Itch cream (pramoxine + menthol), CeraVe Anti-itch cream or lotion (pramoxine), Sarna lotion (Original- menthol + camphor or Sensitive- pramoxine) or Eucerin 12 hour Itch Relief lotion (  menthol) up to 3 times per day to areas on body that are itchy.   Gentle Skin Care Guide  1. Bathe no more than once a day.  2. Avoid bathing in hot water  3. Use a mild soap like Dove, Vanicream, Cetaphil, CeraVe. Can use Lever 2000 or Cetaphil antibacterial soap  4. Use soap only where you need it. On most  days, use it under your arms, between your legs, and on your feet. Let the water rinse other areas unless visibly dirty.  5. When you get out of the bath/shower, use a towel to gently blot your skin dry, don't rub it.  6. While your skin is still a little damp, apply a moisturizing cream such as Vanicream, CeraVe, Cetaphil, Eucerin, Sarna lotion or plain Vaseline Jelly. For hands apply Neutrogena [place] Hand Cream or Excipial Hand Cream.  7. Reapply moisturizer any time you start to itch or feel dry.  8. Sometimes using free and clear laundry detergents can be helpful. Fabric softener sheets should be avoided. Downy Free & Gentle liquid, or any liquid fabric softener that is free of dyes and perfumes, it acceptable to use  9. If your doctor has given you prescription creams you may apply moisturizers over them      Seborrheic Keratosis  What causes seborrheic keratoses? Seborrheic keratoses are harmless, common skin growths that first appear during adult life.  As time goes by, more growths appear.  Some people may develop a large number of them.  Seborrheic keratoses appear on both covered and uncovered body parts.  They are not caused by sunlight.  The tendency to develop seborrheic keratoses can be inherited.  They vary in color from skin-colored to gray, brown, or even black.  They can be either smooth or have a rough, warty surface.   Seborrheic keratoses are superficial and look as if they were stuck on the skin.  Under the microscope this type of keratosis looks like layers upon layers of skin.  That is why at times the top layer may seem to fall off, but the rest of the growth remains and re-grows.    Treatment Seborrheic keratoses do not need to be treated, but can easily be removed in the office.  Seborrheic keratoses often cause symptoms when they rub on clothing or jewelry.  Lesions can be in the way of shaving.  If they become inflamed, they can cause itching, soreness, or  burning.  Removal of a seborrheic keratosis can be accomplished by freezing, burning, or surgery. If any spot bleeds, scabs, or grows rapidly, please return to have it checked, as these can be an indication of a skin cancer.     Recommend taking Heliocare sun protection supplement daily in sunny weather for additional sun protection. For maximum protection on the sunniest days, you can take up to 2 capsules of regular Heliocare OR take 1 capsule of Heliocare Ultra. For prolonged exposure (such as a full day in the sun), you can repeat your dose of the supplement 4 hours after your first dose. Heliocare can be purchased at [employer], at some [facility] or at [email or web address].      Melanoma ABCDEs  Melanoma is the most dangerous type of skin cancer, and is the leading cause of death from skin disease.  You are more likely to develop melanoma if you: Have light-colored skin, light-colored eyes, or red or blond hair Spend a lot of time in the sun Tan regularly, either outdoors or in  a tanning bed Have had blistering sunburns, especially during childhood Have a close family member who has had a melanoma Have atypical moles or large birthmarks  Early detection of melanoma is key since treatment is typically straightforward and cure rates are extremely high if we catch it early.   The first sign of melanoma is often a change in a mole or a new dark spot.  The ABCDE system is a way of remembering the signs of melanoma.  A for asymmetry:  The two halves do not match. B for border:  The edges of the growth are irregular. C for color:  A mixture of colors are present instead of an even brown color. D for diameter:  Melanomas are usually (but not always) greater than 27m - the size of a pencil eraser. E for evolution:  The spot keeps changing in size, shape, and color.  Please check your skin once per month between visits. You can use a small mirror in front and a large mirror behind you  to keep an eye on the back side or your body.   If you see any new or changing lesions before your next follow-up, please call to schedule a visit.  Please continue daily skin protection including broad spectrum sunscreen SPF 30+ to sun-exposed areas, reapplying every 2 hours as needed when you're outdoors.   Staying in the shade or wearing long sleeves, sun glasses (UVA+UVB protection) and wide brim hats (4-inch brim around the entire circumference of the hat) are also recommended for sun protection.         Due to recent changes in healthcare laws, you may see results of your pathology and/or laboratory studies on MyChart before the doctors have had a chance to review them. We understand that in some cases there may be results that are confusing or concerning to you. Please understand that not all results are received at the same time and often the doctors may need to interpret multiple results in order to provide you with the best plan of care or course of treatment. Therefore, we ask that you please give u[place]2 business days to thoroughly review all your results before contacting the office for clarification. Should we see a critical lab result, you will be contacted sooner.   If You Need Anything After Your Visit  If you have any questions or concerns for your doctor, please call our main line at [phone] press option 4 to reach your doctor's medical assistant. If no one answers, please leave a voicemail as directed and we will return your call as soon as possible. Messages left after 4 pm will be answered the following business day.   You may also send [place] message via [facility] We typically respond to MyChart messages within 1-2 business days.  For prescription refills, please ask your pharmacy to contact our office. Our fax number is [phone]  If you have an urgent issue when the clinic is closed that cannot wait until the next business day, you can page your doctor at the  number below.    Please note that while we do our best to be available for urgent issues outside of office hours, we are not available 24/7.   If you have an urgent issue and are unable to reach u[place] you may choose to seek medical care at your doctor's office, retail clinic, urgent care center, or emergency room.  If you have a medical emergency, please immediately call 911 or go  to the emergency department.  Pager Numbers  - Dr. Nehemiah Massed: [phone]  - Dr. Laurence Ferrari: [phone]  - Dr. Nicole Kindred: [phone]  In the event of inclement weather, please call our main line at [phone] for an update on the status of any delays or closures.  Dermatology Medication Tips: Please keep the boxes that topical medications come in in order to help keep track of the instructions about where and how to use these. Pharmacies typically print the medication instructions only on the boxes and not directly on the medication tubes.   If your medication is too expensive, please contact our office at [phone] option 4 or send [place] a message through [place].   We are unable to tell what your co-pay for medications will be in advance as this is different depending on your insurance coverage. However, we may be able to find a substitute medication at lower cost or fill out paperwork to get insurance to cover a needed medication.   If a prior authorization is required to get your medication covered by your insurance company, please allow [place] 1-2 business days to complete this process.  Drug prices often vary depending on where the prescription is filled and some pharmacies may offer cheaper prices.  The website www.goodrx.com contains coupons for medications through different pharmacies. The prices here do not account for what the cost may be with help from insurance (it may be cheaper with your insurance), but the website can give you the price if you did not use any insurance.  - You can print the associated  coupon and take it with your prescription to the pharmacy.  - You may also stop by our office during regular business hours and pick up a GoodRx coupon card.  - If you need your prescription sent electronically to a different pharmacy, notify our office through [facility] or by phone at [phone] option 4.     Si Usted Necesita Algo Despus de Su Visita  Tambin puede enviarnos un mensaje a travs de Pharmacist, community. Por lo general respondemos a los mensajes de MyChart en el transcurso de 1 a 2 das hbiles.  Para renovar recetas, por favor pida a su farmacia que se ponga en contacto con nuestra oficina. Harland Dingwall de fax es [place] [phone].  Si tiene un asunto urgente cuando la clnica est cerrada y que no puede esperar hasta el siguiente da hbil, puede llamar/localizar a su doctor(a) al nmero que aparece a continuacin.   Por favor, tenga en cuenta que aunque hacemos todo lo posible para estar disponibles para asuntos urgentes fuera del horario de [place], no estamos disponibles las 24 horas del da, los 7 das de la [place].   Si tiene un problema urgente y no puede comunicarse con nosotros, puede optar por buscar atencin mdica  en el consultorio de su doctor(a), en una clnica privada, en un centro de atencin urgente o en una sala de emergencias.  Si tiene Engineering geologist, por favor llame inmediatamente al 911 o vaya a la sala de emergencias.  Nmeros de bper  - Dr. Nehemiah Massed: [phone]  - Dra. Moye: [phone]  - Dra. Nicole Kindred: [phone]  En caso de inclemencias del [place], por favor llame a Johnsie Kindred principal al [phone] para una actualizacin sobre el [place] de cualquier retraso o cierre.  Consejos para la medicacin en dermatologa: Por favor, guarde las cajas en las que vienen los medicamentos de uso tpico para ayudarle a seguir las instrucciones sobre dnde y cmo usarlos.  Las farmacias generalmente imprimen las instrucciones del  medicamento slo en las cajas y no directamente en los tubos del [place].   Si su medicamento es muy caro, por favor, pngase en contacto con Zigmund Daniel llamando al [phone] y presione la opcin 4 o envenos un mensaje a travs de Pharmacist, community.   No podemos decirle cul ser su copago por los medicamentos por adelantado ya que esto es diferente dependiendo de la cobertura de su seguro. Sin embargo, es posible que podamos encontrar un medicamento sustituto a Electrical engineer un formulario para que el seguro cubra el medicamento que se considera necesario.   Si se requiere una autorizacin previa para que su compaa de seguros [place] su medicamento, por favor permtanos de 1 a 2 das hbiles para completar este proceso.  Los precios de los medicamentos varan con frecuencia dependiendo del Environmental consultant de dnde se surte la receta y alguna farmacias pueden ofrecer precios ms baratos.  El sitio web www.goodrx.com tiene cupones para medicamentos de Airline pilot. Los precios aqu no tienen en cuenta lo que podra costar con la ayuda del seguro (puede ser ms barato con su seguro), pero el sitio web puede darle el precio si no utiliz Research scientist (physical sciences).  - Puede imprimir el cupn correspondiente y llevarlo con su receta a la farmacia.  - Tambin puede pasar por nuestra oficina durante el horario de atencin regular y Charity fundraiser una tarjeta de cupones de GoodRx.  - Si necesita que su receta se enve electrnicamente a una farmacia diferente, informe a nuestra oficina a travs de MyChart de [place] o por telfono llamando al [phone] y presione la opcin 4.

## 2022-05-10 NOTE — Progress Notes (Signed)
Follow-Up Visit   Subjective  Rita Foster is a 61 y.o. female who presents for the following: Annual Exam (1 year tbse, hx of aks, hx of isk, reports areas she would like checked behind left ear, itchy dry areas at back, place at left inner corner of eye, bump at left heel that sometimes is sore, and dry places at face. ).  The patient presents for Total-Body Skin Exam (TBSE) for skin cancer screening and mole check.  The patient has spots, moles and lesions to be evaluated, some may be new or changing and the patient has concerns that these could be cancer.   The following portions of the chart were reviewed this encounter and updated as appropriate:  Tobacco  Allergies  Meds  Problems  Med Hx  Surg Hx  Fam Hx      Review of Systems: No other skin or systemic complaints except as noted in HPI or Assessment and Plan.   Objective  Well appearing patient in no apparent distress; mood and affect are within normal limits.  A full examination was performed including scalp, head, eyes, ears, nose, lips, neck, chest, axillae, abdomen, back, buttocks, bilateral upper extremities, bilateral lower extremities, hands, feet, fingers, toes, fingernails, and toenails. All findings within normal limits unless otherwise noted below.  Left Nasal Sidewall 0.5 cm light smooth papule  left heel and left thumb Verrucous papules -- Discussed viral etiology and contagion.   face Trace to 1+ open comedones  left cheek x 1 Erythematous thin papules/macules with gritty scale.   tip of nose Smooth pink papule without features suspicious for malignancy on dermoscopy    Assessment & Plan  Neoplasm of uncertain behavior Left Nasal Sidewall  Skin / nail biopsy Type of biopsy: tangential   Informed consent: discussed and consent obtained   Timeout: patient name, date of birth, surgical site, and procedure verified   Patient was prepped and draped in usual sterile fashion: Area prepped  with alcohol. Anesthesia: the lesion was anesthetized in a standard fashion   Anesthetic:  1% lidocaine w/ epinephrine 1-100,000 buffered w/ 8.4% NaHCO3 Instrument used: flexible razor blade   Hemostasis achieved with: pressure, aluminum chloride and electrodesiccation   Outcome: patient tolerated procedure well   Post-procedure details: wound care instructions given   Post-procedure details comment:  Ointment and small bandage applied Additional details:  Mupirocin and a bandage applied  Epidermal cyst vs bcc   Patient request bx be sent through [facility]    Related Procedures Anatomic Pathology Report  Viral warts, unspecified type left heel and left thumb  Viral Wart (HPV) Counseling  Discussed viral / HPV (Human Papilloma Virus) etiology and risk of spread /infectivity to other areas of body as well as to other people.  Multiple treatments and methods may be required to clear warts and it is possible treatment may not be successful.  Treatment risks include discoloration; scarring and there is still potential for wart recurrence.  Discussed in office treatment vs at home treatment with rx WartPEEL (5-fluorouracil/salicylic acid) Information included in patient's print out with instructions Rx faxed 2 refills    Acne, unspecified acne type face  Chronic and persistent condition with duration or expected duration over one year. Condition is symptomatic/ bothersome to patient. Not currently at goal.  Start tretinoin 0.025 % cream apply topically to face at bedtime followed by moisturizer.  Topical retinoid medications like tretinoin/Retin-A, adapalene/Differin, tazarotene/Fabior, and Epiduo/Epiduo Forte can cause dryness and irritation when first started. Only  apply a pea-sized amount to the entire affected area. Avoid applying it around the eyes, edges of mouth and creases at the nose. If you experience irritation, use a good moisturizer first and/or apply the medicine less  often. If you are doing well with the medicine, you can increase how often you use it until you are applying every night. Be careful with sun protection while using this medication as it can make you sensitive to the sun. This medicine should not be used by pregnant women.    tretinoin (RETIN-A) 0.025 % cream - face Apply topically at bedtime. For acne at face. Can use followed by moisturizer if irritating.  Actinic keratosis left cheek x 1  Actinic keratoses are precancerous spots that appear secondary to cumulative UV radiation exposure/sun exposure over time. They are chronic with expected duration over 1 year. A portion of actinic keratoses will progress to squamous cell carcinoma of the skin. It is not possible to reliably predict which spots will progress to skin cancer and so treatment is recommended to prevent development of skin cancer.  Recommend daily broad spectrum sunscreen SPF 30+ to sun-exposed areas, reapply every 2 hours as needed.  Recommend staying in the shade or wearing long sleeves, sun glasses (UVA+UVB protection) and wide brim hats (4-inch brim around the entire circumference of the hat). Call for new or changing lesions.  Destruction of lesion - left cheek x 1  Destruction method: cryotherapy   Informed consent: discussed and consent obtained   Lesion destroyed using liquid nitrogen: Yes   Cryotherapy cycles:  2 Outcome: patient tolerated procedure well with no complications   Post-procedure details: wound care instructions given   Additional details:  Prior to procedure, discussed risks of blister formation, small wound, skin dyspigmentation, or rare scar following cryotherapy. Recommend Vaseline ointment to treated areas while healing.   Angiofibroma tip of nose  Benign-appearing.  Observation.  Call clinic for new or changing lesions.    Lentigines - Scattered tan macules - Due to sun exposure - Benign-appearing, observe - Recommend daily broad spectrum  sunscreen SPF 30+ to sun-exposed areas, reapply every 2 hours as needed. - Call for any changes  Seborrheic Keratoses At back - Stuck-on, waxy, tan-brown papules and/or plaques  - Benign-appearing - Discussed benign etiology and prognosis. - Observe - Call for any changes  Xerosis - diffuse xerotic patches - recommend gentle, hydrating skin care - gentle skin care handout given   Melanocytic Nevi - Tan-brown and/or pink-flesh-colored symmetric macules and papules - Benign appearing on exam today - Observation - Call clinic for new or changing moles - Recommend daily use of broad spectrum spf 30+ sunscreen to sun-exposed areas.   Hemangiomas - Red papules - Discussed benign nature - Observe - Call for any changes  Dermatofibroma Right pretibia and left shoulder - Firm pink/brown papulenodule with dimple sign - Benign appearing - Call for any changes  Actinic Damage - Chronic condition, secondary to cumulative UV/sun exposure - diffuse scaly erythematous macules with underlying dyspigmentation - Recommend daily broad spectrum sunscreen SPF 30+ to sun-exposed areas, reapply every 2 hours as needed.  - Staying in the shade or wearing long sleeves, sun glasses (UVA+UVB protection) and wide brim hats (4-inch brim around the entire circumference of the hat) are also recommended for sun protection.  - Call for new or changing lesions.   Skin cancer screening performed today. Return in about 1 year (around 05/11/2023) for TBSE. IRuthell Rummage, CMA, am acting as scribe  for Forest Gleason, MD.  Documentation: I have reviewed the above documentation for accuracy and completeness, and I agree with the above.  Forest Gleason, MD

## 2022-05-12 ENCOUNTER — Telehealth: Payer: Self-pay

## 2022-05-12 NOTE — Telephone Encounter (Signed)
Patient called stating that [place] mail order pharmacy didn't have her wart peel prescription and they aren't licensed to send prescriptions to [place]. Patient is requesting alternative medication please advise.

## 2022-05-13 ENCOUNTER — Encounter: Payer: Self-pay | Admitting: Dermatology

## 2022-05-13 NOTE — Telephone Encounter (Signed)
Please call Hermina Staggers to figure out what happened. They send to [place] all the time, so this does not make sense unless something has changed. I believe Melissa had me sign the paperwork, so this should have been faxed. If they do not deliver to [place] anymore, please let me know and we can substitute skin medicinals salicylic acid/fluorouracil. Thank you

## 2022-05-15 ENCOUNTER — Telehealth: Payer: Self-pay

## 2022-05-15 NOTE — Telephone Encounter (Signed)
Thank you :)

## 2022-05-15 NOTE — Telephone Encounter (Signed)
I called [place] and spoke with [place].and was told [facility] in [place] closed Apr 14, 2022 and they had [place] pharmacy license, this [employer] in [place] that all the rx's are getting forwarded to applied for a [place] license 8 weeks ago and have not been approved, they are still waiting for approval once they get [place] license they will send the prescription,   Patient do not want to wait so I will send substitute skin medicinals salicylic acid/fluorouracil.

## 2022-05-16 ENCOUNTER — Other Ambulatory Visit: Payer: Self-pay

## 2022-05-16 MED ORDER — FLUOROURACIL 5 % EX CREA: TOPICAL_CREAM | CUTANEOUS | 2 refills | Status: DC

## 2022-05-18 LAB — ANATOMIC PATHOLOGY REPORT

## 2022-05-23 ENCOUNTER — Telehealth: Payer: Self-pay

## 2022-05-23 ENCOUNTER — Other Ambulatory Visit: Payer: Self-pay

## 2022-05-23 DIAGNOSIS — C44311 Basal cell carcinoma of skin of nose: Secondary | ICD-10-CM

## 2022-05-23 NOTE — Telephone Encounter (Signed)
Discussed pathology results. Referral sent to Dr. Manley Mason for Mohs. Patient is having knee surgery 07/05/2021. Patient voiced understanding. 6 month TBSE scheduled.

## 2022-05-23 NOTE — Telephone Encounter (Signed)
-----   Message from Alfonso Patten, MD sent at 05/18/2022 10:06 AM EST ----- Specimen A-Skin Excision, left nasal sidewall: BASAL CELL CARCINOMA, NODULAR TYPE. THE DEEP AND LATERAL MARGINS ARE INVOLVED. --> Mohs  I called pt and no answer.   MAs please call with results and refer. Also please schedule for 6 month FBSE.  Please let me know if they have any questions. Thank you!

## 2022-05-31 NOTE — Progress Notes (Deleted)
Complete physical exam   Patient: Rita Foster   DOB: 03-04-1961   61 y.o. Female  MRN: [id number] Visit Date: 06/02/2022  Today's healthcare provider: [name], FNP   No chief complaint on file.  Subjective    Rita Foster is a 61 y.o. female who presents today for a complete physical exam.  She reports consuming a {diet types:17450} diet. {Exercise:19826} She generally feels {well/fairly well/poorly:18703}. She reports sleeping {well/fairly well/poorly:18703}. She {does/does not:200015} have additional problems to discuss today.  HPI  ***  Past Medical History:  Diagnosis Date   Basal cell carcinoma 05/10/2022   Left nasal sidewall. Nodular type. Mohs pending.   Past Surgical History:  Procedure Laterality Date   BREAST BIOPSY Left 10+ years ago   Negative core   BREAST EXCISIONAL BIOPSY Left    2002   CATARACT EXTRACTION Right 07/2014   HERNIA REPAIR  02/21/2010   TONSILLECTOMY AND ADENOIDECTOMY  1968   Social History   Socioeconomic History   Marital status: Divorced    Spouse name: Not on file   Number of children: Not on file   Years of education: Not on file   Highest education level: Not on file  Occupational History   Not on file  Tobacco Use   Smoking status: Former    Years: 5.00    Types: Cigarettes    Quit date: 07/03/1999    Years since quitting: 22.9   Smokeless tobacco: Never  Substance and Sexual Activity   Alcohol use: No   Drug use: No   Sexual activity: Not on file  Other Topics Concern   Not on file  Social History Narrative   Not on file   Social Determinants of Health   Financial Resource Strain: Not on file  Food Insecurity: Not on file  Transportation Needs: Not on file  Physical Activity: Not on file  Stress: Not on file  Social Connections: Not on file  Intimate Partner Violence: Not on file   Family Status  Relation Name Status   Mother  Deceased   Father  Alive   MGM  Deceased at age 58        natural causes   MGF  Deceased       artherosclerosis   PGM  Deceased       old age   PGF  Deceased       unknown causes   Family History  Problem Relation Age of Onset   Crohn's disease Mother    Thyroid disease Mother    Rheum arthritis Mother    Breast cancer Mother 57   Hypertension Father    Skin cancer Father    Allergies  Allergen Reactions   Gabapentin Other (See Comments)    dizziness   Mobic [Meloxicam] Other (See Comments)    Abdominal pain   Penicillins     Patient Care Team: [name], FNP as PCP - General (Family Medicine)   Medications: Outpatient Medications Prior to Visit  Medication Sig   acetaminophen (TYLENOL) 500 MG tablet Take 650 mg by mouth at bedtime as needed for mild pain or moderate pain.    acyclovir (ZOVIRAX) 400 MG tablet TAKE 1 TABLET(400 MG) BY MOUTH TWICE DAILY   albuterol (VENTOLIN HFA) 108 (90 Base) MCG/ACT inhaler Inhale 2 puffs into the lungs every 6 (six) hours as needed for wheezing or shortness of breath.   amLODipine (NORVASC) 10 MG tablet TAKE 1 TABLET(10 MG)  BY MOUTH DAILY   Biotin w/ Vitamins C & E (HAIR SKIN & NAILS GUMMIES PO) Take 1 tablet by mouth daily.   Blood Pressure Monitoring (BLOOD PRESSURE MONITOR/WRIST) DEVI To check bp daily   diclofenac (VOLTAREN) 50 MG EC tablet Take 50 mg by mouth 2 (two) times daily.   diclofenac Sodium (VOLTAREN) 1 % GEL Apply 2 g topically in the morning and at bedtime.   fluorouracil (EFUDEX) 5 % cream Apply to warts at bedtime,  cover with tape wash off in the morning   losartan (COZAAR) 100 MG tablet TAKE 1 TABLET(100 MG) BY MOUTH DAILY   Misc Natural Products (OSTEO BI-FLEX JOINT SHIELD) TABS Take 2 tablets by mouth daily at 12 noon.    Multiple Vitamin tablet Take 1 tablet by mouth daily.    Probiotic Product (PROBIOTIC PO) Take by mouth.   traZODone (DESYREL) 50 MG tablet TAKE 1 TABLET(50 MG) BY MOUTH AT BEDTIME AS NEEDED FOR SLEEP   tretinoin (RETIN-A) 0.025 % cream Apply  topically at bedtime. For acne at face. Can use followed by moisturizer if irritating.   Turmeric 500 MG CAPS Take 1,000 mg by mouth daily at 12 noon.   No facility-administered medications prior to visit.    Review of Systems  {Labs  Heme  Chem  Endocrine  Serology  Results Review (optional):23779}  Objective    There were no vitals taken for this visit. {Show previous vital signs (optional):23777}   Physical Exam  ***  Last depression screening scores    01/04/2022    9:59 AM 09/15/2020    9:53 AM 05/24/2020    9:06 AM  PHQ 2/9 Scores  PHQ - 2 Score 0 3 0  PHQ- 9 Score '3 13 6   '$ Last fall risk screening    01/04/2022    9:59 AM  Fall Risk   Falls in the past year? 0  Number falls in past yr: 0  Injury with Fall? 0   Last Audit-C alcohol use screening    01/04/2022    9:59 AM  Alcohol Use Disorder Test (AUDIT)  1. How often do you have a drink containing alcohol? 0  2. How many drinks containing alcohol do you have on a typical day when you are drinking? 0  3. How often do you have six or more drinks on one occasion? 0  AUDIT-C Score 0   A score of 3 or more in women, and 4 or more in men indicates increased risk for alcohol abuse, EXCEPT if all of the points are from question 1   No results found for any visits on 06/02/22.  Assessment & Plan    Routine Health Maintenance and Physical Exam  Exercise Activities and Dietary recommendations  Goals   None     Immunization History  Administered Date(s) Administered   Influenza,inj,Quad PF,6+ Mos 03/27/2019, 06/01/2021, 05/03/2022   Influenza-Unspecified 04/02/2020   Pneumococcal Polysaccharide-23 05/03/2005   Td 08/04/2002, 05/24/2020   Tdap 01/27/2010    Health Maintenance  Topic Date Due   COVID-19 Vaccine (1) Never done   Zoster Vaccines- Shingrix (1 of 2) Never done   MAMMOGRAM  07/12/2022   COLONOSCOPY (Pts 45-73yr Insurance coverage will need to be confirmed)  08/30/2023   PAP SMEAR-Modifier   06/01/2024   DTaP/Tdap/Td (4 - Td or Tdap) 05/24/2030   INFLUENZA VACCINE  Completed   Hepatitis C Screening  Completed   HIV Screening  Completed   HPV VACCINES  Aged Out  Discussed health benefits of physical activity, and encouraged her to engage in regular exercise appropriate for her age and condition.  ***  No follow-ups on file.     {provider attestation***:1}   [name], [place] [phone] (phone) [phone] (fax)  [place]

## 2022-06-02 ENCOUNTER — Encounter: Payer: Managed Care, Other (non HMO) | Admitting: Family Medicine

## 2022-06-08 NOTE — Progress Notes (Signed)
I,Connie R Striblin,acting as a Education administrator for Rita Sprout, FNP.,have documented all relevant documentation on the behalf of Rita Sprout, FNP,as directed by  Rita Sprout, FNP while in the presence of Rita Sprout, FNP.   Complete physical exam   Patient: Rita Foster   DOB: Jan 19, 1961   61 y.o. Female  MRN: [id number] Visit Date: 06/09/2022  Today's healthcare provider: [name], FNP  Re Introduced to nurse practitioner role and practice setting.  All questions answered.  Discussed provider/patient relationship and expectations.  Chief Complaint  Patient presents with   Annual Exam   Subjective    Rita Foster is a 61 y.o. female who presents today for a complete physical exam.  She reports consuming a general diet. The patient does not participate in regular exercise at present. She generally feels poorly. She reports sleeping fairly well. She does not have additional problems to discuss today.  Pt would like pap and breast exam. HPI   Past Medical History:  Diagnosis Date   Basal cell carcinoma 05/10/2022   Left nasal sidewall. Nodular type. Mohs pending.   Past Surgical History:  Procedure Laterality Date   BREAST BIOPSY Left 10+ years ago   Negative core   BREAST EXCISIONAL BIOPSY Left    2002   CATARACT EXTRACTION Right 07/2014   HERNIA REPAIR  02/21/2010   TONSILLECTOMY AND ADENOIDECTOMY  1968   Social History   Socioeconomic History   Marital status: Divorced    Spouse name: Not on file   Number of children: Not on file   Years of education: Not on file   Highest education level: Not on file  Occupational History   Not on file  Tobacco Use   Smoking status: Former    Years: 5.00    Types: Cigarettes    Quit date: 07/03/1999    Years since quitting: 22.9   Smokeless tobacco: Never  Substance and Sexual Activity   Alcohol use: No   Drug use: No   Sexual activity: Not on file  Other Topics Concern   Not on file  Social History  Narrative   Not on file   Social Determinants of Health   Financial Resource Strain: Not on file  Food Insecurity: Not on file  Transportation Needs: Not on file  Physical Activity: Not on file  Stress: Not on file  Social Connections: Not on file  Intimate Partner Violence: Not on file   Family Status  Relation Name Status   Mother  Deceased   Father  Alive   MGM  Deceased at age 4       natural causes   MGF  Deceased       artherosclerosis   PGM  Deceased       old age   PGF  Deceased       unknown causes   Family History  Problem Relation Age of Onset   Crohn's disease Mother    Thyroid disease Mother    Rheum arthritis Mother    Breast cancer Mother 99   Hypertension Father    Skin cancer Father    Allergies  Allergen Reactions   Gabapentin Other (See Comments)    dizziness   Mobic [Meloxicam] Other (See Comments)    Abdominal pain   Penicillins     Patient Care Team: Rita Sprout, FNP as PCP - General (Family Medicine)   Medications: Outpatient Medications Prior to Visit  Medication  Sig   acetaminophen (TYLENOL) 500 MG tablet Take 650 mg by mouth at bedtime as needed for mild pain or moderate pain.    albuterol (VENTOLIN HFA) 108 (90 Base) MCG/ACT inhaler Inhale 2 puffs into the lungs every 6 (six) hours as needed for wheezing or shortness of breath.   amLODipine (NORVASC) 10 MG tablet TAKE 1 TABLET(10 MG) BY MOUTH DAILY   Biotin w/ Vitamins C & E (HAIR SKIN & NAILS GUMMIES PO) Take 1 tablet by mouth daily.   Blood Pressure Monitoring (BLOOD PRESSURE MONITOR/WRIST) DEVI To check bp daily   diclofenac (VOLTAREN) 50 MG EC tablet Take 50 mg by mouth 2 (two) times daily.   diclofenac Sodium (VOLTAREN) 1 % GEL Apply 2 g topically in the morning and at bedtime.   Misc Natural Products (OSTEO BI-FLEX JOINT SHIELD) TABS Take 2 tablets by mouth daily at 12 noon.    Multiple Vitamin tablet Take 1 tablet by mouth daily.    Probiotic Product (PROBIOTIC PO) Take  by mouth.   traZODone (DESYREL) 50 MG tablet TAKE 1 TABLET(50 MG) BY MOUTH AT BEDTIME AS NEEDED FOR SLEEP   tretinoin (RETIN-A) 0.025 % cream Apply topically at bedtime. For acne at face. Can use followed by moisturizer if irritating.   Turmeric 500 MG CAPS Take 1,000 mg by mouth daily at 12 noon.   [DISCONTINUED] acyclovir (ZOVIRAX) 400 MG tablet TAKE 1 TABLET(400 MG) BY MOUTH TWICE DAILY   [DISCONTINUED] losartan (COZAAR) 100 MG tablet TAKE 1 TABLET(100 MG) BY MOUTH DAILY   [DISCONTINUED] fluorouracil (EFUDEX) 5 % cream Apply to warts at bedtime,  cover with tape wash off in the morning (Patient not taking: Reported on 06/09/2022)   No facility-administered medications prior to visit.    Review of Systems  Last CBC Lab Results  Component Value Date   WBC 9.9 01/04/2022   HGB 13.2 01/04/2022   HCT 40.2 01/04/2022   MCV 91 01/04/2022   MCH 29.9 01/04/2022   RDW 12.9 01/04/2022   PLT 312 28/41/3244   Last metabolic panel Lab Results  Component Value Date   GLUCOSE 81 01/04/2022   NA 138 01/04/2022   K 4.5 01/04/2022   CL 101 01/04/2022   CO2 24 01/04/2022   BUN 16 01/04/2022   CREATININE 0.67 01/04/2022   EGFR 99 01/04/2022   CALCIUM 9.8 01/04/2022   PROT 6.7 01/04/2022   ALBUMIN 4.2 01/04/2022   LABGLOB 2.5 01/04/2022   AGRATIO 1.7 01/04/2022   BILITOT 0.4 01/04/2022   ALKPHOS 72 01/04/2022   AST 20 01/04/2022   ALT 14 01/04/2022   ANIONGAP 6 03/28/2019   Last lipids Lab Results  Component Value Date   CHOL 199 01/04/2022   HDL 65 01/04/2022   LDLCALC 121 (H) 01/04/2022   TRIG 69 01/04/2022   CHOLHDL 3.1 01/04/2022   Last hemoglobin A1c Lab Results  Component Value Date   HGBA1C 5.9 (H) 01/04/2022   Last thyroid functions Lab Results  Component Value Date   TSH 1.390 01/04/2022    Objective    BP 138/83 (BP Location: Right Arm, Patient Position: Sitting, Cuff Size: Normal)   Pulse 66   Temp 98.2 F (36.8 C) (Oral)   Wt 260 lb (117.9 kg)   SpO2  99%   BMI 39.53 kg/m   BP Readings from Last 3 Encounters:  06/09/22 138/83  01/04/22 121/78  06/01/21 128/77   Wt Readings from Last 3 Encounters:  06/09/22 260 lb (117.9 kg)  01/04/22  260 lb (117.9 kg)  06/01/21 263 lb 3.2 oz (119.4 kg)   SpO2 Readings from Last 3 Encounters:  06/09/22 99%  01/04/22 99%  06/01/21 99%   Physical Exam Vitals and nursing note reviewed. Exam conducted with a chaperone present.  Constitutional:      General: She is awake. She is not in acute distress.    Appearance: Normal appearance. She is well-developed and well-groomed. She is obese. She is not ill-appearing, toxic-appearing or diaphoretic.  HENT:     Head: Normocephalic and atraumatic.     Jaw: There is normal jaw occlusion. No trismus, tenderness, swelling or pain on movement.     Right Ear: Hearing, tympanic membrane, ear canal and external ear normal. There is no impacted cerumen.     Left Ear: Hearing, tympanic membrane, ear canal and external ear normal. There is no impacted cerumen.     Nose: Nose normal. No congestion or rhinorrhea.     Right Turbinates: Not enlarged, swollen or pale.     Left Turbinates: Not enlarged, swollen or pale.     Right Sinus: No maxillary sinus tenderness or frontal sinus tenderness.     Left Sinus: No maxillary sinus tenderness or frontal sinus tenderness.     Mouth/Throat:     Lips: Pink.     Mouth: Mucous membranes are moist. No injury.     Tongue: No lesions.     Pharynx: Oropharynx is clear. Uvula midline. No pharyngeal swelling, oropharyngeal exudate, posterior oropharyngeal erythema or uvula swelling.     Tonsils: No tonsillar exudate or tonsillar abscesses.  Eyes:     General: Lids are normal. Lids are everted, no foreign bodies appreciated. Vision grossly intact. Gaze aligned appropriately. No allergic shiner or visual field deficit.       Right eye: No discharge.        Left eye: No discharge.     Extraocular Movements: Extraocular movements  intact.     Conjunctiva/sclera: Conjunctivae normal.     Right eye: Right conjunctiva is not injected. No exudate.    Left eye: Left conjunctiva is not injected. No exudate.    Pupils: Pupils are equal, round, and reactive to light.  Neck:     Thyroid: No thyroid mass, thyromegaly or thyroid tenderness.     Vascular: No carotid bruit.     Trachea: Trachea normal.  Cardiovascular:     Rate and Rhythm: Normal rate and regular rhythm.     Pulses: Normal pulses.          Carotid pulses are 2+ on the right side and 2+ on the left side.      Radial pulses are 2+ on the right side and 2+ on the left side.       Dorsalis pedis pulses are 2+ on the right side and 2+ on the left side.       Posterior tibial pulses are 2+ on the right side and 2+ on the left side.     Heart sounds: Normal heart sounds, S1 normal and S2 normal. No murmur heard.    No friction rub. No gallop.  Pulmonary:     Effort: Pulmonary effort is normal. No respiratory distress.     Breath sounds: Normal breath sounds and air entry. No stridor. No wheezing, rhonchi or rales.  Chest:     Chest wall: No tenderness.     Comments: Breasts: breasts appear normal, no suspicious masses, no skin or nipple changes or axillary nodes, right  breast normal without mass, skin or nipple changes or axillary nodes, left breast normal without mass, skin or nipple changes or axillary nodes, unchanged from previous exams, risk and benefit of breast self-exam was discussed  Abdominal:     General: Abdomen is flat. Bowel sounds are normal. There is no distension.     Palpations: Abdomen is soft. There is no mass.     Tenderness: There is no abdominal tenderness. There is no right CVA tenderness, left CVA tenderness, guarding or rebound.     Hernia: No hernia is present. There is no hernia in the left inguinal area or right inguinal area.  Genitourinary:    General: Normal vulva.     Pubic Area: No rash or pubic lice.      Tanner stage (genital):  5.     Labia:        Right: No rash, tenderness, lesion or injury.        Left: No rash, tenderness, lesion or injury.      Urethra: No prolapse, urethral pain, urethral swelling or urethral lesion.     Vagina: Vaginal discharge present.     Cervix: Normal.     Uterus: Normal.      Adnexa: Right adnexa normal and left adnexa normal.  Musculoskeletal:        General: No swelling, tenderness, deformity or signs of injury. Normal range of motion.     Cervical back: Full passive range of motion without pain, normal range of motion and neck supple. No edema, rigidity or tenderness. No muscular tenderness.     Right lower leg: No edema.     Left lower leg: No edema.  Lymphadenopathy:     Cervical: No cervical adenopathy.     Right cervical: No superficial, deep or posterior cervical adenopathy.    Left cervical: No superficial, deep or posterior cervical adenopathy.     Lower Body: No right inguinal adenopathy. No left inguinal adenopathy.  Skin:    General: Skin is warm and dry.     Capillary Refill: Capillary refill takes less than 2 seconds.     Coloration: Skin is not jaundiced or pale.     Findings: No bruising, erythema, lesion or rash.  Neurological:     General: No focal deficit present.     Mental Status: She is alert and oriented to person, place, and time. Mental status is at baseline.     GCS: GCS eye subscore is 4. GCS verbal subscore is 5. GCS motor subscore is 6.     Sensory: Sensation is intact. No sensory deficit.     Motor: Motor function is intact. No weakness.     Coordination: Coordination is intact. Coordination normal.     Gait: Gait is intact. Gait normal.  Psychiatric:        Attention and Perception: Attention and perception normal.        Mood and Affect: Mood and affect normal.        Speech: Speech normal.        Behavior: Behavior normal. Behavior is cooperative.        Thought Content: Thought content normal.        Cognition and Memory: Cognition and  memory normal.        Judgment: Judgment normal.     Last depression screening scores    01/04/2022    9:59 AM 09/15/2020    9:53 AM 05/24/2020    9:06 AM  PHQ 2/9 Scores  PHQ - 2 Score 0 3 0  PHQ- 9 Score _0 Last fall risk screening    01/04/2022    9:59 AM  Fall Risk   Falls in the past year? 0  Number falls in past yr: 0  Injury with Fall? 0   Last Audit-C alcohol use screening    01/04/2022    9:59 AM  Alcohol Use Disorder Test (AUDIT)  1. How often do you have a drink containing alcohol? 0  2. How many drinks containing alcohol do you have on a typical day when you are drinking? 0  3. How often do you have six or more drinks on one occasion? 0  AUDIT-C Score 0   A score of 3 or more in women, and 4 or more in men indicates increased risk for alcohol abuse, EXCEPT if all of the points are from question 1   No results found for any visits on 06/09/22.  Assessment & Plan    Routine Health Maintenance and Physical Exam  Exercise Activities and Dietary recommendations  Goals   None     Immunization History  Administered Date(s) Administered   Influenza,inj,Quad PF,6+ Mos 03/27/2019, 06/01/2021, 05/03/2022   Influenza-Unspecified 04/02/2020   Pneumococcal Polysaccharide-23 05/03/2005   Td 08/04/2002, 05/24/2020   Tdap 01/27/2010    Health Maintenance  Topic Date Due   COVID-19 Vaccine (1) 06/25/2022 (Originally 05/27/1966)   Zoster Vaccines- Shingrix (1 of 2) 09/08/2022 (Originally 05/28/1979)   MAMMOGRAM  07/12/2022   COLONOSCOPY (Pts 45-4yr Insurance coverage will need to be confirmed)  08/30/2023   PAP SMEAR-Modifier  06/01/2024   DTaP/Tdap/Td (4 - Td or Tdap) 05/24/2030   INFLUENZA VACCINE  Completed   Hepatitis C Screening  Completed   HIV Screening  Completed   HPV VACCINES  Aged Out    Discussed health benefits of physical activity, and encouraged her to engage in regular exercise appropriate for her age and condition.  Problem List Items  Addressed This Visit       Cardiovascular and Mediastinum   Essential hypertension    Chronic, stable Continue Norvasc 10 mg and Losartan 100 mg Check CBC and CMP Goal <140/<80      Relevant Medications   losartan (COZAAR) 100 MG tablet     Other   Annual physical exam - Primary    Encouraged dental and vision Due for mammo Due for PAP per pt request Plans to have knee sx with Hooten MD in 07/2022 W/c bound at this time; extreme difficulty getting on table for exam Things to do to keep yourself healthy  - Exercise at least 30-45 minutes a day, 3-4 days a week.  - Eat a low-fat diet with lots of fruits and vegetables, up to 7-9 servings per day.  - Seatbelts can save your life. Wear them always.  - Smoke detectors on every level of your home, check batteries every year.  - Eye Doctor - have an eye exam every 1-2 years  - Safe sex - if you may be exposed to STDs, use a condom.  - Alcohol -  If you drink, do it moderately, less than 2 drinks per day.  - [place] Choose someone to speak for you if you are not able.  - Depression is common in our stressful world.If you're feeling down or losing interest in things you normally enjoy, please come in for a visit.  - Violence - If anyone is threatening or  hurting you, please call immediately.       Relevant Orders   Comprehensive Metabolic Panel (CMET)   CBC   Hemoglobin A1c   TSH   Lipid panel   Chronic pain of both knees    Followed by ortho Sx planned for R knee 07/2022      Herpes    Chronic, stable Wishes to stay on antivirals to assist Request for refills at this time       Relevant Medications   acyclovir (ZOVIRAX) 400 MG tablet   Other Relevant Orders   Cytology - PAP   Hypercholesteremia    Chronic, previously elevated with LDL of 121 Unable to exercise d/t chronic knee pain Repeat LP Not currently on statin; hx of tobacco use in remission       Relevant Medications   losartan  (COZAAR) 100 MG tablet   Morbid obesity (HCC)    Chronic, unknown- pt reported weight today Body mass index is 39.53 kg/m. Discussed importance of healthy weight management Discussed diet and exercise       Screening for cervical cancer   Relevant Orders   Cytology - PAP   Visit for screening mammogram   Relevant Orders   MM 3D SCREEN BREAST BILATERAL   Return in about 1 year (around 06/10/2023) for annual examination.     Vonna Kotyk, FNP, have reviewed all documentation for this visit. The documentation on 06/09/22 for the exam, diagnosis, procedures, and orders are all accurate and complete.  Rita Foster, [place] [phone] (phone) [phone] (fax)  [place]

## 2022-06-09 ENCOUNTER — Encounter: Payer: Self-pay | Admitting: Family Medicine

## 2022-06-09 ENCOUNTER — Other Ambulatory Visit (HOSPITAL_COMMUNITY)
Admission: RE | Admit: 2022-06-09 | Discharge: 2022-06-09 | Disposition: A | Discharge status: Home / Self Care | Payer: Managed Care, Other (non HMO) | Source: Ambulatory Visit | Attending: Family Medicine | Admitting: Family Medicine

## 2022-06-09 ENCOUNTER — Ambulatory Visit (INDEPENDENT_AMBULATORY_CARE_PROVIDER_SITE_OTHER): Payer: Managed Care, Other (non HMO) | Admitting: Family Medicine

## 2022-06-09 VITALS — BP 138/83 | HR 66 | Temp 98.2°F | Wt 260.0 lb

## 2022-06-09 DIAGNOSIS — B009 Herpesviral infection, unspecified: Secondary | ICD-10-CM | POA: Insufficient documentation

## 2022-06-09 DIAGNOSIS — M25561 Pain in right knee: Secondary | ICD-10-CM

## 2022-06-09 DIAGNOSIS — Z124 Encounter for screening for malignant neoplasm of cervix: Secondary | ICD-10-CM | POA: Diagnosis present

## 2022-06-09 DIAGNOSIS — I10 Essential (primary) hypertension: Secondary | ICD-10-CM | POA: Diagnosis not present

## 2022-06-09 DIAGNOSIS — Z0000 Encounter for general adult medical examination without abnormal findings: Secondary | ICD-10-CM

## 2022-06-09 DIAGNOSIS — M25562 Pain in left knee: Secondary | ICD-10-CM

## 2022-06-09 DIAGNOSIS — G8929 Other chronic pain: Secondary | ICD-10-CM | POA: Insufficient documentation

## 2022-06-09 DIAGNOSIS — Z1231 Encounter for screening mammogram for malignant neoplasm of breast: Secondary | ICD-10-CM | POA: Insufficient documentation

## 2022-06-09 DIAGNOSIS — E7800 Pure hypercholesterolemia, unspecified: Secondary | ICD-10-CM

## 2022-06-09 MED ORDER — LOSARTAN POTASSIUM 100 MG PO TABS: 100.0000 mg | ORAL_TABLET | Freq: Every day | ORAL | 1 refills | Status: DC

## 2022-06-09 MED ORDER — ACYCLOVIR 400 MG PO TABS: ORAL_TABLET | ORAL | 1 refills | Status: DC

## 2022-06-09 NOTE — Assessment & Plan Note (Signed)
Chronic, previously elevated with LDL of 121 Unable to exercise d/t chronic knee pain Repeat LP Not currently on statin; hx of tobacco use in remission

## 2022-06-09 NOTE — Patient Instructions (Signed)
2000 IU Vit D 3 Please call and schedule your mammogram:  [place] [facility]  [place], [place],  [place]  [id number] Get Driving Directions Main: [phone]  Sunday:Closed Monday:7:20 AM - 5:00 PM Tuesday:7:20 AM - 5:00 PM Wednesday:7:20 AM - 5:00 PM Thursday:7:20 AM - 5:00 PM Friday:7:20 AM - 4:30 PM Saturday:Closed

## 2022-06-09 NOTE — Assessment & Plan Note (Signed)
Followed by ortho Sx planned for R knee 07/2022

## 2022-06-09 NOTE — Assessment & Plan Note (Signed)
Chronic, unknown- pt reported weight today Body mass index is 39.53 kg/m. Discussed importance of healthy weight management Discussed diet and exercise

## 2022-06-09 NOTE — Assessment & Plan Note (Signed)
Encouraged dental and vision Due for mammo Due for PAP per pt request Plans to have knee sx with Hooten MD in 07/2022 W/c bound at this time; extreme difficulty getting on table for exam Things to do to keep yourself healthy  - Exercise at least 30-45 minutes a day, 3-4 days a week.  - Eat a low-fat diet with lots of fruits and vegetables, up to 7-9 servings per day.  - Seatbelts can save your life. Wear them always.  - Smoke detectors on every level of your home, check batteries every year.  - Eye Doctor - have an eye exam every 1-2 years  - Safe sex - if you may be exposed to STDs, use a condom.  - Alcohol -  If you drink, do it moderately, less than 2 drinks per day.  - [place]. Choose someone to speak for you if you are not able.  - Depression is common in our stressful world.If you're feeling down or losing interest in things you normally enjoy, please come in for a visit.  - Violence - If anyone is threatening or hurting you, please call immediately.

## 2022-06-09 NOTE — Assessment & Plan Note (Signed)
Chronic, stable Wishes to stay on antivirals to assist Request for refills at this time

## 2022-06-09 NOTE — Assessment & Plan Note (Signed)
Chronic, stable Continue Norvasc 10 mg and Losartan 100 mg Check CBC and CMP Goal <140/<80

## 2022-06-10 LAB — CBC
Hematocrit: 40.3 % (ref 34.0–46.6)
Hemoglobin: 13.1 g/dL (ref 11.1–15.9)
MCH: 29.3 pg (ref 26.6–33.0)
MCHC: 32.5 g/dL (ref 31.5–35.7)
MCV: 90 fL (ref 79–97)
Platelets: 354 10*3/uL (ref 150–450)
RBC: 4.47 x10E6/uL (ref 3.77–5.28)
RDW: 13.4 % (ref 11.7–15.4)
WBC: 8.6 10*3/uL (ref 3.4–10.8)

## 2022-06-10 LAB — COMPREHENSIVE METABOLIC PANEL
ALT: 21 IU/L (ref 0–32)
AST: 23 IU/L (ref 0–40)
Albumin/Globulin Ratio: 1.8 (ref 1.2–2.2)
Albumin: 4.3 g/dL (ref 3.9–4.9)
Alkaline Phosphatase: 75 IU/L (ref 44–121)
BUN/Creatinine Ratio: 25 (ref 12–28)
BUN: 17 mg/dL (ref 8–27)
Bilirubin Total: 0.3 mg/dL (ref 0.0–1.2)
CO2: 23 mmol/L (ref 20–29)
Calcium: 10 mg/dL (ref 8.7–10.3)
Chloride: 102 mmol/L (ref 96–106)
Creatinine, Ser: 0.69 mg/dL (ref 0.57–1.00)
Globulin, Total: 2.4 g/dL (ref 1.5–4.5)
Glucose: 91 mg/dL (ref 70–99)
Potassium: 4.4 mmol/L (ref 3.5–5.2)
Sodium: 139 mmol/L (ref 134–144)
Total Protein: 6.7 g/dL (ref 6.0–8.5)
eGFR: 98 mL/min/{1.73_m2} (ref >59)

## 2022-06-10 LAB — HEMOGLOBIN A1C
Est. average glucose Bld gHb Est-mCnc: 117 mg/dL
Hgb A1c MFr Bld: 5.7 % — ABNORMAL HIGH (ref 4.8–5.6)

## 2022-06-10 LAB — LIPID PANEL
Chol/HDL Ratio: 3.3 ratio (ref 0.0–4.4)
Cholesterol, Total: 212 mg/dL — ABNORMAL HIGH (ref 100–199)
HDL: 65 mg/dL (ref >39)
LDL Chol Calc (NIH): 127 mg/dL — ABNORMAL HIGH (ref 0–99)
Triglycerides: 113 mg/dL (ref 0–149)
VLDL Cholesterol Cal: 20 mg/dL (ref 5–40)

## 2022-06-10 LAB — TSH: TSH: 1.28 u[IU]/mL (ref 0.450–4.500)

## 2022-06-11 NOTE — Progress Notes (Signed)
A1c is improved; continues to show pre-diabetes. Cholesterol is elevated with LDL/Bad cholesterol at 127. I would recommend use of cholesterol medication to assist in the risk reduction of MI and CVA given you cannot work aggressively on exercise at this time. Start Crestor 20 mg if desired. The 10-year ASCVD risk score (Arnett DK, et al., 2019) is: 6%   Values used to calculate the score:     Age: 61 years     Sex: Female     Is Non-Hispanic African American: No     Diabetic: No     Tobacco smoker: No     Systolic Blood Pressure: 374 mmHg     Is BP treated: Yes     HDL Cholesterol: 65 mg/dL     Total Cholesterol: 212 mg/dL

## 2022-06-12 MED ORDER — ROSUVASTATIN CALCIUM 20 MG PO TABS: 20.0000 mg | ORAL_TABLET | Freq: Every day | ORAL | 3 refills | Status: DC

## 2022-06-12 NOTE — Addendum Note (Signed)
Addended by: Smitty Knudsen on: 06/12/2022 11:53 AM   Modules accepted: Orders

## 2022-06-15 LAB — CYTOLOGY - PAP
Chlamydia: NEGATIVE
Comment: NEGATIVE
Comment: NEGATIVE
Comment: NEGATIVE
Comment: NEGATIVE
Comment: NORMAL
Diagnosis: NEGATIVE
HSV1: NEGATIVE
HSV2: NEGATIVE
High risk HPV: NEGATIVE
Neisseria Gonorrhea: NEGATIVE
Trichomonas: NEGATIVE

## 2022-06-15 NOTE — Progress Notes (Signed)
Negative/Normal PAP.

## 2022-06-21 NOTE — Discharge Instructions (Signed)
Instructions after Total Knee Replacement   Rita Foster P. Jadyn Brasher, Jr., M.D.     Dept. of Orthopaedics & Sports Medicine  [facility]  [address]  [place], [place]  [address]  Phone: [phone]   Fax: [phone]    DIET: Drink plenty of non-alcoholic fluids. Resume your normal diet. Include foods high in fiber.  ACTIVITY:  You may use crutches or a walker with weight-bearing as tolerated, unless instructed otherwise. You may be weaned off of the walker or crutches by your Physical Therapist.  Do NOT place pillows under the knee. Anything placed under the knee could limit your ability to straighten the knee.   Continue doing gentle exercises. Exercising will reduce the pain and swelling, increase motion, and prevent muscle weakness.   Please continue to use the TED compression stockings for 6 weeks. You may remove the stockings at night, but should reapply them in the morning. Do not drive or operate any equipment until instructed.  WOUND CARE:  Continue to use the PolarCare or ice packs periodically to reduce pain and swelling. You may bathe or shower after the staples are removed at the first office visit following surgery.  MEDICATIONS: You may resume your regular medications. Please take the pain medication as prescribed on the medication. Do not take pain medication on an empty stomach. You have been given a prescription for a blood thinner (Lovenox or Coumadin). Please take the medication as instructed. (NOTE: After completing a 2 week course of Lovenox, take one Enteric-coated aspirin once a day. This along with elevation will help reduce the possibility of phlebitis in your operated leg.) Do not drive or drink alcoholic beverages when taking pain medications.  CALL THE OFFICE FOR: Temperature above 101 degrees Excessive bleeding or drainage on the dressing. Excessive swelling, coldness, or paleness of the toes. Persistent nausea and vomiting.  FOLLOW-UP:  You  should have an appointment to return to the office in 10-14 days after surgery. Arrangements have been made for continuation of Physical Therapy (either home therapy or outpatient therapy).   [facility] Department Directory         [email or web address]       [email or web address]          Cardiology  Appointments: [place] - [phone] [place] - [phone]  Endocrinology  Appointments: [place] - [phone] [place] - [phone]  Gastroenterology  Appointments: [place] - [phone] [place] - [phone]        General Surgery   Appointments: [place] - [phone]  Internal Medicine/Family Medicine  Appointments: [place] - [phone] [place] - [phone] [place] - [phone]  Metabolic and Weigh Loss Surgery  Appointments: [place] - [phone]        Neurology  Appointments: [place] - [phone] [place] - [phone]  Neurosurgery  Appointments: [place] - [phone]  Obstetrics & Gynecology  Appointments: [place] - [phone] [place] - [phone]        Pediatrics  Appointments: [place] - [phone] [place] - [phone]  Physiatry  Appointments: [place] -[phone]  Physical Therapy  Appointments: [place] - [phone] [place] - [phone]        Podiatry  Appointments: [place] - [phone] [place] - [phone]  Pulmonology  Appointments: [place] - [phone]  Rheumatology  Appointments: [place] - [phone]        [place] Location: [facility]  [address] [place], [place]  [address]  [place] Location: [facility] [address] [place], [place]  [address]  [place] Location: [facility] [address] [place], [place]  [address]    

## 2022-06-22 ENCOUNTER — Other Ambulatory Visit: Payer: Managed Care, Other (non HMO)

## 2022-06-30 ENCOUNTER — Encounter
Admission: RE | Admit: 2022-06-30 | Discharge: 2022-06-30 | Disposition: A | Discharge status: Home / Self Care | Payer: Managed Care, Other (non HMO) | Source: Ambulatory Visit | Attending: Orthopedic Surgery | Admitting: Orthopedic Surgery

## 2022-06-30 VITALS — BP 155/93 | Resp 16 | Ht 68.0 in | Wt 273.9 lb

## 2022-06-30 DIAGNOSIS — Z01810 Encounter for preprocedural cardiovascular examination: Secondary | ICD-10-CM

## 2022-06-30 DIAGNOSIS — Z01818 Encounter for other preprocedural examination: Secondary | ICD-10-CM | POA: Diagnosis present

## 2022-06-30 DIAGNOSIS — M1711 Unilateral primary osteoarthritis, right knee: Secondary | ICD-10-CM

## 2022-06-30 DIAGNOSIS — R7303 Prediabetes: Secondary | ICD-10-CM | POA: Diagnosis not present

## 2022-06-30 DIAGNOSIS — Z880 Allergy status to penicillin: Secondary | ICD-10-CM

## 2022-06-30 DIAGNOSIS — E7800 Pure hypercholesterolemia, unspecified: Secondary | ICD-10-CM

## 2022-06-30 HISTORY — DX: Hyperlipidemia, unspecified: E78.5

## 2022-06-30 HISTORY — DX: Prediabetes: R73.03

## 2022-06-30 HISTORY — DX: Unspecified asthma, uncomplicated: J45.909

## 2022-06-30 HISTORY — DX: Essential (primary) hypertension: I10

## 2022-06-30 LAB — URINALYSIS, ROUTINE W REFLEX MICROSCOPIC
Bacteria, UA: NONE SEEN
Bilirubin Urine: NEGATIVE
Glucose, UA: NEGATIVE mg/dL
Hgb urine dipstick: NEGATIVE
Ketones, ur: 5 mg/dL — AB
Nitrite: NEGATIVE
Protein, ur: NEGATIVE mg/dL
Specific Gravity, Urine: 1.024 (ref 1.005–1.030)
pH: 5 (ref 5.0–8.0)

## 2022-06-30 LAB — SEDIMENTATION RATE: Sed Rate: 16 mm/hr (ref 0–30)

## 2022-06-30 LAB — SURGICAL PCR SCREEN
MRSA, PCR: NEGATIVE
Staphylococcus aureus: NEGATIVE

## 2022-06-30 LAB — TYPE AND SCREEN
ABO/RH(D): O POS
Antibody Screen: NEGATIVE

## 2022-06-30 LAB — C-REACTIVE PROTEIN: CRP: 0.6 mg/dL (ref <1.0)

## 2022-06-30 NOTE — Patient Instructions (Addendum)
Your procedure is scheduled on:07-12-22 Wednesday Report to the Registration Desk on the 1st floor of the [facility].Then proceed to the 2nd floor Surgery Desk  To find out your arrival time, please call [phone] between 1PM - 3PM on:07-11-22 Tuesday If your arrival time is 6:00 am, do not arrive prior to that time as the [facility] entrance doors do not open until 6:00 am.  REMEMBER: Instructions that are not followed completely may result in serious medical risk, up to and including death; or upon the discretion of your surgeon and anesthesiologist your surgery may need to be rescheduled.  Do not eat food after midnight the night before surgery.  No gum chewing, lozengers or hard candies.  You may however, drink CLEAR liquids up to 2 hours before you are scheduled to arrive for your surgery. Do not drink anything within 2 hours of your scheduled arrival time.  Clear liquids include: - water  - apple juice without pulp - gatorade (not RED colors) - black coffee or tea (Do NOT add milk or creamers to the coffee or tea) Do NOT drink anything that is not on this list.  In addition, your doctor has ordered for you to drink the provided  Ensure Pre-Surgery Clear Carbohydrate Drink  Drinking this carbohydrate drink up to two hours before surgery helps to reduce insulin resistance and improve patient outcomes. Please complete drinking 2 hours prior to scheduled arrival time.  TAKE THESE MEDICATIONS THE MORNING OF SURGERY WITH A SIP OF WATER: -amLODipine (NORVASC)  -rosuvastatin (CRESTOR)  -acyclovir (ZOVIRAX)   Use your Albuterol Inhaler the day of surgery and bring your Albuterol Inhaler to the hospital  One week prior to surgery: Stop Anti-inflammatories (NSAIDS) such as diclofenac (VOLTAREN) ,Advil, Aleve, Ibuprofen, Motrin, Naproxen, Naprosyn and Aspirin based products such as Excedrin, Goodys Powder, BC Powder.You may however, continue to take Tylenol if needed for pain up until  the day of surgery.  Stop ANY OVER THE COUNTER supplements/vitamins 7 days prior to surgery (Biotin, Osteo-Bi-Flex, Multiple Vitamin, Green Lipped Mussel Oil, Probiotic)  No Alcohol for 24 hours before or after surgery.  No Smoking including e-cigarettes for 24 hours prior to surgery.  No chewable tobacco products for at least 6 hours prior to surgery.  No nicotine patches on the day of surgery.  Do not use any "recreational" drugs for at least a week prior to your surgery.  Please be advised that the combination of cocaine and anesthesia may have negative outcomes, up to and including death. If you test positive for cocaine, your surgery will be cancelled.  On the morning of surgery brush your teeth with toothpaste and water, you may rinse your mouth with mouthwash if you wish. Do not swallow any toothpaste or mouthwash.  Use CHG Soap as directed on instruction sheet.  Do not wear jewelry, make-up, hairpins, clips or nail polish.  Do not wear lotions, powders, or perfumes.   Do not shave body from the neck down 48 hours prior to surgery just in case you cut yourself which could leave a site for infection.  Also, freshly shaved skin may become irritated if using the CHG soap.  Contact lenses, hearing aids and dentures may not be worn into surgery.  Do not bring valuables to the hospital. [facility] is not responsible for any missing/lost belongings or valuables.   Notify your doctor if there is any change in your medical condition (cold, fever, infection).  Wear comfortable clothing (specific to your surgery  type) to the hospital.  After surgery, you can help prevent lung complications by doing breathing exercises.  Take deep breaths and cough every 1-2 hours. Your doctor may order a device called an Incentive Spirometer to help you take deep breaths. When coughing or sneezing, hold a pillow firmly against your incision with both hands. This is called "splinting." Doing this  helps protect your incision. It also decreases belly discomfort.  If you are being admitted to the hospital overnight, leave your suitcase in the car. After surgery it may be brought to your room.  If you are being discharged the day of surgery, you will not be allowed to drive home. You will need a responsible adult (18 years or older) to drive you home and stay with you that night.   If you are taking public transportation, you will need to have a responsible adult (18 years or older) with you. Please confirm with your physician that it is acceptable to use public transportation.   Please call the [facility] Dept. at [phone] if you have any questions about these instructions.  Surgery Visitation Policy:  Patients undergoing a surgery or procedure may have two family members or support persons with them as long as the person is not COVID-19 positive or experiencing its symptoms.   Inpatient Visitation:    Visiting hours are 7 a.m. to 8 p.m. Up to four visitors are allowed at one time in a patient room. The visitors may rotate out with other people during the day. One designated support person (adult) may remain overnight.  Due to an increase in RSV and influenza rates and associated hospitalizations, children ages 53 and under will not be able to visit patients in [facility]. Masks continue to be strongly recommended.   How to Use an Incentive Spirometer An incentive spirometer is a tool that measures how well you are filling your lungs with each breath. Learning to take long, deep breaths using this tool can help you keep your lungs clear and active. This may help to reverse or lessen your chance of developing breathing (pulmonary) problems, especially infection. You may be asked to use a spirometer: After a surgery. If you have a lung problem or a history of smoking. After a long period of time when you have been unable to move or be active. If the  spirometer includes an indicator to show the highest number that you have reached, your health care provider or respiratory therapist will help you set a goal. Keep a log of your progress as told by your health care provider. What are the risks? Breathing too quickly may cause dizziness or cause you to pass out. Take your time so you do not get dizzy or light-headed. If you are in pain, you may need to take pain medicine before doing incentive spirometry. It is harder to take a deep breath if you are having pain. How to use your incentive spirometer  Sit up on the edge of your bed or on a chair. Hold the incentive spirometer so that it is in an upright position. Before you use the spirometer, breathe out normally. Place the mouthpiece in your mouth. Make sure your lips are closed tightly around it. Breathe in slowly and as deeply as you can through your mouth, causing the piston or the ball to rise toward the top of the chamber. Hold your breath for 3-5 seconds, or for as long as possible. If the spirometer includes a coach indicator,  use this to guide you in breathing. Slow down your breathing if the indicator goes above the marked areas. Remove the mouthpiece from your mouth and breathe out normally. The piston or ball will return to the bottom of the chamber. Rest for a few seconds, then repeat the steps 10 or more times. Take your time and take a few normal breaths between deep breaths so that you do not get dizzy or light-headed. Do this every 1-2 hours when you are awake. If the spirometer includes a goal marker to show the highest number you have reached (best effort), use this as a goal to work toward during each repetition. After each set of 10 deep breaths, cough a few times. This will help to make sure that your lungs are clear. If you have an incision on your chest or abdomen from surgery, place a pillow or a rolled-up towel firmly against the incision when you cough. This can help to  reduce pain while taking deep breaths and coughing. General tips When you are able to get out of bed: Walk around often. Continue to take deep breaths and cough in order to clear your lungs. Keep using the incentive spirometer until your health care provider says it is okay to stop using it. If you have been in the hospital, you may be told to keep using the spirometer at home. Contact a health care provider if: You are having difficulty using the spirometer. You have trouble using the spirometer as often as instructed. Your pain medicine is not giving enough relief for you to use the spirometer as told. You have a fever. Get help right away if: You develop shortness of breath. You develop a cough with bloody mucus from the lungs. You have fluid or blood coming from an incision site after you cough. Summary An incentive spirometer is a tool that can help you learn to take long, deep breaths to keep your lungs clear and active. You may be asked to use a spirometer after a surgery, if you have a lung problem or a history of smoking, or if you have been inactive for a long period of time. Use your incentive spirometer as instructed every 1-2 hours while you are awake. If you have an incision on your chest or abdomen, place a pillow or a rolled-up towel firmly against your incision when you cough. This will help to reduce pain. Get help right away if you have shortness of breath, you cough up bloody mucus, or blood comes from your incision when you cough. This information is not intended to replace advice given to you by your health care provider. Make sure you discuss any questions you have with your health care provider. Document Revised: 09/08/2019 Document Reviewed: 09/08/2019 Elsevier Patient Education  [place].

## 2022-07-03 ENCOUNTER — Encounter: Payer: Self-pay | Admitting: Orthopedic Surgery

## 2022-07-03 LAB — IGE: IgE (Immunoglobulin E), Serum: 89 IU/mL (ref 6–495)

## 2022-07-09 NOTE — H&P (Signed)
ORTHOPAEDIC HISTORY & PHYSICAL Gwenlyn Fudge, [place] - 06/30/2022 10:45 AM EST Formatting of this note is different from the original. [facility] MEDICINE Chief Complaint:  Chief Complaint Patient presents with Knee Pain H & P RIGHT KNEE  History of Present Illness:  Rita Foster is a 62 y.o. female that presents to clinic today for her preoperative history and evaluation. Patient presents unaccompanied. The patient is scheduled to undergo a right total knee arthroplasty on 07/12/22 by Dr. Marry Guan. Her pain began many years ago. The pain is located along the medial aspect of the knee. She describes her pain as worse with weightbearing. She reports associated swelling with significant giving way of the knee. She denies associated numbness or tingling, denies locking.  The patient's symptoms have progressed to the point that they decrease her quality of life. The patient has previously undergone conservative treatment including NSAIDS, Tylenol, ambulatory aids, and injections to the knee without adequate control of her symptoms.  Denies history of lumbar surgery, DVT, or significant cardiac history.  Patient only has her father at home to assist post-operatively. She has her neighbor that can help if needed.  Past Medical, Surgical, Family, Social History, Allergies, Medications:  Past Medical History: Past Medical History: Diagnosis Date Asthma 09/14/2006 AVN (avascular necrosis of bone) (CMS-HCC) 02/26/2022 Elevated BUN 06/01/2021 GERD (gastroesophageal reflux disease) Hypercholesteremia 04/02/2015 Hypertension Osteoarthritis  Past Surgical History: Past Surgical History: Procedure Laterality Date Tonsillectomy and Adnoidectomy 1968 Breat Biopsy 2002 Hernia Repair 02/21/2010 Cataract extraction Right 2016 Breat Biopsy Left HERNIA REPAIR TONSILLECTOMY  Current Medications: Current Outpatient Medications Medication Sig Dispense  Refill acetaminophen (TYLENOL) 650 MG ER tablet Take 1,300 mg by mouth 2 (two) times daily acyclovir (ZOVIRAX) 400 MG tablet Take 400 mg by mouth once daily amLODIPine (NORVASC) 10 MG tablet Take 1 tablet by mouth once daily B.coagul,subtilis-inulin-vit C (UP4 PROBIOTICS PLUS PREBIOTIC) 1 billion cell- 1 gram-15 mg Chew Take 1 g by mouth once daily Gummy diclofenac (VOLTAREN) 1 % topical gel Apply 2 g topically 3 (three) times daily losartan (COZAAR) 100 MG tablet Take 100 mg by mouth once daily meclizine (ANTIVERT) 12.5 mg tablet Take 12.5 mg by mouth 3 (three) times daily as needed for Dizziness multivitamin tablet Take 1 tablet by mouth once daily Centrum Silver Multivitamin mv,cal,iron,mn/folic acid/chol (HAIR-SKIN-NAILS, PABA, ORAL) Take 2 tablets by mouth once daily naproxen sodium (ALEVE) 220 MG tablet Take 440 mg by mouth 2 (two) times daily with meals omega-3 fatty acids-fish oil 300-1,000 mg capsule Take 1 capsule by mouth once daily Green alift mussel- omega rosuvastatin (CRESTOR) 20 MG tablet Take 20 mg by mouth once daily rutin/hesp/bioflav/C/herbal196 (BIOFLEX ORAL) Take 2 tablets by mouth once daily With turmeric traZODone (DESYREL) 50 MG tablet Take by mouth Take 0.5-2 tablets (25-100 mg total) by mouth at bedtime as needed for sleep tretinoin (RETIN-A) 0.025 % cream Apply topically at bedtime For acne at face. Can use followed by moisturizer if irritating. albuterol 90 mcg/actuation inhaler Inhale 1 inhalation into the lungs every 6 (six) hours as needed lidocaine (LIDODERM) 5 % patch Place 1 patch onto the skin daily Apply patch to the most painful area for up to 12 hours in a 24 hour period. 30 patch 1  No current facility-administered medications for this visit.  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 Tobacco Use Smoking status: Former Packs/day: 0.50 Years: 10.00 Additional pack years: 0.00 Total pack years: 5.00 Types: Cigarettes Quit date: 07/03/1999 Years since quitting: 23.0 Smokeless tobacco: Never Vaping Use Vaping Use: Never used Substance and Sexual Activity Alcohol use: Not Currently Drug use: Never Sexual activity: Not Currently Partners: Male  Family History: Family History Problem Relation Age of Onset Breast cancer Mother Crohn's disease Mother Rheum arthritis Mother Thyroid disease Mother High blood pressure (Hypertension) Father Skin cancer Father Coronary Artery Disease (Blocked arteries around heart) Maternal Grandfather  Review of Systems:  A 10+ ROS was performed, reviewed, and the pertinent orthopaedic findings are documented in the HPI.  Physical Examination:  BP (!) 140/90 (BP Location: Left upper arm, Patient Position: Sitting, BP Cuff Size: Large Adult)  Ht 172.7 cm ('5\' 8"'$ )  Wt (!) 123.8 kg (273 lb)  BMI 41.51 kg/m  Patient is a well-developed, well-nourished female in no acute distress. Patient has normal mood and affect. Patient is alert and oriented to person, place, and time.  HEENT: Atraumatic, normocephalic. Pupils equal and reactive to light. Extraocular motion intact. Noninjected sclera.  Cardiovascular: Regular rate and rhythm, with no murmurs, rubs, or gallops. Distal pulses palpable.  Respiratory: Lungs clear to auscultation bilaterally.  Right Knee: Soft tissue swelling: mild Effusion: minimal Erythema: none Crepitance: mild Tenderness: medial Alignment: relative varus Mediolateral laxity: medial pseudolaxity Posterior sag: negative Patellar tracking: Good tracking without evidence of  subluxation or tilt Atrophy: Generalized quadriceps atrophy. Quadriceps tone was fair. Range of motion: 0/15/102 degrees  Able to plantarflex and dorsiflex the ankle. Able to flex and extend the toes.  Sensation intact over the saphenous, lateral sural cutaneous, superficial fibular, and deep fibular nerve distributions.  Tests Performed/Reviewed: X-rays  3 views of the right knee were obtained. Images reveal severe loss of medial compartment joint space with bone-on-bone contact and significant osteophyte formation. No fractures or dislocations. No osseous abnormality noted.  I personally ordered and interpreted today's x-rays.  Impression:  ICD-10-CM 1. Primary osteoarthritis of right knee M17.11  Plan:  The patient has end-stage degenerative changes of the right knee. It was explained to the patient that the condition is progressive in nature. Having failed conservative treatment, the patient has elected to proceed with a total joint arthroplasty. The patient will undergo a total joint arthroplasty with Dr. Marry Guan. The risks of surgery, including blood clot and infection, were discussed with the patient. Measures to reduce these risks, including the use of anticoagulation, perioperative antibiotics, and early ambulation were discussed. The importance of postoperative physical therapy was discussed with the patient. The patient elects to proceed with surgery. The patient is instructed to stop all blood thinners prior to surgery. The patient is instructed to call the hospital the day before surgery to learn of the proper arrival time.  Contact our office with any questions or concerns. Follow up as indicated, or sooner should any new problems arise, if conditions worsen, or if they are otherwise concerned.  Gwenlyn Fudge, [facility] and [facility] Medicine [place], [place] [address] Phone: [phone]  This note was generated in part with  voice recognition software and I apologize for any typographical errors that were not detected and corrected. Electronically signed by Gwenlyn Fudge, PA at 06/30/2022 1:29 PM EST

## 2022-07-12 ENCOUNTER — Observation Stay: Payer: Managed Care, Other (non HMO)

## 2022-07-12 ENCOUNTER — Observation Stay
Admission: RE | Admit: 2022-07-12 | Discharge: 2022-07-14 | Disposition: A | Discharge status: Home / Self Care | Payer: Managed Care, Other (non HMO) | Attending: Orthopedic Surgery | Admitting: Orthopedic Surgery

## 2022-07-12 ENCOUNTER — Other Ambulatory Visit: Payer: Self-pay

## 2022-07-12 ENCOUNTER — Encounter: Payer: Self-pay | Admitting: Orthopedic Surgery

## 2022-07-12 ENCOUNTER — Encounter
Admission: RE | Disposition: A | Discharge status: Home / Self Care | Payer: Self-pay | Source: Home / Self Care | Attending: Orthopedic Surgery

## 2022-07-12 ENCOUNTER — Ambulatory Visit: Payer: Managed Care, Other (non HMO) | Admitting: Anesthesiology

## 2022-07-12 DIAGNOSIS — Z79899 Other long term (current) drug therapy: Secondary | ICD-10-CM | POA: Diagnosis not present

## 2022-07-12 DIAGNOSIS — Z96659 Presence of unspecified artificial knee joint: Secondary | ICD-10-CM

## 2022-07-12 DIAGNOSIS — Z87891 Personal history of nicotine dependence: Secondary | ICD-10-CM | POA: Diagnosis not present

## 2022-07-12 DIAGNOSIS — E7800 Pure hypercholesterolemia, unspecified: Secondary | ICD-10-CM

## 2022-07-12 DIAGNOSIS — Z880 Allergy status to penicillin: Secondary | ICD-10-CM

## 2022-07-12 DIAGNOSIS — Z85828 Personal history of other malignant neoplasm of skin: Secondary | ICD-10-CM | POA: Diagnosis not present

## 2022-07-12 DIAGNOSIS — R7303 Prediabetes: Secondary | ICD-10-CM

## 2022-07-12 DIAGNOSIS — M1711 Unilateral primary osteoarthritis, right knee: Secondary | ICD-10-CM | POA: Diagnosis present

## 2022-07-12 DIAGNOSIS — J45909 Unspecified asthma, uncomplicated: Secondary | ICD-10-CM | POA: Diagnosis not present

## 2022-07-12 DIAGNOSIS — I10 Essential (primary) hypertension: Secondary | ICD-10-CM | POA: Insufficient documentation

## 2022-07-12 DIAGNOSIS — Z96651 Presence of right artificial knee joint: Secondary | ICD-10-CM

## 2022-07-12 HISTORY — PX: KNEE ARTHROPLASTY: SHX992

## 2022-07-12 LAB — ABO/RH: ABO/RH(D): O POS

## 2022-07-12 LAB — GLUCOSE, CAPILLARY
Glucose-Capillary: 81 mg/dL (ref 70–99)
Glucose-Capillary: 107 mg/dL — ABNORMAL HIGH (ref 70–99)

## 2022-07-12 SURGERY — ARTHROPLASTY, KNEE, TOTAL, USING IMAGELESS COMPUTER-ASSISTED NAVIGATION
Anesthesia: Spinal | Site: Knee | Laterality: Right

## 2022-07-12 MED ORDER — ALUM & MAG HYDROXIDE-SIMETH 200-200-20 MG/5ML PO SUSP: 30.0000 mL | ORAL | Status: DC | PRN

## 2022-07-12 MED ORDER — TRANEXAMIC ACID-NACL 1000-0.7 MG/100ML-% IV SOLN: 1000.0000 mg | INTRAVENOUS | Status: DC

## 2022-07-12 MED ORDER — CEFAZOLIN SODIUM-DEXTROSE 2-4 GM/100ML-% IV SOLN
2.0000 g | Freq: Four times a day (QID) | INTRAVENOUS | Status: AC
  Administered 2022-07-12 (×2): 2 g via INTRAVENOUS
  Filled 2022-07-12 (×2): qty 100

## 2022-07-12 MED ORDER — PHENYLEPHRINE HCL (PRESSORS) 10 MG/ML IV SOLN
INTRAVENOUS | Status: AC
  Filled 2022-07-12: qty 1

## 2022-07-12 MED ORDER — PHENOL 1.4 % MT LIQD: 1.0000 | OROMUCOSAL | Status: DC | PRN

## 2022-07-12 MED ORDER — CHLORHEXIDINE GLUCONATE 4 % EX LIQD: 60.0000 mL | Freq: Once | CUTANEOUS | Status: DC

## 2022-07-12 MED ORDER — CHLORHEXIDINE GLUCONATE 0.12 % MT SOLN
OROMUCOSAL | Status: AC
  Administered 2022-07-12: 15 mL via OROMUCOSAL
  Filled 2022-07-12: qty 15

## 2022-07-12 MED ORDER — FENTANYL CITRATE (PF) 100 MCG/2ML IJ SOLN
INTRAMUSCULAR | Status: AC
  Administered 2022-07-12: 50 ug via INTRAVENOUS
  Filled 2022-07-12: qty 2

## 2022-07-12 MED ORDER — ROSUVASTATIN CALCIUM 20 MG PO TABS
20.0000 mg | ORAL_TABLET | ORAL | Status: DC
  Administered 2022-07-13 – 2022-07-14 (×2): 20 mg via ORAL
  Filled 2022-07-12 (×2): qty 1

## 2022-07-12 MED ORDER — FENTANYL CITRATE (PF) 100 MCG/2ML IJ SOLN: 25.0000 ug | INTRAMUSCULAR | Status: DC | PRN

## 2022-07-12 MED ORDER — DEXTROSE 5 % IV SOLN
INTRAVENOUS | Status: DC | PRN
  Administered 2022-07-12: 3 g via INTRAVENOUS

## 2022-07-12 MED ORDER — PROPOFOL 1000 MG/100ML IV EMUL
INTRAVENOUS | Status: AC
  Filled 2022-07-12: qty 100

## 2022-07-12 MED ORDER — TRANEXAMIC ACID-NACL 1000-0.7 MG/100ML-% IV SOLN
INTRAVENOUS | Status: AC
  Administered 2022-07-12: 1000 mg via INTRAVENOUS
  Filled 2022-07-12: qty 100

## 2022-07-12 MED ORDER — PHENYLEPHRINE HCL-NACL 20-0.9 MG/250ML-% IV SOLN
INTRAVENOUS | Status: DC | PRN
  Administered 2022-07-12: 50 ug/min via INTRAVENOUS

## 2022-07-12 MED ORDER — CELECOXIB 200 MG PO CAPS
ORAL_CAPSULE | ORAL | Status: AC
  Administered 2022-07-12: 400 mg via ORAL
  Filled 2022-07-12: qty 2

## 2022-07-12 MED ORDER — PANTOPRAZOLE SODIUM 40 MG PO TBEC
40.0000 mg | DELAYED_RELEASE_TABLET | Freq: Two times a day (BID) | ORAL | Status: DC
  Administered 2022-07-12 – 2022-07-14 (×4): 40 mg via ORAL
  Filled 2022-07-12 (×4): qty 1

## 2022-07-12 MED ORDER — BUPIVACAINE HCL (PF) 0.25 % IJ SOLN
INTRAMUSCULAR | Status: AC
  Filled 2022-07-12: qty 120

## 2022-07-12 MED ORDER — DEXAMETHASONE SODIUM PHOSPHATE 10 MG/ML IJ SOLN: 8.0000 mg | Freq: Once | INTRAMUSCULAR | Status: AC

## 2022-07-12 MED ORDER — OXYCODONE HCL 5 MG/5ML PO SOLN: 5.0000 mg | Freq: Once | ORAL | Status: AC | PRN

## 2022-07-12 MED ORDER — MENTHOL 3 MG MT LOZG: 1.0000 | LOZENGE | OROMUCOSAL | Status: DC | PRN

## 2022-07-12 MED ORDER — MAGNESIUM HYDROXIDE 400 MG/5ML PO SUSP
30.0000 mL | Freq: Every day | ORAL | Status: DC
  Administered 2022-07-12 – 2022-07-14 (×3): 30 mL via ORAL
  Filled 2022-07-12 (×3): qty 30

## 2022-07-12 MED ORDER — LOSARTAN POTASSIUM 50 MG PO TABS
100.0000 mg | ORAL_TABLET | ORAL | Status: DC
  Administered 2022-07-13 – 2022-07-14 (×2): 100 mg via ORAL
  Filled 2022-07-12 (×2): qty 2

## 2022-07-12 MED ORDER — FAMOTIDINE 20 MG PO TABS
ORAL_TABLET | ORAL | Status: AC
  Administered 2022-07-12: 20 mg via ORAL
  Filled 2022-07-12: qty 1

## 2022-07-12 MED ORDER — ACETAMINOPHEN 325 MG PO TABS
325.0000 mg | ORAL_TABLET | Freq: Four times a day (QID) | ORAL | Status: DC | PRN
  Administered 2022-07-14: 325 mg via ORAL
  Filled 2022-07-12: qty 1

## 2022-07-12 MED ORDER — CHLORHEXIDINE GLUCONATE 0.12 % MT SOLN: 15.0000 mL | Freq: Once | OROMUCOSAL | Status: AC

## 2022-07-12 MED ORDER — METOCLOPRAMIDE HCL 10 MG PO TABS
10.0000 mg | ORAL_TABLET | Freq: Three times a day (TID) | ORAL | Status: AC
  Administered 2022-07-12 – 2022-07-14 (×8): 10 mg via ORAL
  Filled 2022-07-12 (×16): qty 1

## 2022-07-12 MED ORDER — GABAPENTIN 300 MG PO CAPS
ORAL_CAPSULE | ORAL | Status: AC
  Administered 2022-07-12: 300 mg via ORAL
  Filled 2022-07-12: qty 1

## 2022-07-12 MED ORDER — ACETAMINOPHEN 10 MG/ML IV SOLN
INTRAVENOUS | Status: DC | PRN
  Administered 2022-07-12: 1000 mg via INTRAVENOUS

## 2022-07-12 MED ORDER — BISACODYL 10 MG RE SUPP: 10.0000 mg | Freq: Every day | RECTAL | Status: DC | PRN

## 2022-07-12 MED ORDER — LACTATED RINGERS IV SOLN: INTRAVENOUS | Status: DC

## 2022-07-12 MED ORDER — SODIUM CHLORIDE (PF) 0.9 % IJ SOLN
INTRAMUSCULAR | Status: DC | PRN
  Administered 2022-07-12: 120 mL via INTRAMUSCULAR

## 2022-07-12 MED ORDER — GABAPENTIN 300 MG PO CAPS: 300.0000 mg | ORAL_CAPSULE | Freq: Once | ORAL | Status: AC

## 2022-07-12 MED ORDER — FAMOTIDINE 20 MG PO TABS: 20.0000 mg | ORAL_TABLET | Freq: Once | ORAL | Status: AC

## 2022-07-12 MED ORDER — TRAZODONE HCL 50 MG PO TABS
50.0000 mg | ORAL_TABLET | Freq: Every day | ORAL | Status: DC
  Administered 2022-07-12: 50 mg via ORAL
  Filled 2022-07-12 (×2): qty 1

## 2022-07-12 MED ORDER — FERROUS SULFATE 325 (65 FE) MG PO TABS
325.0000 mg | ORAL_TABLET | Freq: Two times a day (BID) | ORAL | Status: DC
  Administered 2022-07-12 – 2022-07-14 (×4): 325 mg via ORAL
  Filled 2022-07-12 (×4): qty 1

## 2022-07-12 MED ORDER — CELECOXIB 200 MG PO CAPS
200.0000 mg | ORAL_CAPSULE | Freq: Two times a day (BID) | ORAL | Status: DC
  Administered 2022-07-12 – 2022-07-14 (×4): 200 mg via ORAL
  Filled 2022-07-12 (×4): qty 1

## 2022-07-12 MED ORDER — ONDANSETRON HCL 4 MG PO TABS
4.0000 mg | ORAL_TABLET | Freq: Four times a day (QID) | ORAL | Status: DC | PRN
  Administered 2022-07-14: 4 mg via ORAL
  Filled 2022-07-12: qty 1

## 2022-07-12 MED ORDER — BUPIVACAINE LIPOSOME 1.3 % IJ SUSP
INTRAMUSCULAR | Status: AC
  Filled 2022-07-12: qty 40

## 2022-07-12 MED ORDER — ONDANSETRON HCL 4 MG/2ML IJ SOLN: 4.0000 mg | Freq: Four times a day (QID) | INTRAMUSCULAR | Status: DC | PRN

## 2022-07-12 MED ORDER — CEFAZOLIN SODIUM-DEXTROSE 2-4 GM/100ML-% IV SOLN
INTRAVENOUS | Status: AC
  Filled 2022-07-12: qty 100

## 2022-07-12 MED ORDER — AMLODIPINE BESYLATE 5 MG PO TABS
10.0000 mg | ORAL_TABLET | ORAL | Status: DC
  Administered 2022-07-13 – 2022-07-14 (×2): 10 mg via ORAL
  Filled 2022-07-12 (×2): qty 2

## 2022-07-12 MED ORDER — DIPHENHYDRAMINE HCL 12.5 MG/5ML PO ELIX: 12.5000 mg | ORAL_SOLUTION | ORAL | Status: DC | PRN

## 2022-07-12 MED ORDER — ACETAMINOPHEN 10 MG/ML IV SOLN
1000.0000 mg | Freq: Four times a day (QID) | INTRAVENOUS | Status: AC
  Administered 2022-07-12 – 2022-07-13 (×3): 1000 mg via INTRAVENOUS
  Filled 2022-07-12 (×3): qty 100

## 2022-07-12 MED ORDER — DEXAMETHASONE SODIUM PHOSPHATE 10 MG/ML IJ SOLN
INTRAMUSCULAR | Status: AC
  Administered 2022-07-12: 8 mg via INTRAVENOUS
  Filled 2022-07-12: qty 1

## 2022-07-12 MED ORDER — IRRISEPT - 450ML BOTTLE WITH 0.05% CHG IN STERILE WATER, USP 99.95% OPTIME
TOPICAL | Status: DC | PRN
  Administered 2022-07-12: 450 mL

## 2022-07-12 MED ORDER — SODIUM CHLORIDE 0.9 % IV SOLN: INTRAVENOUS | Status: DC

## 2022-07-12 MED ORDER — FLORANEX PO PACK
1.0000 g | PACK | Freq: Every day | ORAL | Status: DC
  Filled 2022-07-12 (×2): qty 1

## 2022-07-12 MED ORDER — TRANEXAMIC ACID-NACL 1000-0.7 MG/100ML-% IV SOLN
INTRAVENOUS | Status: AC
  Filled 2022-07-12: qty 100

## 2022-07-12 MED ORDER — OXYCODONE HCL 5 MG PO TABS
5.0000 mg | ORAL_TABLET | Freq: Once | ORAL | Status: AC | PRN
  Administered 2022-07-12: 5 mg via ORAL

## 2022-07-12 MED ORDER — DROPERIDOL 2.5 MG/ML IJ SOLN: 0.6250 mg | Freq: Once | INTRAMUSCULAR | Status: DC | PRN

## 2022-07-12 MED ORDER — ALBUTEROL SULFATE (2.5 MG/3ML) 0.083% IN NEBU: 3.0000 mL | INHALATION_SOLUTION | Freq: Four times a day (QID) | RESPIRATORY_TRACT | Status: DC | PRN

## 2022-07-12 MED ORDER — CELECOXIB 200 MG PO CAPS: 400.0000 mg | ORAL_CAPSULE | Freq: Once | ORAL | Status: AC

## 2022-07-12 MED ORDER — TRAMADOL HCL 50 MG PO TABS
50.0000 mg | ORAL_TABLET | ORAL | Status: DC | PRN
  Administered 2022-07-12 – 2022-07-13 (×2): 100 mg via ORAL
  Filled 2022-07-12 (×2): qty 2

## 2022-07-12 MED ORDER — OXYCODONE HCL 5 MG PO TABS
ORAL_TABLET | ORAL | Status: AC
  Filled 2022-07-12: qty 1

## 2022-07-12 MED ORDER — MIDAZOLAM HCL 5 MG/5ML IJ SOLN
INTRAMUSCULAR | Status: DC | PRN
  Administered 2022-07-12: 2 mg via INTRAVENOUS

## 2022-07-12 MED ORDER — FLEET ENEMA 7-19 GM/118ML RE ENEM: 1.0000 | ENEMA | Freq: Once | RECTAL | Status: DC | PRN

## 2022-07-12 MED ORDER — TRANEXAMIC ACID-NACL 1000-0.7 MG/100ML-% IV SOLN
INTRAVENOUS | Status: DC | PRN
  Administered 2022-07-12: 1000 mg via INTRAVENOUS

## 2022-07-12 MED ORDER — ACETAMINOPHEN 10 MG/ML IV SOLN: 1000.0000 mg | Freq: Once | INTRAVENOUS | Status: DC | PRN

## 2022-07-12 MED ORDER — MIDAZOLAM HCL 2 MG/2ML IJ SOLN
INTRAMUSCULAR | Status: AC
  Filled 2022-07-12: qty 2

## 2022-07-12 MED ORDER — PROPOFOL 500 MG/50ML IV EMUL
INTRAVENOUS | Status: DC | PRN
  Administered 2022-07-12: 200 ug/kg/min via INTRAVENOUS

## 2022-07-12 MED ORDER — OXYCODONE HCL 5 MG PO TABS
5.0000 mg | ORAL_TABLET | ORAL | Status: DC | PRN
  Filled 2022-07-12: qty 1

## 2022-07-12 MED ORDER — GLYCOPYRROLATE 0.2 MG/ML IJ SOLN
INTRAMUSCULAR | Status: DC | PRN
  Administered 2022-07-12 (×2): .2 mg via INTRAVENOUS

## 2022-07-12 MED ORDER — ONDANSETRON HCL 4 MG/2ML IJ SOLN
INTRAMUSCULAR | Status: DC | PRN
  Administered 2022-07-12: 4 mg via INTRAVENOUS

## 2022-07-12 MED ORDER — ORAL CARE MOUTH RINSE: 15.0000 mL | Freq: Once | OROMUCOSAL | Status: AC

## 2022-07-12 MED ORDER — SODIUM CHLORIDE FLUSH 0.9 % IV SOLN
INTRAVENOUS | Status: AC
  Filled 2022-07-12: qty 80

## 2022-07-12 MED ORDER — ENOXAPARIN SODIUM 30 MG/0.3ML IJ SOSY
30.0000 mg | PREFILLED_SYRINGE | Freq: Two times a day (BID) | INTRAMUSCULAR | Status: DC
  Administered 2022-07-13 – 2022-07-14 (×3): 30 mg via SUBCUTANEOUS
  Filled 2022-07-12 (×3): qty 0.3

## 2022-07-12 MED ORDER — BUPIVACAINE HCL (PF) 0.5 % IJ SOLN
INTRAMUSCULAR | Status: DC | PRN
  Administered 2022-07-12: 3 mL

## 2022-07-12 MED ORDER — ACYCLOVIR 200 MG PO CAPS
400.0000 mg | ORAL_CAPSULE | Freq: Two times a day (BID) | ORAL | Status: DC
  Administered 2022-07-12 – 2022-07-14 (×4): 400 mg via ORAL
  Filled 2022-07-12 (×4): qty 2

## 2022-07-12 MED ORDER — SENNOSIDES-DOCUSATE SODIUM 8.6-50 MG PO TABS
1.0000 | ORAL_TABLET | Freq: Two times a day (BID) | ORAL | Status: DC
  Administered 2022-07-12 – 2022-07-14 (×4): 1 via ORAL
  Filled 2022-07-12 (×4): qty 1

## 2022-07-12 MED ORDER — PROMETHAZINE HCL 25 MG/ML IJ SOLN: 6.2500 mg | INTRAMUSCULAR | Status: DC | PRN

## 2022-07-12 MED ORDER — HYDROMORPHONE HCL 1 MG/ML IJ SOLN
0.5000 mg | INTRAMUSCULAR | Status: DC | PRN
  Administered 2022-07-12: 1 mg via INTRAVENOUS
  Filled 2022-07-12: qty 1

## 2022-07-12 MED ORDER — CEFAZOLIN IN SODIUM CHLORIDE 3-0.9 GM/100ML-% IV SOLN
3.0000 g | INTRAVENOUS | Status: DC
  Filled 2022-07-12: qty 100

## 2022-07-12 MED ORDER — SODIUM CHLORIDE 0.9 % IR SOLN
Status: DC | PRN
  Administered 2022-07-12: 3000 mL

## 2022-07-12 MED ORDER — TRANEXAMIC ACID-NACL 1000-0.7 MG/100ML-% IV SOLN: 1000.0000 mg | Freq: Once | INTRAVENOUS | Status: AC

## 2022-07-12 MED ORDER — ACETAMINOPHEN 10 MG/ML IV SOLN
INTRAVENOUS | Status: AC
  Filled 2022-07-12: qty 100

## 2022-07-12 MED ORDER — ENSURE PRE-SURGERY PO LIQD
296.0000 mL | Freq: Once | ORAL | Status: AC
  Administered 2022-07-12: 296 mL via ORAL
  Filled 2022-07-12: qty 296

## 2022-07-12 MED ORDER — OXYCODONE HCL 5 MG PO TABS
10.0000 mg | ORAL_TABLET | ORAL | Status: DC | PRN
  Administered 2022-07-12 – 2022-07-14 (×8): 10 mg via ORAL
  Filled 2022-07-12 (×8): qty 2

## 2022-07-12 SURGICAL SUPPLY — 86 items
ATTUNE MED DOME PAT 38 KNEE (Knees) IMPLANT
ATTUNE PS FEM RT SZ 7 CEM KNEE (Femur) IMPLANT
ATTUNE PSRP INSR SZ7 10 KNEE (Insert) IMPLANT
BASE TIBIA ATTUNE KNEE SYS SZ6 (Knees) IMPLANT
BATTERY INSTRU NAVIGATION (MISCELLANEOUS) ×4 IMPLANT
BLADE CLIPPER SURG (BLADE) IMPLANT
BLADE SAW 70X12.5 (BLADE) ×1 IMPLANT
BLADE SAW 90X13X1.19 OSCILLAT (BLADE) ×1 IMPLANT
BLADE SAW 90X25X1.19 OSCILLAT (BLADE) ×1 IMPLANT
BONE CEMENT GENTAMICIN (Cement) ×2 IMPLANT
CANISTER WOUND CARE 500ML ATS (WOUND CARE) IMPLANT
CEMENT BONE GENTAMICIN 40 (Cement) IMPLANT
CEMENT HV SMART SET (Cement) IMPLANT
COOLER POLAR GLACIER W/PUMP (MISCELLANEOUS) ×1 IMPLANT
CUFF TOURN SGL QUICK 24 (TOURNIQUET CUFF)
CUFF TOURN SGL QUICK 34 (TOURNIQUET CUFF)
CUFF TRNQT CYL 24X4X16.5-23 (TOURNIQUET CUFF) IMPLANT
CUFF TRNQT CYL 34X4.125X (TOURNIQUET CUFF) IMPLANT
DRAPE 3/4 80X56 (DRAPES) ×1 IMPLANT
DRAPE INCISE IOBAN 66X45 STRL (DRAPES) IMPLANT
DRESSING PEEL AND PLAC PRVNA20 (GAUZE/BANDAGES/DRESSINGS) IMPLANT
DRSG MEPILEX SACRM 8.7X9.8 (GAUZE/BANDAGES/DRESSINGS) ×1 IMPLANT
DRSG NON-ADHERENT DERMACEA 3X4 (GAUZE/BANDAGES/DRESSINGS) ×1 IMPLANT
DRSG OPSITE POSTOP 4X14 (GAUZE/BANDAGES/DRESSINGS) ×1 IMPLANT
DRSG PEEL AND PLACE PREVENA 20 (GAUZE/BANDAGES/DRESSINGS) ×1
DRSG TEGADERM 2-3/8X2-3/4 SM (GAUZE/BANDAGES/DRESSINGS) IMPLANT
DRSG TEGADERM 4X4.75 (GAUZE/BANDAGES/DRESSINGS) ×1 IMPLANT
DURAPREP 26ML APPLICATOR (WOUND CARE) ×2 IMPLANT
ELECT CAUTERY BLADE 6.4 (BLADE) ×1 IMPLANT
ELECT REM PT RETURN 9FT ADLT (ELECTROSURGICAL) ×1
ELECTRODE REM PT RTRN 9FT ADLT (ELECTROSURGICAL) ×1 IMPLANT
EX-PIN ORTHOLOCK NAV 4X150 (PIN) ×2 IMPLANT
GLOVE BIOGEL M STRL SZ7.5 (GLOVE) ×2 IMPLANT
GLOVE BIOGEL PI IND STRL 7.5 (GLOVE) ×1 IMPLANT
GLOVE PI ORTHO PRO STRL 7.5 (GLOVE) ×2 IMPLANT
GLOVE SRG 8 PF TXTR STRL LF DI (GLOVE) ×1 IMPLANT
GLOVE SURG UNDER POLY LF SZ7.5 (GLOVE) ×1 IMPLANT
GLOVE SURG UNDER POLY LF SZ8 (GLOVE) ×1
GOWN STRL REUS W/ TWL LRG LVL3 (GOWN DISPOSABLE) ×1 IMPLANT
GOWN STRL REUS W/ TWL XL LVL3 (GOWN DISPOSABLE) ×1 IMPLANT
GOWN STRL REUS W/TWL LRG LVL3 (GOWN DISPOSABLE) ×1
GOWN STRL REUS W/TWL XL LVL3 (GOWN DISPOSABLE) ×1
GOWN TOGA ZIPPER T7+ PEEL AWAY (MISCELLANEOUS) ×2 IMPLANT
HEMOVAC 400CC 10FR (MISCELLANEOUS) ×1 IMPLANT
HOLDER FOLEY CATH W/STRAP (MISCELLANEOUS) ×1 IMPLANT
IV NS IRRIG 3000ML ARTHROMATIC (IV SOLUTION) ×1 IMPLANT
JET LAVAGE IRRISEPT WOUND (IRRIGATION / IRRIGATOR) ×1
KIT DRSG PREVENA PLUS 7DAY 125 (MISCELLANEOUS) IMPLANT
KIT TURNOVER KIT A (KITS) ×1 IMPLANT
KNIFE SCULPS 14X20 (INSTRUMENTS) ×1 IMPLANT
LAVAGE JET IRRISEPT WOUND (IRRIGATION / IRRIGATOR) IMPLANT
MANIFOLD NEPTUNE II (INSTRUMENTS) ×2 IMPLANT
NDL SPNL 20GX3.5 QUINCKE YW (NEEDLE) ×2 IMPLANT
NEEDLE SPNL 20GX3.5 QUINCKE YW (NEEDLE) ×2 IMPLANT
NS IRRIG 500ML POUR BTL (IV SOLUTION) ×1 IMPLANT
PACK TOTAL KNEE (MISCELLANEOUS) ×1 IMPLANT
PAD ABD DERMACEA PRESS 5X9 (GAUZE/BANDAGES/DRESSINGS) ×2 IMPLANT
PAD WRAPON POLAR KNEE (MISCELLANEOUS) ×1 IMPLANT
PAD WRAPON POLOR MULTI XL (MISCELLANEOUS) IMPLANT
PIN DRILL FIX HALF THREAD (BIT) ×2 IMPLANT
PIN DRILL QUICK PACK ×2 IMPLANT
PIN FIXATION 1/8DIA X 3INL (PIN) ×1 IMPLANT
PULSAVAC PLUS IRRIG FAN TIP (DISPOSABLE) ×1
SOL PREP PVP 2OZ (MISCELLANEOUS) ×1
SOLUTION IRRIG SURGIPHOR (IV SOLUTION) ×1 IMPLANT
SOLUTION PREP PVP 2OZ (MISCELLANEOUS) ×1 IMPLANT
SPONGE DRAIN TRACH 4X4 STRL 2S (GAUZE/BANDAGES/DRESSINGS) ×1 IMPLANT
STAPLER SKIN PROX 35W (STAPLE) ×1 IMPLANT
STOCKINETTE IMPERV 14X48 (MISCELLANEOUS) ×1 IMPLANT
STRAP TIBIA SHORT (MISCELLANEOUS) ×1 IMPLANT
SUCTION FRAZIER HANDLE 10FR (MISCELLANEOUS) ×1
SUCTION TUBE FRAZIER 10FR DISP (MISCELLANEOUS) ×1 IMPLANT
SUT VIC AB 0 CT1 36 (SUTURE) ×1 IMPLANT
SUT VIC AB 1 CT1 36 (SUTURE) ×2 IMPLANT
SUT VIC AB 2-0 CT2 27 (SUTURE) ×1 IMPLANT
SYR 30ML LL (SYRINGE) ×2 IMPLANT
TIBIA ATTUNE KNEE SYS BASE SZ6 (Knees) ×1 IMPLANT
TIP FAN IRRIG PULSAVAC PLUS (DISPOSABLE) ×1 IMPLANT
TOWEL OR 17X26 4PK STRL BLUE (TOWEL DISPOSABLE) IMPLANT
TOWER CARTRIDGE SMART MIX (DISPOSABLE) ×1 IMPLANT
TRAP FLUID SMOKE EVACUATOR (MISCELLANEOUS) ×1 IMPLANT
TRAY FOLEY MTR SLVR 16FR STAT (SET/KITS/TRAYS/PACK) ×1 IMPLANT
WATER STERILE IRR 1000ML POUR (IV SOLUTION) ×1 IMPLANT
WRAP-ON POLOR PAD MULTI XL (MISCELLANEOUS) ×1
WRAPON POLAR PAD KNEE (MISCELLANEOUS)
WRAPON POLOR PAD MULTI XL (MISCELLANEOUS) ×1

## 2022-07-12 NOTE — Anesthesia Procedure Notes (Signed)
Spinal  Patient location during procedure: OR Start time: 07/12/2022 7:25 AM End time: 07/12/2022 7:33 AM Reason for block: surgical anesthesia Staffing Performed: resident/CRNA  Resident/CRNA: Nelda Marseille, CRNA Performed by: Nelda Marseille, CRNA Authorized by: Iran Ouch, MD   Preanesthetic Checklist Completed: patient identified, IV checked, site marked, risks and benefits discussed, surgical consent, monitors and equipment checked, pre-op evaluation and timeout performed Spinal Block Patient position: sitting Prep: ChloraPrep Patient monitoring: heart rate, continuous pulse ox, blood pressure and cardiac monitor Approach: midline Location: L3-4 Injection technique: single-shot Needle Needle type: Whitacre and Introducer  Needle gauge: 25 G Needle length: 9 cm Assessment Sensory level: T10 Events: CSF return Additional Notes Sterile aseptic technique used throughout the procedure.  Negative paresthesia. Negative blood return. Positive free-flowing CSF. Expiration date of kit checked and confirmed. Patient tolerated procedure well, without complications.

## 2022-07-12 NOTE — TOC Progression Note (Signed)
Transition of Care [facility]) - Progression Note    Patient Details  Name: Rita Foster MRN: [id number] Date of Birth: 10-25-1960  Transition of Care [facility]) CM/SW [place], RN Phone Number: 07/12/2022, 3:34 PM  Clinical Narrative:     The patient has Christella Scheuermann and is Not In network with [place], they notified Dr Marry Guan, Per [place] Dr Maree Krabbe office is to arrange Outpatient PT, TOC will follow and assist with DC planning, PT to evaluate and make recommendation       Expected Discharge Plan and Services                                               Social Determinants of Health (SDOH) Interventions SDOH Screenings   Food Insecurity: No Food Insecurity (07/12/2022)  Housing: Low Risk  (07/12/2022)  Transportation Needs: No Transportation Needs (07/12/2022)  Utilities: Not At Risk (07/12/2022)  Alcohol Screen: Low Risk  (01/04/2022)  Depression (PHQ2-9): Low Risk  (01/04/2022)  Tobacco Use: Medium Risk (07/12/2022)    Readmission Risk Interventions     No data to display

## 2022-07-12 NOTE — Progress Notes (Signed)
Patient awake/alert x4.  Able to move bil lower ext, sensation intact, able to wiggle, bend left knee.  C/o's back pain : left sided. States she can not be on her back for long periods.  Medicated as ordered.  Note;  left ankle patient states "fractured a year ago"  Dr. Marry Guan aware. Patient awake/alert x4   tolerated po fluids and crackers.

## 2022-07-12 NOTE — Interval H&P Note (Signed)
History and Physical Interval Note:  07/12/2022 6:06 AM  Rita Foster  has presented today for surgery, with the diagnosis of PRIMARY OSTEOARTHRITIS OF RIGHT KNEE..  The various methods of treatment have been discussed with the patient and family. After consideration of risks, benefits and other options for treatment, the patient has consented to  Procedure(s): COMPUTER ASSISTED TOTAL KNEE ARTHROPLASTY - RNFA (Right) as a surgical intervention.  The patient's history has been reviewed, patient examined, no change in status, stable for surgery.  I have reviewed the patient's chart and labs.  Questions were answered to the patient's satisfaction.     [place]

## 2022-07-12 NOTE — Op Note (Addendum)
OPERATIVE NOTE  DATE OF SURGERY:  07/12/2022  PATIENT NAME:  Rita Foster   DOB: 02-28-1961  MRN: [id number]  PRE-OPERATIVE DIAGNOSIS: Degenerative arthrosis of the right knee, primary  POST-OPERATIVE DIAGNOSIS:  Same  PROCEDURE:  Right total knee arthroplasty using computer-assisted navigation  SURGEON:  Marciano Sequin. M.D.  ANESTHESIA: spinal  ESTIMATED BLOOD LOSS: 50 mL  FLUIDS REPLACED: 1400 mL of crystalloid  TOURNIQUET TIME: 99 minutes  DRAINS: 2 medium Hemovac drains, wound VAC  SOFT TISSUE RELEASES: Anterior cruciate ligament, posterior cruciate ligament, deep medial collateral ligament, patellofemoral ligament  IMPLANTS UTILIZED: DePuy Attune size 7 posterior stabilized femoral component (cemented), size 6 rotating platform tibial component (cemented), 38 mm medialized dome patella (cemented), and a 10 mm stabilized rotating platform polyethylene insert.  INDICATIONS FOR SURGERY: Rita Foster is a 62 y.o. year old female with a long history of progressive knee pain. X-rays demonstrated severe degenerative changes in tricompartmental fashion. The patient had not seen any significant improvement despite conservative nonsurgical intervention. After discussion of the risks and benefits of surgical intervention, the patient expressed understanding of the risks benefits and agree with plans for total knee arthroplasty.   The risks, benefits, and alternatives were discussed at length including but not limited to the risks of infection, bleeding, nerve injury, stiffness, blood clots, the need for revision surgery, cardiopulmonary complications, among others, and they were willing to proceed.  PROCEDURE IN DETAIL: The patient was brought into the operating room and, after adequate spinal anesthesia was achieved, a tourniquet was placed on the patient's upper thigh. The patient's knee and leg were cleaned and prepped with alcohol and DuraPrep and draped in the usual  sterile fashion. A "timeout" was performed as per usual protocol. The lower extremity was exsanguinated using an Esmarch, and the tourniquet was inflated to 300 mmHg. An anterior longitudinal incision was made followed by a standard mid vastus approach. The deep fibers of the medial collateral ligament were elevated in a subperiosteal fashion off of the medial flare of the tibia so as to maintain a continuous soft tissue sleeve. The patella was subluxed laterally and the patellofemoral ligament was incised. Inspection of the knee demonstrated severe degenerative changes with full-thickness loss of articular cartilage. Osteophytes were debrided using a rongeur. Anterior and posterior cruciate ligaments were excised. Two 4.0 mm Schanz pins were inserted in the femur and into the tibia for attachment of the array of trackers used for computer-assisted navigation. Hip center was identified using a circumduction technique. Distal landmarks were mapped using the computer. The distal femur and proximal tibia were mapped using the computer. The distal femoral cutting guide was positioned using computer-assisted navigation so as to achieve a 5 distal valgus cut. The femur was sized and it was felt that a size 7 femoral component was appropriate. A size 7 femoral cutting guide was positioned and the anterior cut was performed and verified using the computer. This was followed by completion of the posterior and chamfer cuts. Femoral cutting guide for the central box was then positioned in the center box cut was performed.  Attention was then directed to the proximal tibia. Medial and lateral menisci were excised. The extramedullary tibial cutting guide was positioned using computer-assisted navigation so as to achieve a 0 varus-valgus alignment and 3 posterior slope. The cut was performed and verified using the computer. The proximal tibia was sized and it was felt that a size 6 tibial tray was appropriate. Tibial and  femoral trials were  inserted followed by insertion of a 10 mm polyethylene insert. This allowed for excellent mediolateral soft tissue balancing both in flexion and in full extension. Finally, the patella was cut and prepared so as to accommodate a 38 mm medialized dome patella. A patella trial was placed and the knee was placed through a range of motion with excellent patellar tracking appreciated. The femoral trial was removed after debridement of posterior osteophytes. The central post-hole for the tibial component was reamed followed by insertion of a keel punch. Tibial trials were then removed. Cut surfaces of bone were irrigated with copious amounts of normal saline using pulsatile lavage and then suctioned dry. Polymethylmethacrylate cement with gentamicin was prepared in the usual fashion using a vacuum mixer. Cement was applied to the cut surface of the proximal tibia as well as along the undersurface of a size 6 rotating platform tibial component. Tibial component was positioned and impacted into place. Excess cement was removed using [employer] fast streamer. Cement was then applied to the cut surfaces of the femur as well as along the posterior flanges of the size 7 femoral component. The femoral component was positioned and impacted into place. Excess cement was removed using [employer] fast streamer. A 10 mm polyethylene trial was inserted and the knee was brought into full extension with steady axial compression applied. Finally, cement was applied to the backside of a 38 mm medialized dome patella and the patellar component was positioned and patellar clamp applied. Excess cement was removed using [employer] fast streamer. After adequate curing of the cement, the tourniquet was deflated after a total tourniquet time of 99 minutes. Hemostasis was achieved using electrocautery. The knee was irrigated with copious amounts of normal saline using pulsatile lavage followed by 450 ml of Irrisept and then suctioned dry. 20 mL of 1.3%  Exparel and 60 mL of 0.25% Marcaine in 40 mL of normal saline was injected along the posterior capsule, medial and lateral gutters, and along the arthrotomy site. A 10 mm stabilized rotating platform polyethylene insert was inserted and the knee was placed through a range of motion with excellent mediolateral soft tissue balancing appreciated and excellent patellar tracking noted. 2 medium drains were placed in the wound bed and brought out through separate stab incisions. The medial parapatellar portion of the incision was reapproximated using interrupted sutures of #1 Vicryl. Subcutaneous tissue was approximated in layers using first #0 Vicryl followed #2-0 Vicryl. The skin was approximated with skin staples. A Prevena wound VAC and a sterile dressing were applied.  The patient tolerated the procedure well and was transported to the recovery room in stable condition.    Burech Mcfarland P. Holley Bouche., M.D.

## 2022-07-12 NOTE — Evaluation (Signed)
Physical Therapy Evaluation Patient Details Name: Rita Foster MRN: [id number] DOB: Jul 06, 1960 Today's Date: 07/12/2022  History of Present Illness  Pt is a 62 yo F diagnosed with degenerative arthrosis of the right knee and is s/p elective R TKA.  PMH includes HTN and HLD.   Clinical Impression  Pt was pleasant and motivated to participate during the session and put forth good effort throughout. Pt required no physical assistance with any task but did require cuing for proper sequencing with transfers and gait.  Pt was steady during ambulation with step-to pattern without LOB or buckling but did lean heavily on her RW for support with UE fatigue being her primary limiting factor while walking near the EOB and to the chair.  Pt reported no adverse symptoms other than min R knee pain with SpO2 and HR WNL on room air.  Pt will benefit continued skilled PT services upon discharge to safely address deficits listed in patient problem list for decreased caregiver assistance and eventual return to PLOF.        Recommendations for follow up therapy are one component of a multi-disciplinary discharge planning process, led by the attending physician.  Recommendations may be updated based on patient status, additional functional criteria and insurance authorization.  Follow Up Recommendations Follow physician's recommendations for discharge plan and follow up therapies      Assistance Recommended at Discharge Intermittent Supervision/Assistance  Patient can return home with the following  A little help with walking and/or transfers;A little help with bathing/dressing/bathroom;Help with stairs or ramp for entrance;Assist for transportation;Assistance with cooking/housework    Equipment Recommendations BSC/3in1  Recommendations for Other Services       Functional Status Assessment Patient has had a recent decline in their functional status and demonstrates the ability to make significant  improvements in function in a reasonable and predictable amount of time.     Precautions / Restrictions Precautions Precautions: Fall Restrictions Weight Bearing Restrictions: Yes RLE Weight Bearing: Weight bearing as tolerated Other Position/Activity Restrictions: Pt able to perform Ind RLE SLR without extensor lag, no KI required      Mobility  Bed Mobility Overal bed mobility: Modified Independent             General bed mobility comments: Min extra time and effort only    Transfers Overall transfer level: Needs assistance Equipment used: Rolling walker (2 wheels) Transfers: Sit to/from Stand Sit to Stand: Min guard           General transfer comment: Mod verbal cues for sequencing    Ambulation/Gait Ambulation/Gait assistance: Min guard Gait Distance (Feet): 5 Feet Assistive device: Rolling walker (2 wheels) Gait Pattern/deviations: Step-to pattern, Decreased stance time - right, Decreased step length - left, Antalgic Gait velocity: decreased     General Gait Details: Heavy lean on the RW for support but generally steady with no LOB or buckling; mod verbal cues for step-to sequencing for pain control  Stairs            Wheelchair Mobility    Modified Rankin (Stroke Patients Only)       Balance Overall balance assessment: Needs assistance   Sitting balance-Leahy Scale: Normal     Standing balance support: Bilateral upper extremity supported, During functional activity Standing balance-Leahy Scale: Fair                               Pertinent Vitals/Pain Pain Assessment Pain Assessment: 0-10  Pain Score: 1  Pain Location: R knee Pain Descriptors / Indicators: Sore Pain Intervention(s): Repositioned, Ice applied, Premedicated before session, Monitored during session    Home Living Family/patient expects to be discharged to:: Private residence Living Arrangements: Parent Available Help at Discharge:  Family;Friend(s);Available 24 hours/day Type of Home: House Home Access: Ramped entrance       Home Layout: Two level;Able to live on main level with bedroom/bathroom Home Equipment: Rolling Walker (2 wheels);Cane - single point;Wheelchair - manual      Prior Function Prior Level of Function : Independent/Modified Independent             Mobility Comments: Mod Ind limited community distances with either her RW or SPC, no fall history ADLs Comments: Ind with ADLs     Hand Dominance        Extremity/Trunk Assessment   Upper Extremity Assessment Upper Extremity Assessment: Overall WFL for tasks assessed    Lower Extremity Assessment Lower Extremity Assessment: RLE deficits/detail RLE Deficits / Details: BLE ankle strength, AROM, and sensation to light touch WNL; R hip flex strength >/= 3/5 RLE: Unable to fully assess due to pain RLE Sensation: WNL RLE Coordination: WNL       Communication   Communication: No difficulties  Cognition Arousal/Alertness: Awake/alert Behavior During Therapy: WFL for tasks assessed/performed Overall Cognitive Status: Within Functional Limits for tasks assessed                                          General Comments      Exercises Total Joint Exercises Ankle Circles/Pumps: AROM, Strengthening, Both, 10 reps Quad Sets: AROM, Strengthening, Right, 5 reps, 10 reps Long Arc Quad: AROM, Strengthening, Right, 5 reps, 10 reps Knee Flexion: AROM, Strengthening, Right, 5 reps, 10 reps Goniometric ROM: R knee AROM: 1-78 deg Marching in Standing: AROM, Strengthening, Both, 5 reps, Standing Other Exercises Other Exercises: HEP education per handout Other Exercises: RLE positioning education to promote knee ext PROM and prevent heel pressure   Assessment/Plan    PT Assessment Patient needs continued PT services  PT Problem List Decreased strength;Decreased range of motion;Decreased balance;Decreased mobility;Decreased  knowledge of use of DME;Pain       PT Treatment Interventions DME instruction;Gait training;Functional mobility training;Therapeutic activities;Therapeutic exercise;Balance training;Patient/family education    PT Goals (Current goals can be found in the Care Plan section)  Acute Rehab PT Goals Patient Stated Goal: "For my right leg to be normal" PT Goal Formulation: With patient Time For Goal Achievement: 07/25/22 Potential to Achieve Goals: Good    Frequency BID     Co-evaluation               AM-PAC PT "6 Clicks" Mobility  Outcome Measure Help needed turning from your back to your side while in a flat bed without using bedrails?: A Little Help needed moving from lying on your back to sitting on the side of a flat bed without using bedrails?: A Little Help needed moving to and from a bed to a chair (including a wheelchair)?: A Little Help needed standing up from a chair using your arms (e.g., wheelchair or bedside chair)?: A Little Help needed to walk in hospital room?: A Little Help needed climbing 3-5 steps with a railing? : A Lot 6 Click Score: 17    End of Session Equipment Utilized During Treatment: Gait belt Activity Tolerance: Patient tolerated  treatment well Patient left: in chair;with call bell/phone within reach;with chair alarm set;with SCD's reapplied;Other (comment) (polar care to RLE) Nurse Communication: Mobility status;Weight bearing status PT Visit Diagnosis: Other abnormalities of gait and mobility (R26.89);Muscle weakness (generalized) (M62.81);Pain Pain - Right/Left: Right Pain - part of body: Knee    Time: 0141-5973 PT Time Calculation (min) (ACUTE ONLY): 60 min   Charges:   PT Evaluation $PT Eval Moderate Complexity: 1 Mod PT Treatments $Therapeutic Exercise: 8-22 mins $Therapeutic Activity: 8-22 mins       D. Royetta Asal PT, DPT 07/12/22, 5:16 PM

## 2022-07-12 NOTE — Anesthesia Procedure Notes (Signed)
Date/Time: 07/12/2022 7:56 AM  Performed by: Nelda Marseille, CRNAPre-anesthesia Checklist: Patient identified, Emergency Drugs available, Suction available, Patient being monitored and Timeout performed Oxygen Delivery Method: Simple face mask

## 2022-07-12 NOTE — Anesthesia Preprocedure Evaluation (Addendum)
Anesthesia Evaluation  Patient identified by MRN, date of birth, ID band Patient awake    Reviewed: Allergy & Precautions, NPO status , Patient's Chart, lab work & pertinent test results  Airway Mallampati: III  TM Distance: >3 FB Neck ROM: full    Dental no notable dental hx.    Pulmonary asthma , former smoker   Pulmonary exam normal        Cardiovascular Exercise Tolerance: Poor hypertension, Normal cardiovascular exam     Neuro/Psych negative neurological ROS  negative psych ROS   GI/Hepatic negative GI ROS, Neg liver ROS,,,  Endo/Other    Morbid obesityPre-diabetes  Renal/GU      Musculoskeletal  (+) Arthritis ,    Abdominal  (+) + obese  Peds  Hematology negative hematology ROS (+)   Anesthesia Other Findings Reports use of walker chronically with no neurological complaints.  Past Medical History: No date: Asthma 05/10/2022: Basal cell carcinoma     Comment:  Left nasal sidewall. Nodular type. Mohs pending. No date: Hyperlipidemia No date: Hypertension No date: Pre-diabetes  Past Surgical History: 10+ years ago: BREAST BIOPSY; Left     Comment:  Negative core No date: BREAST EXCISIONAL BIOPSY; Left     Comment:  2002 07/2014: CATARACT EXTRACTION; Right 2016: CATARACT EXTRACTION; Left 02/21/2010: HERNIA REPAIR 1968: TONSILLECTOMY AND ADENOIDECTOMY  BMI    Body Mass Index: 41.65 kg/m      Reproductive/Obstetrics negative OB ROS                             Anesthesia Physical Anesthesia Plan  ASA: 3  Anesthesia Plan: Spinal   Post-op Pain Management: Regional block* and Ofirmev IV (intra-op)*   Induction:   PONV Risk Score and Plan:   Airway Management Planned: Natural Airway  Additional Equipment:   Intra-op Plan:   Post-operative Plan:   Informed Consent: I have reviewed the patients History and Physical, chart, labs and discussed the procedure  including the risks, benefits and alternatives for the proposed anesthesia with the patient or authorized representative who has indicated his/her understanding and acceptance.     Dental Advisory Given  Plan Discussed with: Anesthesiologist, CRNA and Surgeon  Anesthesia Plan Comments:         Anesthesia Quick Evaluation

## 2022-07-12 NOTE — Transfer of Care (Signed)
Immediate Anesthesia Transfer of Care Note  Patient: Rita Foster  Procedure(s) Performed: COMPUTER ASSISTED TOTAL KNEE ARTHROPLASTY (Right: Knee)  Patient Location: PACU  Anesthesia Type:Spinal  Level of Consciousness: awake, alert , and oriented  Airway & Oxygen Therapy: Patient Spontanous Breathing  Post-op Assessment: Report given to RN and Post -op Vital signs reviewed and stable  Post vital signs: Reviewed and stable  Last Vitals:  Vitals Value Taken Time  BP 132/82 07/12/22 1124  Temp 36.4 C 07/12/22 1124  Pulse 72 07/12/22 1125  Resp 13 07/12/22 1125  SpO2 98 % 07/12/22 1125  Vitals shown include unvalidated device data.  Last Pain:  Vitals:   07/12/22 1124  TempSrc:   PainSc: 0-No pain         Complications: No notable events documented.

## 2022-07-13 DIAGNOSIS — M1711 Unilateral primary osteoarthritis, right knee: Secondary | ICD-10-CM | POA: Diagnosis not present

## 2022-07-13 MED ORDER — CELECOXIB 200 MG PO CAPS: 200.0000 mg | ORAL_CAPSULE | Freq: Two times a day (BID) | ORAL | 1 refills | Status: DC

## 2022-07-13 MED ORDER — TRAMADOL HCL 50 MG PO TABS: 50.0000 mg | ORAL_TABLET | ORAL | 0 refills | Status: DC | PRN

## 2022-07-13 MED ORDER — ENOXAPARIN SODIUM 40 MG/0.4ML IJ SOSY: 40.0000 mg | PREFILLED_SYRINGE | INTRAMUSCULAR | 0 refills | Status: DC

## 2022-07-13 MED ORDER — DICLOFENAC SODIUM 1 % EX GEL
4.0000 g | Freq: Four times a day (QID) | CUTANEOUS | Status: DC
  Administered 2022-07-13 – 2022-07-14 (×5): 4 g via TOPICAL
  Filled 2022-07-13: qty 100

## 2022-07-13 MED ORDER — OXYCODONE HCL 5 MG PO TABS: 5.0000 mg | ORAL_TABLET | ORAL | 0 refills | Status: DC | PRN

## 2022-07-13 NOTE — Evaluation (Signed)
Occupational Therapy Evaluation Patient Details Name: Rita Foster MRN: [id number] DOB: 1961-02-01 Today's Date: 07/13/2022   History of Present Illness Pt is a 62 yo F diagnosed with degenerative arthrosis of the right knee and is s/p elective R TKA.  PMH includes HTN and HLD.   Clinical Impression   Pt seen for OT evaluation this date, POD#1 from above surgery. Pt was mostly IND in ADLs prior to surgery, however, ambulating short distances only, w/ RW or SPC and sponge bathing at baseline, unable to get into/out of bathtub in her home. Pt is eager to return to PLOF with less pain and improved safety and independence. Pt currently requires Mod A for LB dressing while in seated position, Mod A for transferring to/from [facility], Min A for sit<>stand transfers. Pt instructed in polar care mgt, falls prevention strategies, home/routines modifications, and DME/AE for LB bathing and dressing tasks. Pt has limited assistance at home and continues to have significant pain (7/10) and reduced fxl mobility. Recommend ongoing OT while hospitalized with HHOT post DC.     Recommendations for follow up therapy are one component of a multi-disciplinary discharge planning process, led by the attending physician.  Recommendations may be updated based on patient status, additional functional criteria and insurance authorization.   Follow Up Recommendations  Home health OT     Assistance Recommended at Discharge Intermittent Supervision/Assistance  Patient can return home with the following A little help with walking and/or transfers;A little help with bathing/dressing/bathroom;Assistance with cooking/housework;Assist for transportation    Functional Status Assessment  Patient has had a recent decline in their functional status and demonstrates the ability to make significant improvements in function in a reasonable and predictable amount of time.  Equipment Recommendations  BSC/3in1    Recommendations for  Other Services       Precautions / Restrictions Precautions Precautions: Fall Restrictions Weight Bearing Restrictions: Yes RLE Weight Bearing: Weight bearing as tolerated Other Position/Activity Restrictions: Pt able to perform Ind RLE SLR without extensor lag, no KI required      Mobility Bed Mobility               General bed mobility comments: In recliner pre/post session    Transfers Overall transfer level: Needs assistance Equipment used: Rolling walker (2 wheels) Transfers: Sit to/from Stand Sit to Stand: Min assist           General transfer comment: pt does required extensive time + vcs to stand. Min physical assistance to acheive standing. poor intial standing posture however was able to correct.      Balance Overall balance assessment: Needs assistance Sitting-balance support: Feet supported Sitting balance-Leahy Scale: Normal     Standing balance support: Bilateral upper extremity supported, During functional activity, Reliant on assistive device for balance Standing balance-Leahy Scale: Fair Standing balance comment: pain severely limiting pt                           ADL either performed or assessed with clinical judgement   ADL Overall ADL's : Needs assistance/impaired                     Lower Body Dressing: Moderate assistance Lower Body Dressing Details (indicate cue type and reason): donning shoes Toilet Transfer: Moderate assistance;Requires wide/bariatric;BSC/3in1;Rolling walker (2 wheels)   Toileting- Clothing Manipulation and Hygiene: Modified independent;Sitting/lateral lean               Vision  Perception     Praxis      Pertinent Vitals/Pain Pain Assessment Pain Score: 8  Pain Location: R knee Pain Descriptors / Indicators: Sore, Sharp, Discomfort, Grimacing, Guarding, Operative site guarding Pain Intervention(s): Limited activity within patient's tolerance, Premedicated before session,  Repositioned, Ice applied     Hand Dominance     Extremity/Trunk Assessment Upper Extremity Assessment Upper Extremity Assessment: Overall WFL for tasks assessed   Lower Extremity Assessment Lower Extremity Assessment: RLE deficits/detail RLE Deficits / Details: BLE ankle strength, AROM, and sensation to light touch WNL; R hip flex strength >/= 3/5       Communication Communication Communication: No difficulties   Cognition Arousal/Alertness: Awake/alert Behavior During Therapy: WFL for tasks assessed/performed Overall Cognitive Status: Within Functional Limits for tasks assessed                                       General Comments  reviewed HEP, polar care, bone foam, and positioning to promote improve ROM and healing    Exercises Other Exercises Other Exercises: Educ re: home safety, polar care mgmt, LB dressing   Shoulder Instructions      Home Living Family/patient expects to be discharged to:: Private residence Living Arrangements: Parent Available Help at Discharge: Family;Friend(s);Available PRN/intermittently (Pt lives with her father. He is at home 24/7 but has limited capacity to assist pt (pt serves as his primary caregiver).) Type of Home: House Home Access: Ramped entrance     Home Layout: Two level;Able to live on main level with bedroom/bathroom     Bathroom Shower/Tub: Teacher, early years/pre: Standard     Home Equipment: Conservation officer, nature (2 wheels);Cane - single point;Wheelchair - manual          Prior Functioning/Environment Prior Level of Function : Needs assist             Mobility Comments: Mod Ind limited community distances with either her RW or SPC, no fall history ADLs Comments: Able to perform most BADL INDly. Sponge bathes at baseline. Pt reports she does all shopping by pre-ordering and then having store personnel bring purchase to her car.        OT Problem List: Pain;Impaired balance (sitting  and/or standing);Decreased activity tolerance;Decreased strength;Decreased range of motion;Decreased coordination;Obesity      OT Treatment/Interventions: Self-care/ADL training;Therapeutic exercise;Patient/family education;Balance training;Energy conservation;Therapeutic activities;DME and/or AE instruction    OT Goals(Current goals can be found in the care plan section) Acute Rehab OT Goals Patient Stated Goal: to have less knee pain OT Goal Formulation: With patient Time For Goal Achievement: 07/27/22 Potential to Achieve Goals: Good ADL Goals Pt Will Perform Lower Body Dressing: with modified independence;sitting/lateral leans Pt Will Transfer to Toilet: ambulating;with modified independence Pt Will Perform Tub/Shower Transfer: shower seat;rolling walker;with min assist  OT Frequency: Min 2X/week    Co-evaluation              AM-PAC OT "6 Clicks" Daily Activity     Outcome Measure Help from another person eating meals?: None Help from another person taking care of personal grooming?: A Little Help from another person toileting, which includes using toliet, bedpan, or urinal?: A Little Help from another person bathing (including washing, rinsing, drying)?: A Lot Help from another person to put on and taking off regular upper body clothing?: A Little Help from another person to put on and taking off regular lower  body clothing?: A Lot 6 Click Score: 17   End of Session Equipment Utilized During Treatment: Rolling walker (2 wheels)  Activity Tolerance: Patient tolerated treatment well;Patient limited by pain Patient left: in chair;with call bell/phone within reach  OT Visit Diagnosis: Unsteadiness on feet (R26.81);Muscle weakness (generalized) (M62.81);Pain Pain - Right/Left: Right Pain - part of body: Knee                Time: 1410-1443 OT Time Calculation (min): 33 min Charges:  OT General Charges $OT Visit: 1 Visit OT Evaluation $OT Eval Moderate Complexity: 1  Mod OT Treatments $Self Care/Home Management : 23-37 mins Josiah Lobo, PhD, MS, OTR/L 07/13/22, 3:13 PM

## 2022-07-13 NOTE — Discharge Summary (Signed)
Physician Discharge Summary  Subjective: 1 Day Post-Op Procedure(s) (LRB): COMPUTER ASSISTED TOTAL KNEE ARTHROPLASTY (Right) Patient reports pain as mild.   Patient seen in rounds with Dr. Marry Guan. Patient is well, and has had no acute complaints or problems Patient is ready to go home with outpatient physical therapy.  Physician Discharge Summary  [name] MRN: [id number] DOB/AGE: March 26, 1961 62 y.o.  Admit date: 07/12/2022 Discharge date: 07/13/2022  Admission Diagnoses:  Discharge Diagnoses:  Principal Problem:   Total knee replacement status   Discharged Condition: fair  Hospital Course: The patient is postop day 1 from a right total knee arthroplasty.  She is doing well since surgery.  Her vitals have remained stable.  Her pain is manageable.  She is ready to work with physical therapy this morning before going home.  Treatments: surgery:  Right total knee arthroplasty using computer-assisted navigation   SURGEON:  Marciano Sequin. M.D.   ANESTHESIA: spinal   ESTIMATED BLOOD LOSS: 50 mL   FLUIDS REPLACED: 1400 mL of crystalloid   TOURNIQUET TIME: 99 minutes   DRAINS: 2 medium Hemovac drains, wound VAC   SOFT TISSUE RELEASES: Anterior cruciate ligament, posterior cruciate ligament, deep medial collateral ligament, patellofemoral ligament   IMPLANTS UTILIZED: DePuy Attune size 7 posterior stabilized femoral component (cemented), size 6 rotating platform tibial component (cemented), 38 mm medialized dome patella (cemented), and a 10 mm stabilized rotating platform polyethylene insert.  Discharge Exam: Blood pressure (!) 153/99, pulse 68, temperature 98.1 F (36.7 C), resp. rate 16, height '5\' 8"'$  (1.727 m), weight 124.2 kg, SpO2 97 %.   Disposition: Discharge disposition: 01-Home or Self Care        Allergies as of 07/13/2022       Reactions   Gabapentin Other (See Comments)   dizziness   Mobic [meloxicam] Other (See Comments)    Abdominal pain   Penicillins    IgE = 89 (WNL) on 06/30/2022 As a child-unsure of reaction        Medication List     STOP taking these medications    diclofenac 50 MG EC tablet Commonly known as: VOLTAREN       TAKE these medications    acyclovir 400 MG tablet Commonly known as: ZOVIRAX TAKE 1 TABLET(400 MG) BY MOUTH TWICE DAILY What changed:  how much to take when to take this additional instructions   albuterol 108 (90 Base) MCG/ACT inhaler Commonly known as: VENTOLIN HFA Inhale 2 puffs into the lungs every 6 (six) hours as needed for wheezing or shortness of breath.   Align Prebiotic-Probiotic 5-1.25 MG-GM Chew Chew 2 tablets by mouth daily. gummy   amLODipine 10 MG tablet Commonly known as: NORVASC TAKE 1 TABLET(10 MG) BY MOUTH DAILY What changed:  how much to take how to take this when to take this   Blood Pressure Monitor/Wrist Devi To check bp daily   celecoxib 200 MG capsule Commonly known as: CELEBREX Take 1 capsule (200 mg total) by mouth 2 (two) times daily.   diclofenac Sodium 1 % Gel Commonly known as: VOLTAREN Apply 2 g topically in the morning and at bedtime.   enoxaparin 40 MG/0.4ML injection Commonly known as: LOVENOX Inject 0.4 mLs (40 mg total) into the skin daily for 14 days.   HAIR SKIN & NAILS GUMMIES PO Take 2 tablets by mouth every evening.   losartan 100 MG tablet Commonly known as: COZAAR Take 1 tablet (100 mg total) by mouth daily. What changed: when  to take this   Multiple Vitamin tablet Take 1 tablet by mouth daily.   naproxen sodium 220 MG tablet Commonly known as: ALEVE Take 440 mg by mouth 2 (two) times daily.   Osteo Bi-Flex Adv Triple St Tabs Take 2 tablets by mouth daily.   OVER THE COUNTER MEDICATION Take 1 tablet by mouth daily. Green-Lipped Mussel Oil   oxyCODONE 5 MG immediate release tablet Commonly known as: Oxy IR/ROXICODONE Take 1 tablet (5 mg total) by mouth every 4 (four) hours as needed  for moderate pain (pain score 4-6).   rosuvastatin 20 MG tablet Commonly known as: Crestor Take 1 tablet (20 mg total) by mouth daily. What changed: when to take this   traMADol 50 MG tablet Commonly known as: ULTRAM Take 1-2 tablets (50-100 mg total) by mouth every 4 (four) hours as needed for moderate pain.   traZODone 50 MG tablet Commonly known as: DESYREL TAKE 1 TABLET(50 MG) BY MOUTH AT BEDTIME AS NEEDED FOR SLEEP What changed: See the new instructions.   tretinoin 0.025 % cream Commonly known as: RETIN-A Apply topically at bedtime. For acne at face. Can use followed by moisturizer if irritating. What changed:  how much to take when to take this reasons to take this   Tylenol 8 Hour Arthritis Pain 650 MG CR tablet Generic drug: acetaminophen Take 1,300 mg by mouth 2 (two) times daily.               Durable Medical Equipment  (From admission, onward)           Start     Ordered   07/12/22 1237  DME Walker rolling  Once       Question:  Patient needs a walker to treat with the following condition  Answer:  Total knee replacement status   07/12/22 1236   07/12/22 1237  DME Bedside commode  Once       Comments: Patient is not able to walk the distance required to go the bathroom, or he/she is unable to safely negotiate stairs required to access the bathroom.  A 3in1 BSC will alleviate this problem  Question:  Patient needs a bedside commode to treat with the following condition  Answer:  Total knee replacement status   07/12/22 1236            Follow-up Information     Watt Climes, PA Follow up on 07/26/2022.   Specialty: Physician Assistant Why: at 9:15am Contact information: [place] [address] [phone]         Dereck Leep, MD Follow up on 08/22/2022.   Specialty: Orthopedic Surgery Why: at 2:15pm Contact information: [place] [address] [phone]                  Signed: Prescott Parma, Milika Ventress 07/13/2022, 7:20 AM   Objective: Vital signs in last 24 hours: Temp:  [97.5 F (36.4 C)-98.8 F (37.1 C)] 98.1 F (36.7 C) (01/11 0332) Pulse Rate:  [52-75] 68 (01/11 0700) Resp:  [11-18] 16 (01/11 0332) BP: (109-156)/(61-99) 153/99 (01/11 0700) SpO2:  [96 %-100 %] 97 % (01/11 0332)  Intake/Output from previous day:  Intake/Output Summary (Last 24 hours) at 07/13/2022 0720 Last data filed at 07/13/2022 0523 Gross per 24 hour  Intake 4370 ml  Output 2875 ml  Net 1495 ml    Intake/Output this shift: No intake/output data recorded.  Labs: No results for input(s): "HGB" in  the last 72 hours. No results for input(s): "WBC", "RBC", "HCT", "PLT" in the last 72 hours. No results for input(s): "NA", "K", "CL", "CO2", "BUN", "CREATININE", "GLUCOSE", "CALCIUM" in the last 72 hours. No results for input(s): "LABPT", "INR" in the last 72 hours.  EXAM: General - Patient is Alert and Oriented Extremity - Neurovascular intact Sensation intact distally Intact pulses distally Compartment soft Incision - clean, dry, with a Hemovac tubing removed with no complication.  The wound VAC is in place. Motor Function -plantarflexion and dorsiflexion are intact.  Able to straight leg raise independently with minimal assistance.  Assessment/Plan: 1 Day Post-Op Procedure(s) (LRB): COMPUTER ASSISTED TOTAL KNEE ARTHROPLASTY (Right) Procedure(s) (LRB): COMPUTER ASSISTED TOTAL KNEE ARTHROPLASTY (Right) Past Medical History:  Diagnosis Date   Asthma    Basal cell carcinoma 05/10/2022   Left nasal sidewall. Nodular type. Mohs pending.   Hyperlipidemia    Hypertension    Pre-diabetes    Principal Problem:   Total knee replacement status  Estimated body mass index is 41.65 kg/m as calculated from the following:   Height as of this encounter: '5\' 8"'$  (1.727 m).   Weight as of this encounter: 124.2 kg. Advance diet Up with therapy D/C IV fluids Diet - Regular  diet Follow up - in 2 weeks Activity - WBAT Disposition - Home Condition Upon Discharge - Stable DVT Prophylaxis - Lovenox and TED hose  Reche Dixon, PA-C Orthopaedic Surgery 07/13/2022, 7:20 AM

## 2022-07-13 NOTE — Progress Notes (Signed)
Physical Therapy Treatment Patient Details Name: Rita Foster MRN: [id number] DOB: June 12, 1961 Today's Date: 07/13/2022   History of Present Illness Pt is a 62 yo F s/p elective R TKA 07/11/22. PMH includes HTN and HLD, L knee arthritis and ankle AVN.    PT Comments    Pt eager to work with PT and see if she is able to go home but ultimately she remains limited with most metrics for post-op TKA expectations for home.  Afternoon session split to see if she is optimization of pain meds helps her function but ultimately she remains more limited than we'd like.  Pt walked ~15 ft with great effort and plenty of cuing on the first attempt, about an hour after meds she managed ~35 ft but again with labored, slow, guarded gait, heavy UE reliance and poor overall tolerance.  Pt did not have buckling in knee with ambulation, but did self select maintaining TKE on R through all phases of gait.  Pt with AROM flexion in R knee to about 40 but with repeated reps and gentle PROM overpressure she did get to mid 70s.  Pt shows ability to engage quad relatively well but could not do full SAQ ROM and similarly could not lift R LE against gravity for SLRs.  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  Pt agrees that barring a big change in function in the next 24 hours she will likely need some time at a rehab facility.  Pt with good effort but slow overall progress.    Recommendations for follow up therapy are one component of a multi-disciplinary discharge planning process, led by the attending physician.  Recommendations may be updated based on patient status, additional functional criteria and insurance authorization.  Follow Up Recommendations  Skilled nursing-short term rehab (<3 hours/day) Can patient physically be transported by private vehicle: No   Assistance  Recommended at Discharge Intermittent Supervision/Assistance  Patient can return home with the following A little help with walking and/or transfers;A little help with bathing/dressing/bathroom;Help with stairs or ramp for entrance;Assist for transportation;Assistance with cooking/housework   Equipment Recommendations   (TBD at next venue of care)    Recommendations for Other Services       Precautions / Restrictions Precautions Precautions: Fall Restrictions Weight Bearing Restrictions: Yes RLE Weight Bearing: Weight bearing as tolerated Other Position/Activity Restrictions: pt struggling with SLRs, but did not have buckling with ambulation/WBing     Mobility  Bed Mobility Overal bed mobility: Needs Assistance Bed Mobility: Sit to Supine       Sit to supine: Min assist   General bed mobility comments: Pt initially unable to get R LE up into bed, cuing to use UEs and encourgement allowed her to nearly manage w/o assist, still with need for some help to adjust in bed    Transfers Overall transfer level: Needs assistance Equipment used: Rolling walker (2 wheels) Transfers: Sit to/from Stand Sit to Stand: Min assist           General transfer comment: continues to require extra time and vcs to set up for and actually stand. Struggles to effectively take any weight through R LE until actually up in the walker.    Ambulation/Gait Ambulation/Gait assistance: Min guard Gait Distance (Feet): 35 Feet Assistive device: Rolling walker (2 wheels)  General Gait Details: First bout of ambulation only tolerated 10 ft before requesting to sit down, ultimately encouraged to do a few more steps (to 63f). Heavy lean on the RW for support but generally steady with no LOB or buckling (also self selected maintaining TKE during swing through); mod verbal cues for step-to sequencing for pain control. Pt able to push farther to ~35 ft on second attempt after pain meds, but still  very slow, labored and quick to fatigue.   Stairs             Wheelchair Mobility    Modified Rankin (Stroke Patients Only)       Balance Overall balance assessment: Needs assistance Sitting-balance support: Feet supported Sitting balance-Leahy Scale: Normal     Standing balance support: Bilateral upper extremity supported, During functional activity, Reliant on assistive device for balance Standing balance-Leahy Scale: Fair Standing balance comment: reliant on walker, poor tolerance for time in standing                            Cognition Arousal/Alertness: Awake/alert Behavior During Therapy: WFL for tasks assessed/performed Overall Cognitive Status: Within Functional Limits for tasks assessed                                          Exercises Total Joint Exercises Quad Sets: Strengthening, 15 reps Short Arc Quad: AROM, AAROM, 10 reps (pt needing AAROM for first few reps, improved with some AROM but did not attain TKE w/o assist) Heel Slides: AAROM, 5 reps (with lightly resisted leg ext) Hip ABduction/ADduction: 10 reps, AAROM, AROM (limited AROM, struggles to initiate w/o at least some AAROM) Straight Leg Raises: AAROM, 5 reps (pt unable to AROM SLRs this session, good effort but fatigue and pain were limiters) Knee Flexion: PROM, 5 reps Goniometric ROM: 0-77    General Comments        Pertinent Vitals/Pain Pain Assessment Pain Assessment: 0-10 Pain Score: 7  Pain Location: R knee - pain better controlled during second half of PM session coordinated with meds    Home Living Family/patient expects to be discharged to:: Private residence Living Arrangements: Parent Available Help at Discharge: Family;Friend(s);Available PRN/intermittently (Pt lives with her father. He is at home 24/7 but has limited capacity to assist pt (pt serves as his primary caregiver).) Type of Home: House Home Access: Ramped entrance       Home  Layout: Two level;Able to live on main level with bedroom/bathroom Home Equipment: Rolling Walker (2 wheels);Cane - single point;Wheelchair - manual      Prior Function            PT Goals (current goals can now be found in the care plan section) Progress towards PT goals: Progressing toward goals    Frequency    BID      PT Plan Current plan remains appropriate    Co-evaluation              AM-PAC PT "6 Clicks" Mobility   Outcome Measure  Help needed turning from your back to your side while in a flat bed without using bedrails?: A Little Help needed moving from lying on your back to sitting on the side of a flat bed without using bedrails?: A Lot Help needed moving to and from a bed to a chair (including a wheelchair)?:  A Lot Help needed standing up from a chair using your arms (e.g., wheelchair or bedside chair)?: A Little Help needed to walk in hospital room?: A Little Help needed climbing 3-5 steps with a railing? : A Lot 6 Click Score: 15    End of Session Equipment Utilized During Treatment: Gait belt Activity Tolerance: Patient limited by pain Patient left: with call bell/phone within reach;with SCD's reapplied;with bed alarm set Nurse Communication: Mobility status;Weight bearing status PT Visit Diagnosis: Other abnormalities of gait and mobility (R26.89);Muscle weakness (generalized) (M62.81);Pain Pain - Right/Left: Right Pain - part of body: Knee     Time: 1530-1600, 1715-1800 PT Time Calculation (min) (ACUTE ONLY): 75 min  Charges:  $Gait Training: 23-37 mins $Therapeutic Exercise: 23-37 mins $Therapeutic Activity: 8-22 mins                     Kreg Shropshire, DPT 07/13/2022, 6:31 PM

## 2022-07-13 NOTE — Progress Notes (Signed)
  Subjective: 1 Day Post-Op Procedure(s) (LRB): COMPUTER ASSISTED TOTAL KNEE ARTHROPLASTY (Right) Patient reports pain as mild.   Patient is well, and has had no acute complaints or problems Plan is to go Home after hospital stay. Negative for chest pain and shortness of breath Fever: no Gastrointestinal: Negative for nausea and vomiting  Objective: Vital signs in last 24 hours: Temp:  [97.5 F (36.4 C)-98.8 F (37.1 C)] 98.1 F (36.7 C) (01/11 0332) Pulse Rate:  [52-75] 68 (01/11 0700) Resp:  [11-18] 16 (01/11 0332) BP: (109-156)/(61-99) 153/99 (01/11 0700) SpO2:  [96 %-100 %] 97 % (01/11 0332)  Intake/Output from previous day:  Intake/Output Summary (Last 24 hours) at 07/13/2022 0715 Last data filed at 07/13/2022 0523 Gross per 24 hour  Intake 4370 ml  Output 2875 ml  Net 1495 ml    Intake/Output this shift: No intake/output data recorded.  Labs: No results for input(s): "HGB" in the last 72 hours. No results for input(s): "WBC", "RBC", "HCT", "PLT" in the last 72 hours. No results for input(s): "NA", "K", "CL", "CO2", "BUN", "CREATININE", "GLUCOSE", "CALCIUM" in the last 72 hours. No results for input(s): "LABPT", "INR" in the last 72 hours.   EXAM General - Patient is Alert and Oriented Extremity - Neurovascular intact Sensation intact distally Dorsiflexion/Plantar flexion intact Compartment soft Dressing/Incision - clean, dry, with the Hemovac tubing removed with no complication.  The Hemovac tubing was intact on removal.  The wound VAC is in place. Motor Function - intact, moving foot and toes well on exam.  Able to straight leg raise with very minimal assistance.  Past Medical History:  Diagnosis Date   Asthma    Basal cell carcinoma 05/10/2022   Left nasal sidewall. Nodular type. Mohs pending.   Hyperlipidemia    Hypertension    Pre-diabetes     Assessment/Plan: 1 Day Post-Op Procedure(s) (LRB): COMPUTER ASSISTED TOTAL KNEE ARTHROPLASTY  (Right) Principal Problem:   Total knee replacement status  Estimated body mass index is 41.65 kg/m as calculated from the following:   Height as of this encounter: '5\' 8"'$  (1.727 m).   Weight as of this encounter: 124.2 kg. Advance diet Up with therapy D/C IV fluids  Discharge planning to discharge home with outpatient physical therapy.  DVT Prophylaxis - Lovenox, Foot Pumps, and TED hose Weight-Bearing as tolerated to right leg  Reche Dixon, PA-C Orthopaedic Surgery 07/13/2022, 7:15 AM

## 2022-07-13 NOTE — Progress Notes (Signed)
Discharge order canceled for today, order received by Reche Dixon, PA

## 2022-07-13 NOTE — Plan of Care (Signed)
  Problem: Pain Management: Goal: Pain level will decrease with appropriate interventions Outcome: Progressing   Problem: Skin Integrity: Goal: Will show signs of wound healing Outcome: Progressing   Problem: Activity: Goal: Risk for activity intolerance will decrease Outcome: Progressing   Problem: Nutrition: Goal: Adequate nutrition will be maintained Outcome: Progressing

## 2022-07-13 NOTE — Progress Notes (Signed)
Physical Therapy Treatment Patient Details Name: Rita Foster MRN: [id number] DOB: 07-22-1960 Today's Date: 07/13/2022   History of Present Illness Pt is a 62 yo F diagnosed with degenerative arthrosis of the right knee and is s/p elective R TKA.  PMH includes HTN and HLD.    PT Comments    Pt was sitting in recliner upon arriving. Per RN staff, she required extensive assistance to exit bed, stand and pivot to recliner. When author arrived to her room,she was endorsing severe pain. Rated pain 8/10 and also endorses fear/anxiety of Dcing today. A lot of time spent educating pt on expectation and on POC going forward. Reviewed polar care, wound vac, bone foam (tolerating some), and importance of ROM going forward. She states understanding and was able to present with AROM 3-82 degrees. Pain greatly impacted session progression. Endorses L (non op) ankle/knee pain with RN requesting antalgic cream that pt previous has taken. Pt did stand and ambulate however only 12 ft prior to needing to sit. No LOB during gait however extremely slow antalgic step to pattern. She endorses as much LLE pain as RLE. Author will return later today to continue to progress pt to PLOF. Currently pt is planning to DC home however if major progress is not made in next few PT sessions, will need to adjust DC recs. Pt lives with her 86 yr old father and only has a friend to check on her limited times during the day. MD/PA made aware.    Recommendations for follow up therapy are one component of a multi-disciplinary discharge planning process, led by the attending physician.  Recommendations may be updated based on patient status, additional functional criteria and insurance authorization.  Follow Up Recommendations  Follow physician's recommendations for discharge plan and follow up therapies     Assistance Recommended at Discharge Intermittent Supervision/Assistance  Patient can return home with the following A little  help with walking and/or transfers;A little help with bathing/dressing/bathroom;Help with stairs or ramp for entrance;Assist for transportation;Assistance with cooking/housework   Equipment Recommendations  BSC/3in1 (pt reports she is borrowing friends RW at Dc)       Precautions / Restrictions Precautions Precautions: Fall Restrictions Weight Bearing Restrictions: Yes RLE Weight Bearing: Weight bearing as tolerated     Mobility  Bed Mobility    General bed mobility comments: In recliner pre/post session    Transfers Overall transfer level: Needs assistance Equipment used: Rolling walker (2 wheels) Transfers: Sit to/from Stand Sit to Stand: Min assist  General transfer comment: pt does required extensive time + vcs to stand. Min physical assistance to acheive standing. poor intial standing posture however was able to correct.    Ambulation/Gait Ambulation/Gait assistance: Min guard Gait Distance (Feet): 12 Feet Assistive device: Rolling walker (2 wheels) Gait Pattern/deviations: Step-to pattern, Antalgic Gait velocity: decreased  General Gait Details: Heavy lean on the RW for support but generally steady with no LOB or buckling; mod verbal cues for step-to sequencing for pain control    Balance Overall balance assessment: Needs assistance Sitting-balance support: Feet supported Sitting balance-Leahy Scale: Normal     Standing balance support: Bilateral upper extremity supported, During functional activity Standing balance-Leahy Scale: Fair Standing balance comment: pain severely limiting pt       Cognition Arousal/Alertness: Awake/alert Behavior During Therapy: WFL for tasks assessed/performed Overall Cognitive Status: Within Functional Limits for tasks assessed      General Comments: Pt is A and O x 4  General Comments General comments (skin integrity, edema, etc.): reviewed HEP, polar care, bone foam, and positioning to promote improve ROM and  healing      Pertinent Vitals/Pain Pain Assessment Pain Assessment: 0-10 Pain Score: 8  Pain Location: R knee Pain Descriptors / Indicators: Sore, Sharp, Discomfort, Grimacing, Guarding, Operative site guarding Pain Intervention(s): Limited activity within patient's tolerance, Monitored during session, Premedicated before session     PT Goals (current goals can now be found in the care plan section) Acute Rehab PT Goals Patient Stated Goal: " Get better and return home." Progress towards PT goals: Not progressing toward goals - comment (pain limited)    Frequency    BID      PT Plan Current plan remains appropriate       AM-PAC PT "6 Clicks" Mobility   Outcome Measure  Help needed turning from your back to your side while in a flat bed without using bedrails?: A Little Help needed moving from lying on your back to sitting on the side of a flat bed without using bedrails?: A Lot Help needed moving to and from a bed to a chair (including a wheelchair)?: A Lot Help needed standing up from a chair using your arms (e.g., wheelchair or bedside chair)?: A Little Help needed to walk in hospital room?: A Little Help needed climbing 3-5 steps with a railing? : A Lot 6 Click Score: 15    End of Session Equipment Utilized During Treatment: Gait belt Activity Tolerance: Patient limited by pain Patient left: in chair;with call bell/phone within reach;with chair alarm set;with SCD's reapplied;Other (comment) Nurse Communication: Mobility status;Weight bearing status PT Visit Diagnosis: Other abnormalities of gait and mobility (R26.89);Muscle weakness (generalized) (M62.81);Pain Pain - Right/Left: Right Pain - part of body: Knee     Time: 5277-8242 PT Time Calculation (min) (ACUTE ONLY): 38 min  Charges:  $Gait Training: 8-22 mins $Therapeutic Exercise: 8-22 mins $Therapeutic Activity: 8-22 mins                     Julaine Fusi PTA 07/13/22, 11:56 AM

## 2022-07-13 NOTE — Plan of Care (Signed)

## 2022-07-13 NOTE — Progress Notes (Signed)
Patient is not able to walk the distance required to go the bathroom, or he/she is unable to safely negotiate stairs required to access the bathroom.  A 3in1 BSC will alleviate this problem  

## 2022-07-13 NOTE — NC FL2 (Signed)
[place] LEVEL OF CARE FORM     IDENTIFICATION  Patient Name: Rita Foster Birthdate: 1961-04-05 Sex: female Admission Date (Current Location): 07/12/2022  [facility] and [place] Number:  Engineering geologist and Address:  [facility], [address] [place], [place] [address]      Provider Number: [id number]  Attending Physician Name and Address:  Dereck Leep, MD  Relative Name and Phone Number:  Rip Harbour Father [phone]    Current Level of Care: Hospital Recommended Level of Care: [place] Prior Approval Number:    Date Approved/Denied:   PASRR Number: [id number]  Discharge Plan: SNF    Current Diagnoses: Patient Active Problem List   Diagnosis Date Noted   Total knee replacement status 07/12/2022   Annual physical exam 06/09/2022   Visit for screening mammogram 06/09/2022   Screening for cervical cancer 06/09/2022   Chronic pain of both knees 06/09/2022   AVN (avascular necrosis of bone) ([place]) 02/26/2022   Primary osteoarthritis of left knee 02/26/2022   Primary osteoarthritis of right knee 02/26/2022   Borderline diabetic 01/04/2022   Difficulty sleeping 01/04/2022   Essential hypertension 01/04/2022   Morbid obesity ([place]) 01/04/2022   Hypercholesteremia 04/02/2015   Herpes 06/19/2008    Orientation RESPIRATION BLADDER Height & Weight     Situation, Time, Self, Place  Normal Continent, External catheter Weight: 124.2 kg Height:  '5\' 8"'$  (172.7 cm)  BEHAVIORAL SYMPTOMS/MOOD NEUROLOGICAL BOWEL NUTRITION STATUS      Continent Diet (See dc summary)  AMBULATORY STATUS COMMUNICATION OF NEEDS Skin   Extensive Assist Verbally Normal, Surgical wounds                       Personal Care Assistance Level of Assistance  Bathing, Feeding, Dressing, Total care Bathing Assistance: Maximum assistance Feeding assistance: Limited assistance Dressing Assistance: Limited assistance     Functional  Limitations Info  Sight, Hearing, Speech Sight Info: Adequate Hearing Info: Adequate Speech Info: Adequate    SPECIAL CARE FACTORS FREQUENCY  PT (By licensed PT), OT (By licensed OT)     PT Frequency: 5 times per week OT Frequency: 5 times oer week            Contractures Contractures Info: Not present    Additional Factors Info  Code Status, Allergies Code Status Info: full code Allergies Info: Gabapentin, Mobic (Meloxicam), Penicillins           Current Medications (07/13/2022):  This is the current hospital active medication list Current Facility-Administered Medications  Medication Dose Route Frequency Provider Last Rate Last Admin   0.9 %  sodium chloride infusion   Intravenous Continuous Hooten, Laurice Record, MD   Stopped at 07/12/22 1446   acetaminophen (TYLENOL) tablet 325-650 mg  325-650 mg Oral Q6H PRN Hooten, Laurice Record, MD       acyclovir (ZOVIRAX) 200 MG capsule 400 mg  400 mg Oral BID Hooten, Laurice Record, MD   400 mg at 07/13/22 0909   albuterol (PROVENTIL) (2.5 MG/3ML) 0.083% nebulizer solution 3 mL  3 mL Inhalation Q6H PRN Hooten, Laurice Record, MD       alum & mag hydroxide-simeth (MAALOX/MYLANTA) 200-200-20 MG/5ML suspension 30 mL  30 mL Oral Q4H PRN Hooten, Laurice Record, MD       amLODipine (NORVASC) tablet 10 mg  10 mg Oral BH-q7a Hooten, Laurice Record, MD   10 mg at 07/13/22 0700   bisacodyl (DULCOLAX) suppository 10 mg  10 mg Rectal Daily PRN Hooten, Laurice Record, MD       celecoxib (CELEBREX) capsule 200 mg  200 mg Oral BID Dereck Leep, MD   200 mg at 07/13/22 3329   diclofenac Sodium (VOLTAREN) 1 % topical gel 4 g  4 g Topical QID Reche Dixon, PA-C   4 g at 07/13/22 1146   diphenhydrAMINE (BENADRYL) 12.5 MG/5ML elixir 12.5-25 mg  12.5-25 mg Oral Q4H PRN Hooten, Laurice Record, MD       enoxaparin (LOVENOX) injection 30 mg  30 mg Subcutaneous Q12H Hooten, Laurice Record, MD   30 mg at 07/13/22 5188   ferrous sulfate tablet 325 mg  325 mg Oral BID WC Dereck Leep, MD   325 mg at 07/13/22 4166    HYDROmorphone (DILAUDID) injection 0.5-1 mg  0.5-1 mg Intravenous Q4H PRN Dereck Leep, MD   1 mg at 07/12/22 2032   lactobacillus (FLORANEX/LACTINEX) granules 1 g  1 g Oral Daily Hooten, Laurice Record, MD       losartan (COZAAR) tablet 100 mg  100 mg Oral BH-q7a Hooten, Laurice Record, MD   100 mg at 07/13/22 0700   magnesium hydroxide (MILK OF MAGNESIA) suspension 30 mL  30 mL Oral Daily Hooten, Laurice Record, MD   30 mL at 07/13/22 0630   menthol-cetylpyridinium (CEPACOL) lozenge 3 mg  1 lozenge Oral PRN Hooten, Laurice Record, MD       Or   phenol (CHLORASEPTIC) mouth spray 1 spray  1 spray Mouth/Throat PRN Hooten, Laurice Record, MD       metoCLOPramide (REGLAN) tablet 10 mg  10 mg Oral TID AC & HS Hooten, Laurice Record, MD   10 mg at 07/13/22 0824   ondansetron (ZOFRAN) tablet 4 mg  4 mg Oral Q6H PRN Hooten, Laurice Record, MD       Or   ondansetron (ZOFRAN) injection 4 mg  4 mg Intravenous Q6H PRN Hooten, Laurice Record, MD       oxyCODONE (Oxy IR/ROXICODONE) immediate release tablet 10 mg  10 mg Oral Q4H PRN Dereck Leep, MD   10 mg at 07/13/22 1601   oxyCODONE (Oxy IR/ROXICODONE) immediate release tablet 5 mg  5 mg Oral Q4H PRN Hooten, Laurice Record, MD       pantoprazole (PROTONIX) EC tablet 40 mg  40 mg Oral BID Dereck Leep, MD   40 mg at 07/13/22 0908   rosuvastatin (CRESTOR) tablet 20 mg  20 mg Oral BH-q7a Hooten, Laurice Record, MD   20 mg at 07/13/22 0700   senna-docusate (Senokot-S) tablet 1 tablet  1 tablet Oral BID Dereck Leep, MD   1 tablet at 07/13/22 0908   sodium phosphate (FLEET) 7-19 GM/118ML enema 1 enema  1 enema Rectal Once PRN Hooten, Laurice Record, MD       traMADol Veatrice Bourbon) tablet 50-100 mg  50-100 mg Oral Q4H PRN Dereck Leep, MD   100 mg at 07/12/22 1627   traZODone (DESYREL) tablet 50 mg  50 mg Oral QHS Dereck Leep, MD   50 mg at 07/12/22 2237     Discharge Medications: Please see discharge summary for a list of discharge medications.  Relevant Imaging Results:  Relevant Lab Results:   Additional  Information SS# [id number]  Conception Oms, RN

## 2022-07-13 NOTE — Anesthesia Postprocedure Evaluation (Signed)
Anesthesia Post Note  Patient: Rita Foster  Procedure(s) Performed: COMPUTER ASSISTED TOTAL KNEE ARTHROPLASTY (Right: Knee)  Patient location during evaluation: Nursing Unit Anesthesia Type: Spinal Level of consciousness: awake, awake and alert, oriented and patient cooperative Pain management: pain level controlled Vital Signs Assessment: post-procedure vital signs reviewed and stable Respiratory status: spontaneous breathing Cardiovascular status: stable Postop Assessment: no headache, no backache, patient able to bend at knees, no apparent nausea or vomiting and adequate PO intake Anesthetic complications: no   No notable events documented.   Last Vitals:  Vitals:   07/13/22 0332 07/13/22 0700  BP: 134/69 (!) 153/99  Pulse: 60 68  Resp: 16   Temp: 36.7 C   SpO2: 97%     Last Pain:  Vitals:   07/13/22 0454  TempSrc:   PainSc: 5                  Shayla Heming,  Lyndall Windt R

## 2022-07-13 NOTE — TOC Progression Note (Signed)
Transition of Care [facility]) - Progression Note    Patient Details  Name: Rita Foster MRN: [id number] Date of Birth: 04/05/1961  Transition of [facility] CM/SW [place], RN Phone Number: 07/13/2022, 3:23 PM  Clinical Narrative:    Per OT, the 3 in 1 delivered needs to be a bariatric, I notified Rhonda at [employer] to switch   Expected Discharge Plan: OP Rehab Barriers to Discharge: Barriers Resolved  Expected Discharge Plan and Services   Discharge Planning Services: CM Consult     Expected Discharge Date: 07/13/22               DME Arranged: 3-N-1 DME Agency: [employer] Date DME Agency Contacted: 07/13/22 Time DME Agency Contacted: [phone] Representative spoke with at DME Agency: Lou Cal Arranged: NA           Social Determinants of Health ([place]) Interventions SDOH Screenings   Food Insecurity: No Food Insecurity (07/12/2022)  Housing: Low Risk  (07/12/2022)  Transportation Needs: No Transportation Needs (07/12/2022)  Utilities: Not At Risk (07/12/2022)  Alcohol Screen: Low Risk  (01/04/2022)  Depression (PHQ2-9): Low Risk  (01/04/2022)  Tobacco Use: Medium Risk (07/12/2022)    Readmission Risk Interventions     No data to display

## 2022-07-13 NOTE — Progress Notes (Signed)
The patient is not able to get [facility] due to her Insurance, she is set up with outpatient PT She has a rolling walker at home [employer] will deliver a 3 in 1

## 2022-07-14 DIAGNOSIS — M1711 Unilateral primary osteoarthritis, right knee: Secondary | ICD-10-CM | POA: Diagnosis not present

## 2022-07-14 NOTE — Progress Notes (Signed)
Physical Therapy Treatment Patient Details Name: Rita Foster MRN: [id number] DOB: 1961/04/10 Today's Date: 07/14/2022   History of Present Illness Pt is a 62 yo F s/p elective R TKA 07/11/22. PMH includes HTN and HLD, L knee arthritis and ankle AVN.    PT Comments    Pt was sitting in recliner, premedicated, and agreeable to session. She remains A and O x 4. Overall she demonstrated much improved abilities and activity tolerance. Pain has been most limiting factor since surgery. She was able to transfer and ambulate today. No physical lifting assistance required however pt is limited by pain and fatigue. Reviewed all post acute instructions from a PT standpoint. She states understanding. Pt was able to actively flex knee to ~ 84 degrees after ambulating. She did confirm she has a friend willing to stay and assist her at DC. Unable to have HHPT but is planning to start OP PT this Monday. Acute PT will continue to follow and progress as able per current POC.   Recommendations for follow up therapy are one component of a multi-disciplinary discharge planning process, led by the attending physician.  Recommendations may be updated based on patient status, additional functional criteria and insurance authorization.  Follow Up Recommendations  Follow physician's recommendations for discharge plan and follow up therapies (Pt is planning to DC home today with OP PT to start Monday) Can patient physically be transported by private vehicle: No   Assistance Recommended at Discharge Intermittent Supervision/Assistance  Patient can return home with the following A little help with walking and/or transfers;A little help with bathing/dressing/bathroom;Help with stairs or ramp for entrance;Assist for transportation;Assistance with cooking/housework   Equipment Recommendations  BSC/3in1;Rolling walker (2 wheels) (bariatric BSC due to body habitus)       Precautions / Restrictions  Precautions Precautions: Fall Restrictions Weight Bearing Restrictions: Yes RLE Weight Bearing: Weight bearing as tolerated     Mobility  Bed Mobility  General bed mobility comments: In recliner pre/post session. Endorses requiring extensive assistance to get OOB earlier. Discussed pt sleeping in recliner at home as option.    Transfers Overall transfer level: Needs assistance Equipment used: Rolling walker (2 wheels) Transfers: Sit to/from Stand Sit to Stand: Min guard      General transfer comment: no physical lifting assistance. Pt overall moving better than previous date. Has been pain limited overall. Strength is good and safety awarness is good as well. she performed STS 2 x from recliner and 2 x from [facility].    Ambulation/Gait Ambulation/Gait assistance: Min guard, Supervision Gait Distance (Feet): 75 Feet Assistive device: Rolling walker (2 wheels) Gait Pattern/deviations: Step-to pattern, Antalgic Gait velocity: decreased     General Gait Details: Much improved gait tolerance today versus previous date. Distance is limited by fatigue and pain, not strength or safety concerns    Balance Overall balance assessment: Needs assistance Sitting-balance support: Feet supported Sitting balance-Leahy Scale: Normal     Standing balance support: Bilateral upper extremity supported, During functional activity, Reliant on assistive device for balance Standing balance-Leahy Scale: Good Standing balance comment: no LOB with BUE support on RW       Cognition Arousal/Alertness: Awake/alert Behavior During Therapy: WFL for tasks assessed/performed Overall Cognitive Status: Within Functional Limits for tasks assessed    General Comments: Pt is A and O x 4        Exercises Total Joint Exercises Goniometric ROM: 2-84    General Comments General comments (skin integrity, edema, etc.): Lengthy discussion about DC and  all the expectations going forward. She states understanding  of proper use of polar care, positioning, stretching, importance of HEP, and overall importance of routine mobility.      Pertinent Vitals/Pain Pain Assessment Pain Assessment: No/denies pain     PT Goals (current goals can now be found in the care plan section) Acute Rehab PT Goals Patient Stated Goal: move better with less pain Progress towards PT goals: Progressing toward goals    Frequency    BID      PT Plan Discharge plan needs to be updated       AM-PAC PT "6 Clicks" Mobility   Outcome Measure  Help needed turning from your back to your side while in a flat bed without using bedrails?: A Little Help needed moving from lying on your back to sitting on the side of a flat bed without using bedrails?: A Little Help needed moving to and from a bed to a chair (including a wheelchair)?: A Little Help needed standing up from a chair using your arms (e.g., wheelchair or bedside chair)?: A Little Help needed to walk in hospital room?: A Little Help needed climbing 3-5 steps with a railing? : A Lot 6 Click Score: 17    End of Session   Activity Tolerance: Patient tolerated treatment well Patient left: in chair;with call bell/phone within reach;with chair alarm set;with nursing/sitter in room Nurse Communication: Mobility status;Weight bearing status PT Visit Diagnosis: Other abnormalities of gait and mobility (R26.89);Muscle weakness (generalized) (M62.81);Pain Pain - Right/Left: Right Pain - part of body: Knee     Time: 5701-7793 PT Time Calculation (min) (ACUTE ONLY): 36 min  Charges:  $Gait Training: 8-22 mins $Therapeutic Activity: 8-22 mins                     Julaine Fusi PTA 07/14/22, 11:24 AM

## 2022-07-14 NOTE — Plan of Care (Signed)
  Problem: Education: ?Goal: Knowledge of the prescribed therapeutic regimen will improve ?Outcome: Progressing ?Goal: Individualized Educational Video(s) ?Outcome: Progressing ?  ?Problem: Activity: ?Goal: Ability to avoid complications of mobility impairment will improve ?Outcome: Progressing ?Goal: Range of joint motion will improve ?Outcome: Progressing ?  ?Problem: Clinical Measurements: ?Goal: Postoperative complications will be avoided or minimized ?Outcome: Progressing ?  ?Problem: Pain Management: ?Goal: Pain level will decrease with appropriate interventions ?Outcome: Progressing ?  ?Problem: Skin Integrity: ?Goal: Will show signs of wound healing ?Outcome: Progressing ?  ?Problem: Education: ?Goal: Knowledge of General Education information will improve ?Description: Including pain rating scale, medication(s)/side effects and non-pharmacologic comfort measures ?Outcome: Progressing ?  ?Problem: Health Behavior/Discharge Planning: ?Goal: Ability to manage health-related needs will improve ?Outcome: Progressing ?  ?Problem: Clinical Measurements: ?Goal: Ability to maintain clinical measurements within normal limits will improve ?Outcome: Progressing ?Goal: Will remain free from infection ?Outcome: Progressing ?Goal: Diagnostic test results will improve ?Outcome: Progressing ?Goal: Respiratory complications will improve ?Outcome: Progressing ?Goal: Cardiovascular complication will be avoided ?Outcome: Progressing ?  ?Problem: Activity: ?Goal: Risk for activity intolerance will decrease ?Outcome: Progressing ?  ?Problem: Nutrition: ?Goal: Adequate nutrition will be maintained ?Outcome: Progressing ?  ?Problem: Coping: ?Goal: Level of anxiety will decrease ?Outcome: Progressing ?  ?Problem: Elimination: ?Goal: Will not experience complications related to bowel motility ?Outcome: Progressing ?Goal: Will not experience complications related to urinary retention ?Outcome: Progressing ?  ?Problem: Pain  Managment: ?Goal: General experience of comfort will improve ?Outcome: Progressing ?  ?Problem: Safety: ?Goal: Ability to remain free from injury will improve ?Outcome: Progressing ?  ?Problem: Skin Integrity: ?Goal: Risk for impaired skin integrity will decrease ?Outcome: Progressing ?  ?

## 2022-07-14 NOTE — Plan of Care (Signed)
  Problem: Education: Goal: Knowledge of the prescribed therapeutic regimen will improve Outcome: Progressing   Problem: Activity: Goal: Ability to avoid complications of mobility impairment will improve Outcome: Progressing   Problem: Skin Integrity: Goal: Will show signs of wound healing Outcome: Progressing   Problem: Education: Goal: Knowledge of General Education information will improve Description: Including pain rating scale, medication(s)/side effects and non-pharmacologic comfort measures Outcome: Progressing   Problem: Activity: Goal: Risk for activity intolerance will decrease Outcome: Progressing   Problem: Nutrition: Goal: Adequate nutrition will be maintained Outcome: Progressing   Problem: Pain Managment: Goal: General experience of comfort will improve Outcome: Progressing   Problem: Safety: Goal: Ability to remain free from injury will improve Outcome: Progressing

## 2022-07-14 NOTE — TOC Progression Note (Signed)
Transition of Care [facility]) - Progression Note    Patient Details  Name: Rita Foster MRN: [id number] Date of Birth: 24-Jun-1961  Transition of Care [facility]) CM/SW [place], RN Phone Number: 07/14/2022, 10:16 AM  Clinical Narrative:     The patient improved today and has a friend that will stay with her, she plans to go home, She has an appointment for outpatient PT Monday, a bariatric 3 in1 to be delivered as well as a rolling walker by [employer]  Expected Discharge Plan: OP Rehab Barriers to Discharge: Barriers Resolved  Expected Discharge Plan and Services   Discharge Planning Services: CM Consult     Expected Discharge Date: 07/14/22               DME Arranged: Gilford Rile rolling DME Agency: [employer] Date DME Agency Contacted: 07/13/22 Time DME Agency Contacted: [phone] Representative spoke with at DME Agency: Lou Cal Arranged: NA           Social Determinants of Health ([name]) Interventions SDOH Screenings   Food Insecurity: No Food Insecurity (07/12/2022)  Housing: Low Risk  (07/12/2022)  Transportation Needs: No Transportation Needs (07/12/2022)  Utilities: Not At Risk (07/12/2022)  Alcohol Screen: Low Risk  (01/04/2022)  Depression (PHQ2-9): Low Risk  (01/04/2022)  Tobacco Use: Medium Risk (07/12/2022)    Readmission Risk Interventions     No data to display

## 2022-07-14 NOTE — Progress Notes (Signed)
  Subjective: 2 Days Post-Op Procedure(s) (LRB): COMPUTER ASSISTED TOTAL KNEE ARTHROPLASTY (Right) Patient reports pain as mild.   Patient is well, and has had no acute complaints or problems Plan is to go Home after hospital stay.  She has several friends that are going to help her at home after surgery and with her father. Negative for chest pain and shortness of breath Fever: no Gastrointestinal: Negative for nausea and vomiting  Objective: Vital signs in last 24 hours: Temp:  [98.1 F (36.7 C)-98.8 F (37.1 C)] 98.1 F (36.7 C) (01/12 0107) Pulse Rate:  [71-92] 77 (01/12 0107) Resp:  [17-18] 18 (01/12 0107) BP: (139-164)/(73-125) 141/125 (01/12 0107) SpO2:  [98 %-99 %] 98 % (01/12 0107)  Intake/Output from previous day:  Intake/Output Summary (Last 24 hours) at 07/14/2022 0703 Last data filed at 07/13/2022 1511 Gross per 24 hour  Intake 100 ml  Output --  Net 100 ml    Intake/Output this shift: No intake/output data recorded.  Labs: No results for input(s): "HGB" in the last 72 hours. No results for input(s): "WBC", "RBC", "HCT", "PLT" in the last 72 hours. No results for input(s): "NA", "K", "CL", "CO2", "BUN", "CREATININE", "GLUCOSE", "CALCIUM" in the last 72 hours. No results for input(s): "LABPT", "INR" in the last 72 hours.   EXAM General - Patient is Alert and Oriented Extremity - Neurovascular intact Sensation intact distally Dorsiflexion/Plantar flexion intact Compartment soft Dressing/Incision - clean, dry, with the Hemovac tubing removed with no complication.  The Hemovac tubing was intact on removal.  The wound VAC is in place. Motor Function - intact, moving foot and toes well on exam.  Able to straight leg raise with very minimal assistance.  Ambulated 35 feet with physical therapy  Past Medical History:  Diagnosis Date   Asthma    Basal cell carcinoma 05/10/2022   Left nasal sidewall. Nodular type. Mohs pending.   Hyperlipidemia    Hypertension     Pre-diabetes     Assessment/Plan: 2 Days Post-Op Procedure(s) (LRB): COMPUTER ASSISTED TOTAL KNEE ARTHROPLASTY (Right) Principal Problem:   Total knee replacement status  Estimated body mass index is 41.65 kg/m as calculated from the following:   Height as of this encounter: '5\' 8"'$  (1.727 m).   Weight as of this encounter: 124.2 kg. Advance diet Up with therapy D/C IV fluids  Discharge planning to discharge home with outpatient physical therapy.  DVT Prophylaxis - Lovenox, Foot Pumps, and TED hose Weight-Bearing as tolerated to right leg  Reche Dixon, PA-C Orthopaedic Surgery 07/14/2022, 7:03 AM

## 2022-07-14 NOTE — Progress Notes (Signed)
Pt discharged with belongings bag as well as bedside commode and walker. Transportation provided via friend. IV removed and pt wheel downed to medical mall entrance.

## 2022-07-17 ENCOUNTER — Telehealth: Payer: Self-pay

## 2022-07-17 NOTE — TOC Progression Note (Signed)
Transition of Care [facility]) - Progression Note    Patient Details  Name: DENISIA HARPOLE MRN: [id number] Date of Birth: 09/12/1960  Transition of Care [facility]) CM/SW [place], RN Phone Number: 07/17/2022, 12:01 PM  Clinical Narrative:    Reached out to Dr Clydell Hakim office, they will fax a bariatric RW order to [employer] to [phone]   Expected Discharge Plan: OP Rehab Barriers to Discharge: Barriers Resolved  Expected Discharge Plan and Services   Discharge Planning Services: CM Consult     Expected Discharge Date: 07/14/22               DME Arranged: Gilford Rile rolling DME Agency: [employer] Date DME Agency Contacted: 07/13/22 Time DME Agency Contacted: [phone] Representative spoke with at DME Agency: Lou Cal Arranged: NA           Social Determinants of Health ([place]) Interventions SDOH Screenings   Food Insecurity: No Food Insecurity (07/12/2022)  Housing: Low Risk  (07/12/2022)  Transportation Needs: No Transportation Needs (07/12/2022)  Utilities: Not At Risk (07/12/2022)  Alcohol Screen: Low Risk  (01/04/2022)  Depression (PHQ2-9): Low Risk  (01/04/2022)  Tobacco Use: Medium Risk (07/12/2022)    Readmission Risk Interventions     No data to display

## 2022-07-17 NOTE — TOC Progression Note (Signed)
Transition of Care [facility]) - Progression Note    Patient Details  Name: Rita Foster MRN: [id number] Date of Birth: [birth year]  Transition of Care [facility]) CM/SW [place], RN Phone Number: 07/17/2022, 3:15 PM  Clinical Narrative:    [employer] reports that they received a copy of the original order for the regular size Rw, they need an order for the bariatric, I called Dr Clydell Hakim office and requested that they fax an order for the bariatric rolling walker [phone]   Expected Discharge Plan: OP Rehab Barriers to Discharge: Barriers Resolved  Expected Discharge Plan and Services   Discharge Planning Services: CM Consult     Expected Discharge Date: 07/14/22               DME Arranged: Gilford Rile rolling DME Agency: [employer] Date DME Agency Contacted: 07/13/22 Time DME Agency Contacted: [phone] Representative spoke with at DME Agency: Lou Cal Arranged: NA           Social Determinants of Health ([place]) Interventions SDOH Screenings   Food Insecurity: No Food Insecurity (07/12/2022)  Housing: Low Risk  (07/12/2022)  Transportation Needs: No Transportation Needs (07/12/2022)  Utilities: Not At Risk (07/12/2022)  Alcohol Screen: Low Risk  (01/04/2022)  Depression (PHQ2-9): Low Risk  (01/04/2022)  Tobacco Use: Medium Risk (07/12/2022)    Readmission Risk Interventions     No data to display

## 2022-07-17 NOTE — Telephone Encounter (Signed)
Transition Care Management Unsuccessful Follow-up Telephone Call  Date of discharge and from where:  [facility] 1.12.24  Attempts:  1st Attempt  Reason for unsuccessful TCM follow-up call:  Left voice message

## 2022-07-18 ENCOUNTER — Telehealth: Payer: Self-pay

## 2022-07-18 NOTE — TOC Progression Note (Signed)
Transition of Care [facility]) - Progression Note    Patient Details  Name: Rita Foster MRN: [id number] Date of Birth: 01-Jun-1961  Transition of Care [facility]) CM/SW [place], RN Phone Number: 07/18/2022, 3:07 PM  Clinical Narrative:    [facility] asked if they said that she does not have a follow up appointment scheduled, He is checking to see if they would be able to put in the order for the rolling walker bariatric size  He left a message for the physician I reached out to Dr Clydell Hakim office again and explained that they resent the same order for a regular rolling walker and she needs a faxed order for the bariatric rolling walker    Fax for [employer] [phone]    Expected Discharge Plan: OP Rehab Barriers to Discharge: Barriers Resolved  Expected Discharge Plan and Services   Discharge Planning Services: CM Consult     Expected Discharge Date: 07/14/22               DME Arranged: Gilford Rile rolling DME Agency: [employer] Date DME Agency Contacted: 07/13/22 Time DME Agency Contacted: [phone] Representative spoke with at DME Agency: Lou Cal Arranged: NA           Social Determinants of Health ([place]) Interventions SDOH Screenings   Food Insecurity: No Food Insecurity (07/12/2022)  Housing: Low Risk  (07/12/2022)  Transportation Needs: No Transportation Needs (07/12/2022)  Utilities: Not At Risk (07/12/2022)  Alcohol Screen: Low Risk  (01/04/2022)  Depression (PHQ2-9): Low Risk  (01/04/2022)  Tobacco Use: Medium Risk (07/12/2022)    Readmission Risk Interventions     No data to display

## 2022-07-18 NOTE — Telephone Encounter (Signed)
Copied from [place] [id number]. Topic: General - Inquiry >> Jul 18, 2022  3:17 PM Erskine Squibb wrote: Reason for CRM: Delilah Case Manger from [facility] called about a patient that was discharged on Friday 07/14/22 and who had surgery. She was prescribed the regular rolling walker and not the bariatric rolling walker. The insurance would need an updated script and the surgeon said since it has been past 2 hours after patient was discharged that it would have to come from the patients primary care physician. The order would need to be faxed to [place] at [phone]

## 2022-07-18 NOTE — Telephone Encounter (Signed)
Transition Care Management Unsuccessful Follow-up Telephone Call  Date of discharge and from where:  [facility] 1.12.24  Attempts:  2nd Attempt  Reason for unsuccessful TCM follow-up call:  Left voice message

## 2022-07-21 NOTE — Telephone Encounter (Signed)
Transition Care Management Follow-up Telephone Call Date of discharge and from where: [facility] 07/14/22 How have you been since you were released from the hospital? Getting better Any questions or concerns? No  Items Reviewed: Did the pt receive and understand the discharge instructions provided? Yes  Medications obtained and verified? Yes  Other?  Any new allergies since your discharge? No  Dietary orders reviewed? Yes Do you have support at home? Yes   Functional Questionnaire: (I = Independent and D = Dependent) ADLs: D  Bathing/Dressing- I  Meal Prep- D  Eating- I  Maintaining continence- I  Transferring/Ambulation- D  Managing Meds- I  Follow up appointments reviewed:  PCP Hospital f/u appt confirmed? No   Specialist Hospital f/u appt confirmed? Yes  Scheduled to see PA Next week Are transportation arrangements needed? No  If their condition worsens, is the pt aware to call PCP or go to the Emergency Dept.? Yes Was the patient provided with contact information for the PCP's office or ED? Yes Was to pt encouraged to call back with questions or concerns? Yes

## 2022-09-20 ENCOUNTER — Other Ambulatory Visit: Payer: Self-pay | Admitting: Family Medicine

## 2022-09-20 DIAGNOSIS — B009 Herpesviral infection, unspecified: Secondary | ICD-10-CM

## 2022-09-20 DIAGNOSIS — E7800 Pure hypercholesterolemia, unspecified: Secondary | ICD-10-CM

## 2022-09-20 MED ORDER — ROSUVASTATIN CALCIUM 20 MG PO TABS: 20.0000 mg | ORAL_TABLET | Freq: Every day | ORAL | 3 refills | Status: DC

## 2022-09-20 MED ORDER — ACYCLOVIR 400 MG PO TABS: 400.0000 mg | ORAL_TABLET | ORAL | 3 refills | Status: DC

## 2022-09-22 ENCOUNTER — Telehealth: Payer: Self-pay | Admitting: Family Medicine

## 2022-09-22 NOTE — Telephone Encounter (Signed)
Please advise 

## 2022-09-22 NOTE — Telephone Encounter (Signed)
[facility] pharmacy faxed refill request for the following medications:  losartan (COZAAR) 100 MG tablet    traZODone (DESYREL) 50 MG tablet   amLODipine (NORVASC) 10 MG tablet   Please advise

## 2022-09-23 ENCOUNTER — Encounter (HOSPITAL_COMMUNITY): Payer: Self-pay | Admitting: Urgent Care

## 2022-09-24 NOTE — Discharge Instructions (Signed)
Instructions after Total Knee Replacement   Rita Foster P. [name], Jr., M.D.     Dept. of Orthopaedics & Sports Medicine  [facility]  [address]  [place], [place]  [address]  Phone: [phone]   Fax: [phone]    DIET: Drink plenty of non-alcoholic fluids. Resume your normal diet. Include foods high in fiber.  ACTIVITY:  You may use crutches or a walker with weight-bearing as tolerated, unless instructed otherwise. You may be weaned off of the walker or crutches by your Physical Therapist.  Do NOT place pillows under the knee. Anything placed under the knee could limit your ability to straighten the knee.   Continue doing gentle exercises. Exercising will reduce the pain and swelling, increase motion, and prevent muscle weakness.   Please continue to use the TED compression stockings for 6 weeks. You may remove the stockings at night, but should reapply them in the morning. Do not drive or operate any equipment until instructed.  WOUND CARE:  Continue to use the PolarCare or ice packs periodically to reduce pain and swelling. You may bathe or shower after the staples are removed at the first office visit following surgery.  MEDICATIONS: You may resume your regular medications. Please take the pain medication as prescribed on the medication. Do not take pain medication on an empty stomach. You have been given a prescription for a blood thinner (Lovenox or Coumadin). Please take the medication as instructed. (NOTE: After completing a 2 week course of Lovenox, take one Enteric-coated aspirin once a day. This along with elevation will help reduce the possibility of phlebitis in your operated leg.) Do not drive or drink alcoholic beverages when taking pain medications.  CALL THE OFFICE FOR: Temperature above 101 degrees Excessive bleeding or drainage on the dressing. Excessive swelling, coldness, or paleness of the toes. Persistent nausea and vomiting.  FOLLOW-UP:  You  should have an appointment to return to the office in 10-14 days after surgery. Arrangements have been made for continuation of Physical Therapy (either home therapy or outpatient therapy).     [facility] Department Directory         [email or web address]       [email or web address]          Cardiology  Appointments: [place] - [phone] [place] - [phone]  Endocrinology  Appointments: [place] - [phone] [place] - [phone]  Gastroenterology  Appointments: [place] - [phone] [place] - [phone]        General Surgery   Appointments: [place] - [phone]  Internal Medicine/Family Medicine  Appointments: [place] - [phone] [place] - [phone] [place] - [phone]  Metabolic and Weigh Loss Surgery  Appointments: [place] - [phone]        Neurology  Appointments: [place] - [phone] [place] - [phone]  Neurosurgery  Appointments: [place] - [phone]  Obstetrics & Gynecology  Appointments: [place] - [phone] [place] - [phone]        Pediatrics  Appointments: [place] - [phone] [place] - [phone]  Physiatry  Appointments: [place] -[phone]  Physical Therapy  Appointments: [place] - [phone] [place] - [phone]        Podiatry  Appointments: [place] - [phone] [place] - [phone]  Pulmonology  Appointments: [place] - [phone]  Rheumatology  Appointments: [place] - [phone]         Location: [facility]  [address] [place]  [address]  [place] Location: [facility] [address] [place], [place]  [address]  [place] Location: [facility] [address] [place],   [address]    

## 2022-09-26 ENCOUNTER — Other Ambulatory Visit: Payer: Managed Care, Other (non HMO)

## 2022-09-29 ENCOUNTER — Inpatient Hospital Stay: Admission: RE | Admit: 2022-09-29 | Payer: Managed Care, Other (non HMO) | Source: Ambulatory Visit

## 2022-10-09 ENCOUNTER — Other Ambulatory Visit: Payer: Self-pay | Admitting: Family Medicine

## 2022-10-09 ENCOUNTER — Ambulatory Visit: Admit: 2022-10-09 | Payer: Managed Care, Other (non HMO) | Admitting: Orthopedic Surgery

## 2022-10-09 DIAGNOSIS — R7303 Prediabetes: Secondary | ICD-10-CM

## 2022-10-09 DIAGNOSIS — G479 Sleep disorder, unspecified: Secondary | ICD-10-CM

## 2022-10-09 DIAGNOSIS — M1712 Unilateral primary osteoarthritis, left knee: Secondary | ICD-10-CM

## 2022-10-09 DIAGNOSIS — I10 Essential (primary) hypertension: Secondary | ICD-10-CM

## 2022-10-09 SURGERY — ARTHROPLASTY, KNEE, TOTAL, USING IMAGELESS COMPUTER-ASSISTED NAVIGATION
Anesthesia: Choice | Site: Knee | Laterality: Left

## 2022-10-09 NOTE — Telephone Encounter (Signed)
[facility] pharmacy faxed refill request for the following medications:   traZODone (DESYREL) 50 MG tablet    amLODipine (NORVASC) 10 MG tablet      losartan (COZAAR) 100 MG tablet  Please advise

## 2022-10-11 MED ORDER — TRAZODONE HCL 50 MG PO TABS: 50.0000 mg | ORAL_TABLET | Freq: Every evening | ORAL | 2 refills | Status: DC | PRN

## 2022-10-11 MED ORDER — LOSARTAN POTASSIUM 100 MG PO TABS: 100.0000 mg | ORAL_TABLET | ORAL | 2 refills | Status: DC

## 2022-10-11 MED ORDER — AMLODIPINE BESYLATE 10 MG PO TABS: 10.0000 mg | ORAL_TABLET | ORAL | 2 refills | Status: DC

## 2022-11-01 ENCOUNTER — Other Ambulatory Visit: Payer: Self-pay | Admitting: Family Medicine

## 2022-11-01 DIAGNOSIS — I10 Essential (primary) hypertension: Secondary | ICD-10-CM

## 2022-11-01 NOTE — Telephone Encounter (Signed)
Unable to refill per protocol, Rx request is too soon. Last refill 10/11/22 for 90 and 2 refills.  Requested Prescriptions  Pending Prescriptions Disp Refills   amLODipine (NORVASC) 10 MG tablet [Pharmacy Med Name: AMLODIPINE BESYLATE 10MG  TABLETS] 90 tablet 2    Sig: TAKE 1 TABLET(10 MG) BY MOUTH DAILY     Cardiovascular: Calcium Channel Blockers 2 Failed - 11/01/2022  9:52 AM      Failed - Last BP in normal range    BP Readings from Last 1 Encounters:  07/14/22 (!) 148/79         Passed - Last Heart Rate in normal range    Pulse Readings from Last 1 Encounters:  07/14/22 69         Passed - Valid encounter within last 6 months    Recent Outpatient Visits           4 months ago Annual physical exam   [facility] Jacky Kindle, FNP   10 months ago Morbid obesity [facility])   [place] [facility] Jacky Kindle, FNP   1 year ago Annual physical exam   [facility] Jacky Kindle, FNP   1 year ago Chronic pain of both knees   [facility] [place], [place], [place]   2 years ago Difficulty sleeping   [facility] [place], [place], [place]

## 2022-11-23 ENCOUNTER — Encounter: Payer: Managed Care, Other (non HMO) | Admitting: Dermatology

## 2022-12-05 ENCOUNTER — Telehealth: Payer: Self-pay | Admitting: Dermatology

## 2022-12-05 NOTE — Telephone Encounter (Signed)
Please call patient regarding her BCC at the nose which was not treated at [facility] Mohs. Please advise I strongly recommend rescheduling Mohs there are at [facility] [place] or at [facility] as these cancers can eat into the cartilage and even the skull and rarely becoming life threatening (cause death). There are other treatments we could consider if she does not feel up for Mohs surgery, but the cure rates are not as high. If she wishes to consider other treatment options, one of the physicians could call her to discuss or see her in follow-up. If she does not wish to have this treated, please document that the above was discussed including that untreated BCCs can become life threatening.   Thank you!

## 2022-12-07 ENCOUNTER — Encounter: Payer: Self-pay | Admitting: Dermatology

## 2022-12-07 NOTE — Telephone Encounter (Signed)
Thank you, Amanda! 

## 2022-12-07 NOTE — Telephone Encounter (Signed)
Called patient and left voicemail requesting call back

## 2022-12-07 NOTE — Telephone Encounter (Signed)
Spoke with patient. 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 Patient is going through a lot at this time dealing with his death and taking care of his belongings. Patient has contact at [facility] to call back and reschedule. She plans on rescheduling soon. aw

## 2022-12-15 ENCOUNTER — Telehealth: Payer: Self-pay | Admitting: Dermatology

## 2022-12-15 NOTE — Telephone Encounter (Addendum)
Please send a certified letter. Thank you!   CC  Merita Norton, FNP   Dear Rita Foster,  I hope this letter finds you well.   It has been my sincere pleasure to be a part of your healthcare team. At the time of my leaving, our records show you have a basal cell skin cancer of the left nose which has not been treated yet.   Since I will be unable to follow-up with you in future, I am writing to stress the importance of rescheduling your Mohs surgery appointment and being sure this spot is treated. When treated, skin cancer typically has very good outcomes. However, if skin cancer is left untreated it can pose serious health risks including destroying nearby tissue or even death.    If for any reason you are unable to go to your appointment or if you do not have your appointment scheduled, please reach out to [facility] Mohs surgery at [phone].   You can also reach out to our office at [phone] option 4 or through MyChart if you need assistance.   If you have any concerns about your treatment and wish to discuss other treatment options, please reach out to our office as well. You can reach [place] by phone at [phone] option 4 or through [employer].    As I am transitioning from the practice, I regret that I will not be able to continue with your care personally. However, I am confident you are in capable hands with my colleagues and the team at [facility].    Thank you for entrusting me with your skin health. 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 Please do not hesitate to reach out if you have any questions or concerns.   Wishing you continued health and well-being.   Sincerely,   Sandi Mealy, MD, MPH Dermatologist [facility] [phone] option 4

## 2023-01-04 ENCOUNTER — Other Ambulatory Visit: Payer: Self-pay | Admitting: Family Medicine

## 2023-01-04 DIAGNOSIS — G479 Sleep disorder, unspecified: Secondary | ICD-10-CM

## 2023-01-05 NOTE — Telephone Encounter (Signed)
Unable to refill per protocol, Rx request is too soon. Last refill 10/11/22 for 90 and 2 refills.  Requested Prescriptions  Pending Prescriptions Disp Refills   traZODone (DESYREL) 50 MG tablet [Pharmacy Med Name: TRAZODONE 50MG  TABLETS] 90 tablet 2    Sig: TAKE 1 TABLET(50 MG) BY MOUTH AT BEDTIME AS NEEDED FOR SLEEP     Psychiatry: Antidepressants - Serotonin Modulator Failed - 01/04/2023  3:12 AM      Failed - Valid encounter within last 6 months    Recent Outpatient Visits           7 months ago Annual physical exam   [place] [facility] Jacky Kindle, FNP   1 year ago Morbid obesity [facility])   [place] [facility] Jacky Kindle, FNP   1 year ago Annual physical exam   [facility] Jacky Kindle, FNP   1 year ago Chronic pain of both knees   [facility] Chrismon, Jodell Cipro, PA-C   2 years ago Difficulty sleeping   [facility] [place], Alessandra Bevels, [place]       Future Appointments             In 1 month Suzie Portela, Daryl Eastern, FNP [facility] [place] [facility], [facility]

## 2023-02-13 ENCOUNTER — Encounter: Payer: Self-pay | Admitting: Family Medicine

## 2023-02-13 ENCOUNTER — Ambulatory Visit (INDEPENDENT_AMBULATORY_CARE_PROVIDER_SITE_OTHER): Payer: Managed Care, Other (non HMO) | Admitting: Family Medicine

## 2023-02-13 DIAGNOSIS — E782 Mixed hyperlipidemia: Secondary | ICD-10-CM | POA: Diagnosis not present

## 2023-02-13 DIAGNOSIS — R7303 Prediabetes: Secondary | ICD-10-CM | POA: Diagnosis not present

## 2023-02-13 DIAGNOSIS — I10 Essential (primary) hypertension: Secondary | ICD-10-CM | POA: Diagnosis not present

## 2023-02-13 NOTE — Assessment & Plan Note (Signed)
Chronic, borderline Remains on norvasc 10 and losartan 100 mg Working on diet Limited exercise iso chronic knee pain

## 2023-02-13 NOTE — Assessment & Plan Note (Signed)
Chronic, previously elevated LDL The 10-year ASCVD risk score (Arnett DK, et al., 2019) is: 4.8% Repeat LP; pt notes non fasting state Currently on Crestor at 20 mg recommend diet low in saturated fat and regular exercise - 30 min at least 5 times per week

## 2023-02-13 NOTE — Assessment & Plan Note (Signed)
Chronic, unknown Last check >6 months ago Continue to recommend balanced, lower carb meals. Smaller meal size, adding snacks. Choosing water as drink of choice and increasing purposeful exercise. Repeat A1c Previously 5.7-5.9%

## 2023-02-13 NOTE — Assessment & Plan Note (Signed)
Chronic, down 13# Body mass index is 39.55 kg/m. In association with HTN, HLD, pre-diabetes Discussed importance of healthy weight management Discussed diet and exercise

## 2023-02-13 NOTE — Progress Notes (Signed)
Established patient visit   Patient: Rita Foster   DOB: 1961-06-04   62 y.o. Female  MRN: [id number] Visit Date: 02/13/2023  Today's healthcare provider: Jacky Kindle, FNP  Introduced to nurse practitioner role and practice setting.  All questions answered.  Discussed provider/patient relationship and expectations.  Subjective    HPI  Patient presents today to discuss her blood pressure, her weight, and requests for labs and form encounter for insurance.  Medications: Outpatient Medications Foster to Visit  Medication Sig   acetaminophen (TYLENOL 8 HOUR ARTHRITIS PAIN) 650 MG CR tablet Take 1,300 mg by mouth 2 (two) times daily.   acyclovir (ZOVIRAX) 400 MG tablet Take 1 tablet (400 mg total) by mouth every morning.   albuterol (VENTOLIN HFA) 108 (90 Base) MCG/ACT inhaler Inhale 2 puffs into the lungs every 6 (six) hours as needed for wheezing or shortness of breath.   amLODipine (NORVASC) 10 MG tablet Take 1 tablet (10 mg total) by mouth every morning. TAKE 1 TABLET(10 MG) BY MOUTH DAILY   Bacillus Coagulans-Inulin (ALIGN PREBIOTIC-PROBIOTIC) 5-1.25 MG-GM CHEW Chew 2 tablets by mouth daily. gummy   Biotin w/ Vitamins C & E (HAIR SKIN & NAILS GUMMIES PO) Take 2 tablets by mouth every evening.   Blood Pressure Monitoring (BLOOD PRESSURE MONITOR/WRIST) DEVI To check bp daily   celecoxib (CELEBREX) 200 MG capsule Take 1 capsule (200 mg total) by mouth 2 (two) times daily.   diclofenac Sodium (VOLTAREN) 1 % GEL Apply 2 g topically in the morning and at bedtime.   losartan (COZAAR) 100 MG tablet Take 1 tablet (100 mg total) by mouth every morning.   Misc Natural Products (OSTEO BI-FLEX ADV TRIPLE ST) TABS Take 2 tablets by mouth daily.   Multiple Vitamin tablet Take 1 tablet by mouth daily.    naproxen sodium (ALEVE) 220 MG tablet Take 440 mg by mouth 2 (two) times daily.   OVER THE COUNTER MEDICATION Take 1 tablet by mouth daily. Green-Lipped Mussel Oil   oxyCODONE (OXY  IR/ROXICODONE) 5 MG immediate release tablet Take 1 tablet (5 mg total) by mouth every 4 (four) hours as needed for moderate pain (pain score 4-6).   rosuvastatin (CRESTOR) 20 MG tablet Take 1 tablet (20 mg total) by mouth daily.   traMADol (ULTRAM) 50 MG tablet Take 1-2 tablets (50-100 mg total) by mouth every 4 (four) hours as needed for moderate pain.   traZODone (DESYREL) 50 MG tablet Take 1 tablet (50 mg total) by mouth at bedtime as needed for sleep.   tretinoin (RETIN-A) 0.025 % cream Apply topically at bedtime. For acne at face. Can use followed by moisturizer if irritating. (Patient taking differently: Apply 1 Application topically as needed. For acne at face. Can use followed by moisturizer if irritating.)   enoxaparin (LOVENOX) 40 MG/0.4ML injection Inject 0.4 mLs (40 mg total) into the skin daily for 14 days.   No facility-administered medications Foster to visit.    Review of Systems    Objective    BP 123/74 (BP Location: Right Arm, Patient Position: Sitting, Cuff Size: Large)   Pulse 60   Temp 98.2 F (36.8 C) (Temporal)   Resp 12   Ht 5\' 8"  (1.727 m)   Wt 260 lb 1.6 oz (118 kg)   SpO2 97%   BMI 39.55 kg/m   Physical Exam Vitals and nursing note reviewed.  Constitutional:      General: She is not in acute distress.    Appearance: Normal  appearance. She is obese. She is not ill-appearing, toxic-appearing or diaphoretic.  HENT:     Head: Normocephalic and atraumatic.  Cardiovascular:     Rate and Rhythm: Normal rate and regular rhythm.     Pulses: Normal pulses.     Heart sounds: Normal heart sounds. No murmur heard.    No friction rub. No gallop.  Pulmonary:     Effort: Pulmonary effort is normal. No respiratory distress.     Breath sounds: Normal breath sounds. No stridor. No wheezing, rhonchi or rales.  Chest:     Chest wall: No tenderness.  Musculoskeletal:        General: No swelling, tenderness, deformity or signs of injury. Normal range of motion.      Right lower leg: No edema.     Left lower leg: No edema.  Skin:    General: Skin is warm and dry.     Capillary Refill: Capillary refill takes less than 2 seconds.     Coloration: Skin is not jaundiced or pale.     Findings: No bruising, erythema, lesion or rash.  Neurological:     Mental Status: She is alert and oriented to person, place, and time. Mental status is at baseline.     Cranial Nerves: No cranial nerve deficit.     Sensory: No sensory deficit.     Motor: Weakness present.     Coordination: Coordination normal.     Comments: Use of cane iso chronic knee pain  Psychiatric:        Mood and Affect: Mood normal.        Behavior: Behavior normal.        Thought Content: Thought content normal.        Judgment: Judgment normal.     Comments: Endorses some depression following the loss of her father 4/24; denies workup at this time      No results found for any visits on 02/13/23.  Assessment & Plan     Problem List Items Addressed This Visit       Cardiovascular and Mediastinum   Primary hypertension    Chronic, borderline Remains on norvasc 10 and losartan 100 mg Working on diet Limited exercise iso chronic knee pain      Relevant Orders   CBC with Differential/Platelet   Comprehensive Metabolic Panel (CMET)   TSH + free T4     Other   Mixed hyperlipidemia    Chronic, previously elevated LDL The 10-year ASCVD risk score (Arnett DK, et al., 2019) is: 4.8% Repeat LP; pt notes non fasting state Currently on Crestor at 20 mg recommend diet low in saturated fat and regular exercise - 30 min at least 5 times per week       Relevant Orders   Lipid panel   Morbid obesity (HCC) - Primary    Chronic, down 13# Body mass index is 39.55 kg/m. In association with HTN, HLD, pre-diabetes Discussed importance of healthy weight management Discussed diet and exercise       Relevant Orders   CBC with Differential/Platelet   Comprehensive Metabolic Panel (CMET)    Lipid panel   Hemoglobin A1c   TSH + free T4   Prediabetes    Chronic, unknown Last check >6 months ago Continue to recommend balanced, lower carb meals. Smaller meal size, adding snacks. Choosing water as drink of choice and increasing purposeful exercise. Repeat A1c Previously 5.7-5.9%      Relevant Orders   Hemoglobin A1c  Return in about 4 months (around 06/11/2023) for annual examination.     Leilani Merl, FNP, have reviewed all documentation for this visit. The documentation on 02/13/23 for the exam, diagnosis, procedures, and orders are all accurate and complete.  Jacky Kindle, FNP  [facility] [phone] (phone) [phone] (fax)  [facility]

## 2023-03-14 ENCOUNTER — Ambulatory Visit (INDEPENDENT_AMBULATORY_CARE_PROVIDER_SITE_OTHER): Payer: Managed Care, Other (non HMO)

## 2023-03-14 ENCOUNTER — Ambulatory Visit
Admission: RE | Admit: 2023-03-14 | Discharge: 2023-03-14 | Disposition: A | Discharge status: Home / Self Care | Payer: Managed Care, Other (non HMO) | Source: Ambulatory Visit | Attending: Family Medicine | Admitting: Family Medicine

## 2023-03-14 DIAGNOSIS — Z1231 Encounter for screening mammogram for malignant neoplasm of breast: Secondary | ICD-10-CM | POA: Diagnosis present

## 2023-03-14 DIAGNOSIS — Z23 Encounter for immunization: Secondary | ICD-10-CM | POA: Diagnosis not present

## 2023-04-19 NOTE — Discharge Instructions (Signed)
Instructions after Total Knee Replacement   Mccoy Testa P. Angie Fava., M.D.    Dept. of Orthopaedics & Sports Medicine [facility] [address] [place], [place]  [address]  Phone: [phone]   Fax: [phone]       [email or web address]       DIET: Drink plenty of non-alcoholic fluids. Resume your normal diet. Include foods high in fiber.  ACTIVITY:  You may use crutches or a walker with weight-bearing as tolerated, unless instructed otherwise. You may be weaned off of the walker or crutches by your Physical Therapist.  Do NOT place pillows under the knee. Anything placed under the knee could limit your ability to straighten the knee.   Continue doing gentle exercises. Exercising will reduce the pain and swelling, increase motion, and prevent muscle weakness.   Please continue to use the TED compression stockings for 6 weeks. You may remove the stockings at night, but should reapply them in the morning. Do not drive or operate any equipment until instructed.  WOUND CARE:  Continue to use the PolarCare or ice packs periodically to reduce pain and swelling. You may bathe or shower after the staples are removed at the first office visit following surgery.  MEDICATIONS: You may resume your regular medications. Please take the pain medication as prescribed on the medication. Do not take pain medication on an empty stomach. You have been given a prescription for a blood thinner (Lovenox or Coumadin). Please take the medication as instructed. (NOTE: After completing a 2 week course of Lovenox, take one Enteric-coated aspirin once a day. This along with elevation will help reduce the possibility of phlebitis in your operated leg.) Do not drive or drink alcoholic beverages when taking pain medications.  CALL THE OFFICE FOR: Temperature above 101 degrees Excessive bleeding or drainage on the dressing. Excessive swelling, coldness, or paleness of the toes. Persistent nausea and  vomiting.  FOLLOW-UP:  You should have an appointment to return to the office in 10-14 days after surgery. Arrangements have been made for continuation of Physical Therapy (either home therapy or outpatient therapy).     [facility] Department Directory         [email or web address]       [email or web address]          Cardiology  Appointments: [place] - [phone]  Endocrinology  Appointments: [place] [phone] [place] - [phone]  Gastroenterology  Appointments: [place] [phone] [place] - [phone]        General Surgery   Appointments: [facility]  Internal Medicine/Family Medicine  Appointments: [facility] [place] - [phone] [place] - [phone]  Metabolic and Weigh Loss Surgery  Appointments: [facility]        Neurology  Appointments: [place] [phone] [place] - [phone]  Neurosurgery  Appointments: [place]  Obstetrics & Gynecology  Appointments: [place] [phone] [place] - [phone]        Pediatrics  Appointments: [place] [phone] [place] - [phone]  Physiatry  Appointments: [place] [phone]  Physical Therapy  Appointments: [place] - [phone]        Podiatry  Appointments: [place] [phone] [place] - [phone]  Pulmonology  Appointments: [place]  Rheumatology  Appointments: [place] [phone]        [place] Location: [facility]  [address] [place], [place]  [address]  [place] Location: [facility] [address] [place], [place]  [address]  [place] Location: [facility] [address]  [address] [place], [place]  [address]

## 2023-04-23 ENCOUNTER — Encounter
Admission: RE | Admit: 2023-04-23 | Discharge: 2023-04-23 | Disposition: A | Discharge status: Home / Self Care | Payer: Managed Care, Other (non HMO) | Source: Ambulatory Visit | Attending: Orthopedic Surgery | Admitting: Orthopedic Surgery

## 2023-04-23 ENCOUNTER — Other Ambulatory Visit: Payer: Self-pay

## 2023-04-23 VITALS — Ht 68.0 in | Wt 248.6 lb

## 2023-04-23 DIAGNOSIS — Z01810 Encounter for preprocedural cardiovascular examination: Secondary | ICD-10-CM | POA: Diagnosis present

## 2023-04-23 DIAGNOSIS — Z01818 Encounter for other preprocedural examination: Secondary | ICD-10-CM | POA: Diagnosis not present

## 2023-04-23 DIAGNOSIS — Z01812 Encounter for preprocedural laboratory examination: Secondary | ICD-10-CM

## 2023-04-23 DIAGNOSIS — M1712 Unilateral primary osteoarthritis, left knee: Secondary | ICD-10-CM | POA: Insufficient documentation

## 2023-04-23 LAB — CBC
HCT: 39.5 % (ref 36.0–46.0)
Hemoglobin: 13 g/dL (ref 12.0–15.0)
MCH: 29.9 pg (ref 26.0–34.0)
MCHC: 32.9 g/dL (ref 30.0–36.0)
MCV: 90.8 fL (ref 80.0–100.0)
Platelets: 325 10*3/uL (ref 150–400)
RBC: 4.35 MIL/uL (ref 3.87–5.11)
RDW: 14.5 % (ref 11.5–15.5)
WBC: 8.9 10*3/uL (ref 4.0–10.5)
nRBC: 0 % (ref 0.0–0.2)

## 2023-04-23 LAB — COMPREHENSIVE METABOLIC PANEL
ALT: 23 U/L (ref 0–44)
AST: 24 U/L (ref 15–41)
Albumin: 4.3 g/dL (ref 3.5–5.0)
Alkaline Phosphatase: 68 U/L (ref 38–126)
Anion gap: 9 (ref 5–15)
BUN: 19 mg/dL (ref 8–23)
CO2: 25 mmol/L (ref 22–32)
Calcium: 9.4 mg/dL (ref 8.9–10.3)
Chloride: 103 mmol/L (ref 98–111)
Creatinine, Ser: 0.6 mg/dL (ref 0.44–1.00)
GFR, Estimated: >60 mL/min (ref >60)
Glucose, Bld: 98 mg/dL (ref 70–99)
Potassium: 3.7 mmol/L (ref 3.5–5.1)
Sodium: 137 mmol/L (ref 135–145)
Total Bilirubin: 0.5 mg/dL (ref 0.3–1.2)
Total Protein: 7.3 g/dL (ref 6.5–8.1)

## 2023-04-23 LAB — URINALYSIS, ROUTINE W REFLEX MICROSCOPIC
Bilirubin Urine: NEGATIVE
Glucose, UA: NEGATIVE mg/dL
Hgb urine dipstick: NEGATIVE
Ketones, ur: NEGATIVE mg/dL
Leukocytes,Ua: NEGATIVE
Nitrite: NEGATIVE
Protein, ur: NEGATIVE mg/dL
Specific Gravity, Urine: 1.011 (ref 1.005–1.030)
pH: 5 (ref 5.0–8.0)

## 2023-04-23 LAB — SEDIMENTATION RATE: Sed Rate: 12 mm/h (ref 0–30)

## 2023-04-23 LAB — SURGICAL PCR SCREEN
MRSA, PCR: NEGATIVE
Staphylococcus aureus: NEGATIVE

## 2023-04-23 LAB — C-REACTIVE PROTEIN: CRP: 0.8 mg/dL (ref <1.0)

## 2023-04-23 NOTE — Patient Instructions (Addendum)
Your procedure is scheduled [id number] October 28  Report to the Registration Desk on the 1st floor of the [facility] To find out your arrival time, please call [phone] between 1PM - 3PM on: Friday October 25  If your arrival time is 6:00 am, do not arrive before that time as the [facility] entrance doors do not open until 6:00 am.  REMEMBER: Instructions that are not followed completely may result in serious medical risk, up to and including death; or upon the discretion of your surgeon and anesthesiologist your surgery may need to be rescheduled.  Do not eat food after midnight the night before surgery.  No gum chewing or hard candies.  You may however, drink CLEAR liquids up to 2 hours before you are scheduled to arrive for your surgery. Do not drink anything within 2 hours of your scheduled arrival time.  Clear liquids include: - water  - apple juice without pulp - gatorade (not RED colors) - black coffee or tea (Do NOT add milk or creamers to the coffee or tea) Do NOT drink anything that is not on this list.   In addition, your doctor has ordered for you to drink the provided:  Ensure Pre-Surgery Clear Carbohydrate Drink  Drinking this carbohydrate drink up to two hours before surgery helps to reduce insulin resistance and improve patient outcomes. Please complete drinking 2 hours before scheduled arrival time.  One week prior to surgery: Monday October 21 Stop Anti-inflammatories (NSAIDS) such as Advil, Aleve, Ibuprofen, Motrin, Naproxen, Naprosyn and Aspirin based products such as Excedrin, Goody's Powder, BC Powder. Stop ANY OVER THE COUNTER supplements until after surgery. Multiple Vitamin  You may however, continue to take Tylenol if needed for pain up until the day of surgery.  Continue taking all of your other prescription medications up until the day of surgery.  ON THE DAY OF SURGERY ONLY TAKE THESE MEDICATIONS WITH SIPS OF WATER:  acyclovir (ZOVIRAX)   amLODipine (NORVASC)  rosuvastatin (CRESTOR)   No Alcohol for 24 hours before or after surgery.  No Smoking including e-cigarettes for 24 hours before surgery.  No chewable tobacco products for at least 6 hours before surgery.  No nicotine patches on the day of surgery.  Do not use any "recreational" drugs for at least a week (preferably 2 weeks) before your surgery.  Please be advised that the combination of cocaine and anesthesia may have negative outcomes, up to and including death. If you test positive for cocaine, your surgery will be cancelled.  On the morning of surgery brush your teeth with toothpaste and water, you may rinse your mouth with mouthwash if you wish. Do not swallow any toothpaste or mouthwash.  Use CHG Soap as directed on instruction sheet.  Do not wear jewelry, make-up, hairpins, clips or nail polish.  For welded (permanent) jewelry: bracelets, anklets, waist bands, etc.  Please have this removed prior to surgery.  If it is not removed, there is a chance that hospital personnel will need to cut it off on the day of surgery.  Do not wear lotions, powders, or perfumes.   Do not shave body hair from the neck down 48 hours before surgery.  Contact lenses, hearing aids and dentures may not be worn into surgery.  Do not bring valuables to the hospital. [facility] is not responsible for any missing/lost belongings or valuables.   Notify your doctor if there is any change in your medical condition (cold, fever, infection).  Wear comfortable clothing (  specific to your surgery type) to the hospital.  After surgery, you can help prevent lung complications by doing breathing exercises.  Take deep breaths and cough every 1-2 hours. Your doctor may order a device called an Incentive Spirometer to help you take deep breaths. When coughing or sneezing, hold a pillow firmly against your incision with both hands. This is called "splinting." Doing this helps protect your  incision. It also decreases belly discomfort.  If you are being admitted to the hospital overnight, leave your suitcase in the car. After surgery it may be brought to your room.  In case of increased patient census, it may be necessary for you, the patient, to continue your postoperative care in the Same Day Surgery department.  If you are being discharged the day of surgery, you will not be allowed to drive home. You will need a responsible individual to drive you home and stay with you for 24 hours after surgery.   If you are taking public transportation, you will need to have a responsible individual with you.  Please call the Pre-admissions Testing Dept. at [phone] if you have any questions about these instructions.  Surgery Visitation Policy:  Patients having surgery or a procedure may have two visitors.  Children under the age of 47 must have an adult with them who is not the patient.  Inpatient Visitation:    Visiting hours are 7 a.m. to 8 p.m. Up to four visitors are allowed at one time in a patient room. The visitors may rotate out with other people during the day.  One visitor age 54 or older may stay with the patient overnight and must be in the room by 8 p.m.        Preparing the Skin Before Surgery     To help prevent the risk of infection at your surgical site, we are now providing you with rinse-free Sage 2% Chlorhexidine Gluconate (CHG) disposable wipes.  Chlorhexidine Gluconate (CHG) Soap  o An antiseptic cleaner that kills germs and bonds with the skin to continue killing germs even after washing  o Used for showering the night before surgery and morning of surgery  The night before surgery: Shower or bathe with warm water. Do not apply perfume, lotions, powders. Wait one hour after shower. Skin should be dry and cool. Open Sage wipe package - use 6 disposable cloths. Wipe body using one cloth for the right arm, one cloth for the left arm, one  cloth for the right leg, one cloth for the left leg, one cloth for the chest/abdomen area, and one cloth for the back. Do not use on open wounds or sores. Do not use on face or genitals (private parts). If you are breast feeding, do not use on breasts. 5. Do not rinse, allow to dry. 6. Skin may feel "tacky" for several minutes. 7. Dress in clean clothes. 8. Place clean sheets on your bed and do not sleep with pets.  REPEAT ABOVE ON THE MORNING OF SURGERY BEFORE ARRIVING TO THE HOSPITAL.      Pre-operative 5 CHG Bath Instructions   You can play a key role in reducing the risk of infection after surgery. Your skin needs to be as free of germs as possible. You can reduce the number of germs on your skin by washing with CHG (chlorhexidine gluconate) soap before surgery. CHG is an antiseptic soap that kills germs and continues to kill germs even after washing.   DO NOT use if  you have an allergy to chlorhexidine/CHG or antibacterial soaps. If your skin becomes reddened or irritated, stop using the CHG and notify one of our RNs at [phone].   Please shower with the CHG soap starting 4 days before surgery using the following schedule:     Please keep in mind the following:  DO NOT shave, including legs and underarms, starting the day of your first shower.   You may shave your face at any point before/day of surgery.  Place clean sheets on your bed the day you start using CHG soap. Use a clean washcloth (not used since being washed) for each shower. DO NOT sleep with pets once you start using the CHG.   CHG Shower Instructions:  If you choose to wash your hair and private area, wash first with your normal shampoo/soap.  After you use shampoo/soap, rinse your hair and body thoroughly to remove shampoo/soap residue.  Turn the water OFF and apply about 3 tablespoons (45 ml) of CHG soap to a CLEAN washcloth.  Apply CHG soap ONLY FROM YOUR NECK DOWN TO YOUR TOES (washing for 3-5 minutes)   DO NOT use CHG soap on face, private areas, open wounds, or sores.  Pay special attention to the area where your surgery is being performed.  If you are having back surgery, having someone wash your back for you may be helpful. Wait 2 minutes after CHG soap is applied, then you may rinse off the CHG soap.  Pat dry with a clean towel  Put on clean clothes/pajamas   If you choose to wear lotion, please use ONLY the CHG-compatible lotions on the back of this paper.     Additional instructions for the day of surgery: DO NOT APPLY any lotions, deodorants, cologne, or perfumes.   Put on clean/comfortable clothes.  Brush your teeth.  Ask your nurse before applying any prescription medications to the skin.      CHG Compatible Lotions   Aveeno Moisturizing lotion  Cetaphil Moisturizing Cream  Cetaphil Moisturizing Lotion  Clairol Herbal Essence Moisturizing Lotion, Dry Skin  Clairol Herbal Essence Moisturizing Lotion, Extra Dry Skin  Clairol Herbal Essence Moisturizing Lotion, Normal Skin  Curel Age Defying Therapeutic Moisturizing Lotion with Alpha Hydroxy  Curel Extreme Care Body Lotion  Curel Soothing Hands Moisturizing Hand Lotion  Curel Therapeutic Moisturizing Cream, Fragrance-Free  Curel Therapeutic Moisturizing Lotion, Fragrance-Free  Curel Therapeutic Moisturizing Lotion, Original Formula  Eucerin Daily Replenishing Lotion  Eucerin Dry Skin Therapy Plus Alpha Hydroxy Crme  Eucerin Dry Skin Therapy Plus Alpha Hydroxy Lotion  Eucerin Original Crme  Eucerin Original Lotion  Eucerin Plus Crme Eucerin Plus Lotion  Eucerin TriLipid Replenishing Lotion  Keri Anti-Bacterial Hand Lotion  Keri Deep Conditioning Original Lotion Dry Skin Formula Softly Scented  Keri Deep Conditioning Original Lotion, Fragrance Free Sensitive Skin Formula  Keri Lotion Fast Absorbing Fragrance Free Sensitive Skin Formula  Keri Lotion Fast Absorbing Softly Scented Dry Skin Formula  Keri Original  Lotion  Keri Skin Renewal Lotion Keri Silky Smooth Lotion  Keri Silky Smooth Sensitive Skin Lotion  Nivea Body Creamy Conditioning Oil  Nivea Body Extra Enriched Lotion  Nivea Body Original Lotion  Nivea Body Sheer Moisturizing Lotion Nivea Crme  Nivea Skin Firming Lotion  NutraDerm 30 Skin Lotion  NutraDerm Skin Lotion  NutraDerm Therapeutic Skin Cream  NutraDerm Therapeutic Skin Lotion  ProShield Protective Hand Cream  Provon moisturizing lotion  How to Use an Facilities manager  An incentive spirometer is a tool that measures  how well you are filling your lungs with each breath. Learning to take long, deep breaths using this tool can help you keep your lungs clear and active. This may help to reverse or lessen your chance of developing breathing (pulmonary) problems, especially infection. You may be asked to use a spirometer: After a surgery. If you have a lung problem or a history of smoking. After a long period of time when you have been unable to move or be active. If the spirometer includes an indicator to show the highest number that you have reached, your health care provider or respiratory therapist will help you set a goal. Keep a log of your progress as told by your health care provider. What are the risks? Breathing too quickly may cause dizziness or cause you to pass out. Take your time so you do not get dizzy or light-headed. If you are in pain, you may need to take pain medicine before doing incentive spirometry. It is harder to take a deep breath if you are having pain. How to use your incentive spirometer  Sit up on the edge of your bed or on a chair. Hold the incentive spirometer so that it is in an upright position. Before you use the spirometer, breathe out normally. Place the mouthpiece in your mouth. Make sure your lips are closed tightly around it. Breathe in slowly and as deeply as you can through your mouth, causing the piston or the ball to rise toward the  top of the chamber. Hold your breath for 3-5 seconds, or for as long as possible. If the spirometer includes a coach indicator, use this to guide you in breathing. Slow down your breathing if the indicator goes above the marked areas. Remove the mouthpiece from your mouth and breathe out normally. The piston or ball will return to the bottom of the chamber. Rest for a few seconds, then repeat the steps 10 or more times. Take your time and take a few normal breaths between deep breaths so that you do not get dizzy or light-headed. Do this every 1-2 hours when you are awake. If the spirometer includes a goal marker to show the highest number you have reached (best effort), use this as a goal to work toward during each repetition. After each set of 10 deep breaths, cough a few times. This will help to make sure that your lungs are clear. If you have an incision on your chest or abdomen from surgery, place a pillow or a rolled-up towel firmly against the incision when you cough. This can help to reduce pain while taking deep breaths and coughing. General tips When you are able to get out of bed: Walk around often. Continue to take deep breaths and cough in order to clear your lungs. Keep using the incentive spirometer until your health care provider says it is okay to stop using it. If you have been in the hospital, you may be told to keep using the spirometer at home. Contact a health care provider if: You are having difficulty using the spirometer. You have trouble using the spirometer as often as instructed. Your pain medicine is not giving enough relief for you to use the spirometer as told. You have a fever. Get help right away if: You develop shortness of breath. You develop a cough with bloody mucus from the lungs. You have fluid or blood coming from an incision site after you cough. Summary An incentive spirometer is a tool that can help  you learn to take long, deep breaths to keep your  lungs clear and active. You may be asked to use a spirometer after a surgery, if you have a lung problem or a history of smoking, or if you have been inactive for a long period of time. Use your incentive spirometer as instructed every 1-2 hours while you are awake. If you have an incision on your chest or abdomen, place a pillow or a rolled-up towel firmly against your incision when you cough. This will help to reduce pain. Get help right away if you have shortness of breath, you cough up bloody mucus, or blood comes from your incision when you cough. This information is not intended to replace advice given to you by your health care provider. Make sure you discuss any questions you have with your health care provider. Document Revised: 09/08/2019 Document Reviewed: 09/08/2019 Elsevier Patient Education  2023 [employer].

## 2023-04-27 NOTE — H&P (Signed)
ORTHOPAEDIC HISTORY & PHYSICAL Rita Foster, Rita Mings., Rita Foster - 04/05/2023 9:45 AM EDT Formatting of this note is different from the original. Images from the original note were not included. Chief Complaint: Chief Complaint Patient presents with Left knee degenerative arthrosis  Reason for Visit: The patient is a 62 y.o. female who presents today for reevaluation of her left knee. She has a long history of left knee pain. She localizes most of the pain along the medial aspect of the knee. She reports some swelling, no locking, and some giving way of the knee. The pain is aggravated by any weight bearing. The knee pain limits the patient's ability to ambulate long distances. The patient has not appreciated any significant improvement despite Tylenol, topical NSAIDs, intraarticular corticosteroid injections (including Zilretta), lidoderm patches, geniculate nerve block/ablation, viscosupplementation, activity modification, weight reduction, and ambulatory aids. She is using a cane for ambulation. The patient states that the knee pain has progressed to the point that it is significantly interfering with her activities of daily living.  Of note, she has a history of avascular necrosis of the left talus. She has been evaluated by Rita. Rita Foster at [facility]. She has been using an ASO ankle brace. She is considering surgical intervention but has been advised to defer until she has recovered from left total knee arthroplasty.  Medications: Current Outpatient Medications Medication Sig Dispense Refill acetaminophen (TYLENOL) 650 MG ER tablet Take 1,300 mg by mouth 2 (two) times daily as needed acyclovir (ZOVIRAX) 400 MG tablet Take 400 mg by mouth once daily amLODIPine (NORVASC) 10 MG tablet Take 1 tablet by mouth once daily diclofenac (VOLTAREN) 1 % topical gel Apply 2 g topically 3 (three) times daily folic acid/multivit-min/lutein (CENTRUM SILVER ORAL) Take 1 caplet by mouth once daily losartan (COZAAR)  100 MG tablet Take 100 mg by mouth once daily rosuvastatin (CRESTOR) 20 MG tablet Take 20 mg by mouth once daily traZODone (DESYREL) 50 MG tablet Take by mouth Take 0.5-2 tablets (25-100 mg total) by mouth at bedtime as needed for sleep  No current facility-administered medications for this visit.  Allergies: Allergies Allergen Reactions Corticosteroids (Glucocorticoids) Palpitations Years ago-heel injections Gabapentin Dizziness Meloxicam Abdominal Pain Penicillins Unknown IgE = 89 (WNL) on 06/30/2022  As a child-unsure of reaction  Past Medical History: Past Medical History: Diagnosis Date Asthma (HHS-HCC) 09/14/2006 AVN (avascular necrosis of bone) (CMS/HHS-HCC) 02/26/2022 Elevated BUN 06/01/2021 GERD (gastroesophageal reflux disease) Hypercholesteremia 04/02/2015 Hypertension Osteoarthritis  Past Surgical History: Past Surgical History: Procedure Laterality Date Tonsillectomy and Adnoidectomy 1968 Breat Biopsy 2002 Hernia Repair 02/21/2010 Cataract extraction Right 2016 Right total knee arthroplasty using computer-assisted navigation 07/12/2022 Rita Foster Breat Biopsy Left HERNIA REPAIR TONSILLECTOMY  Social History: Social History  Socioeconomic History Marital status: Divorced Number of children: 0 Years of education: 16 Highest education level: Bachelor's degree (e.g., BA, AB, BS) Occupational History 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 Tobacco Use Smoking status: Former Current packs/day: 0.00 Average packs/day: 0.5 packs/day for 10.0 years (5.0 ttl pk-yrs) Types: Cigarettes Start date: 07/02/1989 Quit date: 07/03/1999 Years since quitting: 23.7 Smokeless tobacco: Never Vaping Use Vaping status: Never Used Substance and Sexual Activity Alcohol use: Not Currently Drug use: Never Sexual activity: Not Currently Partners: Male  Social Determinants of Health  Food Insecurity: No Food Insecurity (07/12/2022) Received from  [facility], [place] Hunger Vital Sign Worried About Running Out of Food in the Last Year: Never true Ran Out of Food in the Last Year: Never true Transportation Needs: No Transportation  Needs (07/12/2022) Received from [facility], [facility] - Transportation Lack of Transportation (Medical): No Lack of Transportation (Non-Medical): No  Family History: Family History Problem Relation Name Age of Onset Breast cancer Mother Mother Crohn's disease Mother Mother Rheum arthritis Mother Mother Thyroid disease Mother Mother High blood pressure (Hypertension) Father Father Skin cancer Father Father Coronary Artery Disease (Blocked arteries around heart) Maternal Grandfather  Review of Systems: A comprehensive 14 point ROS was performed, reviewed, and the pertinent orthopaedic findings are documented in the HPI.  Exam BP 132/86  Ht 172.7 cm (5\' 8" )  Wt (!) 114.8 kg (253 lb)  BMI 38.47 kg/m  General: Well-developed, well-nourished female seen in no acute distress. Antalgic gait. Varus thrust to the left knee.  HEENT: Atraumatic, normocephalic. Pupils are equal and reactive to light. Extraocular motion is intact. Sclera are clear.  Neck: Supple, nontender, and with good ROM.  Lungs: Normal respiratory effort. No audible wheezing.  Cardiovascular: Peripheral pulses are palpable. Mild to moderate lower extremity edema. Homan`s test is negative.  Extremities: Good strength, stability, and range of motion of the upper extremities. Good range of motion of the hips. ASo ankle brace is in place to the left ankle with limited motion appreciated.  Left Knee: Soft tissue swelling: mild Effusion: mild Erythema: none Crepitance: mild Tenderness: medial Alignment: relative varus Mediolateral laxity: medial pseudolaxity Posterior sag: negative Patellar tracking: Good tracking without evidence of subluxation or tilt Atrophy: Generalized quadriceps atrophy. Quadriceps  tone was fair. Range of motion: 0/0/90 degrees  Neurologic: Awake, alert, and oriented. Sensory function is intact to pinprick and light touch. Motor strength is judged to be 5/5. Motor coordination is within normal limits. No apparent clonus. No tremor.  X-rays: I ordered and interpreted standing AP, lateral, and sunrise radiographs of the left knee that were obtained in the office today. There is significant narrowing of the medial cartilage space with bone-on-bone articulation and associated varus alignment. Pseudosubluxation is present. Osteophyte formation is noted. Subchondral sclerosis is noted. No evidence of fracture or dislocation.  Impression: Degenerative arthrosis of the left knee  Plan: Notes from [facility] [place], [place]) were reviewed. The findings were discussed in detail with the patient. The patient was given informational material on total knee replacement. Conservative treatment options were reviewed with the patient. We discussed the risks and benefits of surgical intervention. The usual perioperative course was also discussed in detail. The patient expressed understanding of the risks and benefits of surgical intervention and would like to proceed with plans for left total knee arthroplasty.  I spent a total of 45 minutes in both face-to-face and non-face-to-face activities, excluding procedures performed, for this visit on the date of this encounter.  MEDICAL CLEARANCE: Per anesthesiology. ACTIVITY: As tolerated. WORK STATUS: Anticipate out of work for 6-8 weeks following surgery. THERAPY: Preoperative physical therapy evaluation. MEDICATIONS: Requested Prescriptions  No prescriptions requested or ordered in this encounter  FOLLOW-UP: Return for postoperative follow-up.  Rita Foster P. Angie Fava., M.D.  This note was generated in part with voice recognition software and I apologize for any typographical errors that were not  detected and corrected.  Electronically signed by Rita Heritage., Rita Foster at 04/08/2023 3:22 PM EDT

## 2023-04-29 ENCOUNTER — Encounter: Payer: Self-pay | Admitting: Orthopedic Surgery

## 2023-04-30 ENCOUNTER — Other Ambulatory Visit: Payer: Self-pay

## 2023-04-30 ENCOUNTER — Encounter: Payer: Self-pay | Admitting: Orthopedic Surgery

## 2023-04-30 ENCOUNTER — Observation Stay
Admission: RE | Admit: 2023-04-30 | Discharge: 2023-05-01 | Disposition: A | Discharge status: Home / Self Care | Payer: Managed Care, Other (non HMO) | Attending: Orthopedic Surgery | Admitting: Orthopedic Surgery

## 2023-04-30 ENCOUNTER — Observation Stay: Payer: Managed Care, Other (non HMO)

## 2023-04-30 ENCOUNTER — Encounter
Admission: RE | Disposition: A | Discharge status: Home / Self Care | Payer: Self-pay | Source: Home / Self Care | Attending: Orthopedic Surgery

## 2023-04-30 ENCOUNTER — Ambulatory Visit: Payer: Managed Care, Other (non HMO)

## 2023-04-30 ENCOUNTER — Ambulatory Visit: Payer: Managed Care, Other (non HMO) | Admitting: Urgent Care

## 2023-04-30 DIAGNOSIS — M1712 Unilateral primary osteoarthritis, left knee: Secondary | ICD-10-CM | POA: Diagnosis present

## 2023-04-30 DIAGNOSIS — Z96651 Presence of right artificial knee joint: Secondary | ICD-10-CM | POA: Diagnosis not present

## 2023-04-30 DIAGNOSIS — Z96659 Presence of unspecified artificial knee joint: Secondary | ICD-10-CM

## 2023-04-30 DIAGNOSIS — Z87891 Personal history of nicotine dependence: Secondary | ICD-10-CM | POA: Diagnosis not present

## 2023-04-30 DIAGNOSIS — J45909 Unspecified asthma, uncomplicated: Secondary | ICD-10-CM | POA: Insufficient documentation

## 2023-04-30 DIAGNOSIS — I10 Essential (primary) hypertension: Secondary | ICD-10-CM | POA: Insufficient documentation

## 2023-04-30 DIAGNOSIS — Z96652 Presence of left artificial knee joint: Secondary | ICD-10-CM

## 2023-04-30 HISTORY — PX: KNEE ARTHROPLASTY: SHX992

## 2023-04-30 SURGERY — ARTHROPLASTY, KNEE, TOTAL, USING IMAGELESS COMPUTER-ASSISTED NAVIGATION
Anesthesia: Spinal | Site: Knee | Laterality: Left

## 2023-04-30 MED ORDER — PROPOFOL 10 MG/ML IV BOLUS
INTRAVENOUS | Status: AC
  Filled 2023-04-30: qty 20

## 2023-04-30 MED ORDER — GABAPENTIN 300 MG PO CAPS
ORAL_CAPSULE | ORAL | Status: AC
  Filled 2023-04-30: qty 1

## 2023-04-30 MED ORDER — CHLORHEXIDINE GLUCONATE 0.12 % MT SOLN
15.0000 mL | Freq: Once | OROMUCOSAL | Status: AC
  Administered 2023-04-30: 15 mL via OROMUCOSAL

## 2023-04-30 MED ORDER — OXYCODONE HCL 5 MG PO TABS
5.0000 mg | ORAL_TABLET | ORAL | Status: DC | PRN
  Administered 2023-05-01 (×2): 5 mg via ORAL

## 2023-04-30 MED ORDER — ACETAMINOPHEN 10 MG/ML IV SOLN
INTRAVENOUS | Status: AC
  Filled 2023-04-30: qty 100

## 2023-04-30 MED ORDER — BISACODYL 10 MG RE SUPP: 10.0000 mg | Freq: Every day | RECTAL | Status: DC | PRN

## 2023-04-30 MED ORDER — HYDROMORPHONE HCL 1 MG/ML IJ SOLN: 0.5000 mg | INTRAMUSCULAR | Status: DC | PRN

## 2023-04-30 MED ORDER — CELECOXIB 200 MG PO CAPS
ORAL_CAPSULE | ORAL | Status: AC
Start: 2023-04-30 — End: ?
  Filled 2023-04-30: qty 1

## 2023-04-30 MED ORDER — CEFAZOLIN SODIUM-DEXTROSE 2-4 GM/100ML-% IV SOLN
INTRAVENOUS | Status: AC
  Filled 2023-04-30: qty 100

## 2023-04-30 MED ORDER — FENTANYL CITRATE (PF) 100 MCG/2ML IJ SOLN
INTRAMUSCULAR | Status: DC | PRN
  Administered 2023-04-30 (×2): 50 ug via INTRAVENOUS

## 2023-04-30 MED ORDER — OXYCODONE HCL 5 MG PO TABS
10.0000 mg | ORAL_TABLET | ORAL | Status: DC | PRN
  Administered 2023-04-30 – 2023-05-01 (×2): 10 mg via ORAL

## 2023-04-30 MED ORDER — ROSUVASTATIN CALCIUM 20 MG PO TABS
20.0000 mg | ORAL_TABLET | Freq: Every day | ORAL | Status: DC
  Administered 2023-05-01: 20 mg via ORAL

## 2023-04-30 MED ORDER — TRAZODONE HCL 50 MG PO TABS
50.0000 mg | ORAL_TABLET | Freq: Every evening | ORAL | Status: DC | PRN
  Administered 2023-04-30: 50 mg via ORAL
  Filled 2023-04-30 (×2): qty 1

## 2023-04-30 MED ORDER — ENSURE PRE-SURGERY PO LIQD
296.0000 mL | Freq: Once | ORAL | Status: AC
  Administered 2023-04-30: 296 mL via ORAL
  Filled 2023-04-30: qty 296

## 2023-04-30 MED ORDER — DEXAMETHASONE SODIUM PHOSPHATE 10 MG/ML IJ SOLN
INTRAMUSCULAR | Status: AC
  Filled 2023-04-30: qty 1

## 2023-04-30 MED ORDER — OXYCODONE HCL 5 MG PO TABS
ORAL_TABLET | ORAL | Status: AC
  Filled 2023-04-30: qty 1

## 2023-04-30 MED ORDER — ASPIRIN 81 MG PO CHEW
81.0000 mg | CHEWABLE_TABLET | Freq: Two times a day (BID) | ORAL | Status: DC
  Administered 2023-05-01: 81 mg via ORAL

## 2023-04-30 MED ORDER — TRANEXAMIC ACID-NACL 1000-0.7 MG/100ML-% IV SOLN
INTRAVENOUS | Status: AC
  Filled 2023-04-30: qty 100

## 2023-04-30 MED ORDER — PHENYLEPHRINE 80 MCG/ML (10ML) SYRINGE FOR IV PUSH (FOR BLOOD PRESSURE SUPPORT)
PREFILLED_SYRINGE | INTRAVENOUS | Status: DC | PRN
  Administered 2023-04-30: 40 ug via INTRAVENOUS
  Administered 2023-04-30: 80 ug via INTRAVENOUS

## 2023-04-30 MED ORDER — MIDAZOLAM HCL 2 MG/2ML IJ SOLN
INTRAMUSCULAR | Status: AC
  Filled 2023-04-30: qty 2

## 2023-04-30 MED ORDER — BUPIVACAINE HCL (PF) 0.25 % IJ SOLN
INTRAMUSCULAR | Status: DC | PRN
  Administered 2023-04-30: 60 mL

## 2023-04-30 MED ORDER — SODIUM CHLORIDE 0.9 % IV SOLN
INTRAVENOUS | Status: DC | PRN
  Administered 2023-04-30: 60 mL

## 2023-04-30 MED ORDER — ALUM & MAG HYDROXIDE-SIMETH 200-200-20 MG/5ML PO SUSP: 30.0000 mL | ORAL | Status: DC | PRN

## 2023-04-30 MED ORDER — GABAPENTIN 300 MG PO CAPS
300.0000 mg | ORAL_CAPSULE | Freq: Once | ORAL | Status: AC
  Administered 2023-04-30: 300 mg via ORAL

## 2023-04-30 MED ORDER — SODIUM CHLORIDE 0.9 % IR SOLN
Status: DC | PRN
  Administered 2023-04-30: 3000 mL

## 2023-04-30 MED ORDER — PHENOL 1.4 % MT LIQD: 1.0000 | OROMUCOSAL | Status: DC | PRN

## 2023-04-30 MED ORDER — CHLORHEXIDINE GLUCONATE 4 % EX SOLN
60.0000 mL | Freq: Once | CUTANEOUS | Status: AC
  Administered 2023-04-30: 4 via TOPICAL

## 2023-04-30 MED ORDER — BUPIVACAINE HCL (PF) 0.5 % IJ SOLN
INTRAMUSCULAR | Status: DC | PRN
  Administered 2023-04-30: 3 mL

## 2023-04-30 MED ORDER — METOCLOPRAMIDE HCL 10 MG PO TABS
10.0000 mg | ORAL_TABLET | Freq: Three times a day (TID) | ORAL | Status: DC
  Administered 2023-04-30 – 2023-05-01 (×2): 10 mg via ORAL

## 2023-04-30 MED ORDER — ONDANSETRON HCL 4 MG/2ML IJ SOLN: 4.0000 mg | Freq: Four times a day (QID) | INTRAMUSCULAR | Status: DC | PRN

## 2023-04-30 MED ORDER — CHLORHEXIDINE GLUCONATE 0.12 % MT SOLN
OROMUCOSAL | Status: AC
  Filled 2023-04-30: qty 15

## 2023-04-30 MED ORDER — PROPOFOL 10 MG/ML IV BOLUS
INTRAVENOUS | Status: DC | PRN
  Administered 2023-04-30: 10 mg via INTRAVENOUS

## 2023-04-30 MED ORDER — MIDAZOLAM HCL 5 MG/5ML IJ SOLN
INTRAMUSCULAR | Status: DC | PRN
  Administered 2023-04-30: 2 mg via INTRAVENOUS

## 2023-04-30 MED ORDER — OXYCODONE HCL 5 MG/5ML PO SOLN: 5.0000 mg | Freq: Once | ORAL | Status: AC | PRN

## 2023-04-30 MED ORDER — TRAMADOL HCL 50 MG PO TABS: 50.0000 mg | ORAL_TABLET | ORAL | Status: DC | PRN

## 2023-04-30 MED ORDER — ACETAMINOPHEN 10 MG/ML IV SOLN
INTRAVENOUS | Status: DC | PRN
  Administered 2023-04-30: 1000 mg via INTRAVENOUS

## 2023-04-30 MED ORDER — CEFAZOLIN SODIUM-DEXTROSE 2-4 GM/100ML-% IV SOLN
2.0000 g | INTRAVENOUS | Status: AC
  Administered 2023-04-30: 2 g via INTRAVENOUS

## 2023-04-30 MED ORDER — CELECOXIB 200 MG PO CAPS
ORAL_CAPSULE | ORAL | Status: AC
  Filled 2023-04-30: qty 2

## 2023-04-30 MED ORDER — SENNOSIDES-DOCUSATE SODIUM 8.6-50 MG PO TABS
1.0000 | ORAL_TABLET | Freq: Two times a day (BID) | ORAL | Status: DC
  Administered 2023-04-30 – 2023-05-01 (×2): 1 via ORAL

## 2023-04-30 MED ORDER — EPHEDRINE SULFATE-NACL 50-0.9 MG/10ML-% IV SOSY
PREFILLED_SYRINGE | INTRAVENOUS | Status: DC | PRN
  Administered 2023-04-30: 10 mg via INTRAVENOUS
  Administered 2023-04-30: 5 mg via INTRAVENOUS

## 2023-04-30 MED ORDER — PROPOFOL 500 MG/50ML IV EMUL
INTRAVENOUS | Status: DC | PRN
  Administered 2023-04-30: 150 ug/kg/min via INTRAVENOUS
  Administered 2023-04-30: 110 ug/kg/min via INTRAVENOUS

## 2023-04-30 MED ORDER — METOCLOPRAMIDE HCL 10 MG PO TABS
ORAL_TABLET | ORAL | Status: AC
  Filled 2023-04-30: qty 1

## 2023-04-30 MED ORDER — ACETAMINOPHEN 10 MG/ML IV SOLN
1000.0000 mg | Freq: Four times a day (QID) | INTRAVENOUS | Status: DC
  Administered 2023-04-30 – 2023-05-01 (×2): 1000 mg via INTRAVENOUS

## 2023-04-30 MED ORDER — CELECOXIB 200 MG PO CAPS
400.0000 mg | ORAL_CAPSULE | Freq: Once | ORAL | Status: AC
  Administered 2023-04-30: 400 mg via ORAL

## 2023-04-30 MED ORDER — OXYCODONE HCL 5 MG PO TABS
5.0000 mg | ORAL_TABLET | Freq: Once | ORAL | Status: AC | PRN
  Administered 2023-04-30: 5 mg via ORAL

## 2023-04-30 MED ORDER — PROPOFOL 10 MG/ML IV BOLUS
INTRAVENOUS | Status: AC
  Filled 2023-04-30: qty 40

## 2023-04-30 MED ORDER — CEFAZOLIN SODIUM-DEXTROSE 2-4 GM/100ML-% IV SOLN
INTRAVENOUS | Status: AC
Start: 2023-04-30 — End: ?
  Filled 2023-04-30: qty 100

## 2023-04-30 MED ORDER — ONDANSETRON HCL 4 MG PO TABS: 4.0000 mg | ORAL_TABLET | Freq: Four times a day (QID) | ORAL | Status: DC | PRN

## 2023-04-30 MED ORDER — ACYCLOVIR 200 MG PO CAPS
400.0000 mg | ORAL_CAPSULE | ORAL | Status: DC
Start: 2023-05-01 — End: 2023-05-01
  Administered 2023-05-01: 400 mg via ORAL
  Filled 2023-04-30: qty 2

## 2023-04-30 MED ORDER — AMLODIPINE BESYLATE 5 MG PO TABS
10.0000 mg | ORAL_TABLET | ORAL | Status: DC
  Administered 2023-05-01: 10 mg via ORAL

## 2023-04-30 MED ORDER — FLEET ENEMA RE ENEM: 1.0000 | ENEMA | Freq: Once | RECTAL | Status: DC | PRN

## 2023-04-30 MED ORDER — BUPIVACAINE HCL (PF) 0.5 % IJ SOLN
INTRAMUSCULAR | Status: AC
Start: 2023-04-30 — End: ?
  Filled 2023-04-30: qty 10

## 2023-04-30 MED ORDER — FENTANYL CITRATE (PF) 100 MCG/2ML IJ SOLN: 25.0000 ug | INTRAMUSCULAR | Status: DC | PRN

## 2023-04-30 MED ORDER — SURGIRINSE WOUND IRRIGATION SYSTEM - OPTIME
TOPICAL | Status: DC | PRN
  Administered 2023-04-30: 450 mL via TOPICAL

## 2023-04-30 MED ORDER — FENTANYL CITRATE (PF) 100 MCG/2ML IJ SOLN
INTRAMUSCULAR | Status: AC
  Filled 2023-04-30: qty 2

## 2023-04-30 MED ORDER — TRANEXAMIC ACID-NACL 1000-0.7 MG/100ML-% IV SOLN
1000.0000 mg | Freq: Once | INTRAVENOUS | Status: AC
  Administered 2023-04-30: 1000 mg via INTRAVENOUS

## 2023-04-30 MED ORDER — LACTATED RINGERS IV SOLN: INTRAVENOUS | Status: DC

## 2023-04-30 MED ORDER — SODIUM CHLORIDE 0.9 % IV SOLN: INTRAVENOUS | Status: DC

## 2023-04-30 MED ORDER — TRANEXAMIC ACID-NACL 1000-0.7 MG/100ML-% IV SOLN
1000.0000 mg | INTRAVENOUS | Status: AC
  Administered 2023-04-30: 1000 mg via INTRAVENOUS

## 2023-04-30 MED ORDER — ROSUVASTATIN CALCIUM 20 MG PO TABS
ORAL_TABLET | ORAL | Status: AC
  Filled 2023-04-30: qty 1

## 2023-04-30 MED ORDER — CELECOXIB 200 MG PO CAPS
200.0000 mg | ORAL_CAPSULE | Freq: Two times a day (BID) | ORAL | Status: DC
  Administered 2023-04-30 – 2023-05-01 (×2): 200 mg via ORAL

## 2023-04-30 MED ORDER — MAGNESIUM HYDROXIDE 400 MG/5ML PO SUSP
ORAL | Status: AC
Start: 2023-04-30 — End: ?
  Filled 2023-04-30: qty 30

## 2023-04-30 MED ORDER — SENNOSIDES-DOCUSATE SODIUM 8.6-50 MG PO TABS
ORAL_TABLET | ORAL | Status: AC
  Filled 2023-04-30: qty 1

## 2023-04-30 MED ORDER — MENTHOL 3 MG MT LOZG: 1.0000 | LOZENGE | OROMUCOSAL | Status: DC | PRN

## 2023-04-30 MED ORDER — PANTOPRAZOLE SODIUM 40 MG PO TBEC
40.0000 mg | DELAYED_RELEASE_TABLET | Freq: Two times a day (BID) | ORAL | Status: DC
  Administered 2023-04-30 – 2023-05-01 (×2): 40 mg via ORAL

## 2023-04-30 MED ORDER — CEFAZOLIN SODIUM-DEXTROSE 2-4 GM/100ML-% IV SOLN
2.0000 g | Freq: Four times a day (QID) | INTRAVENOUS | Status: AC
  Administered 2023-04-30 – 2023-05-01 (×2): 2 g via INTRAVENOUS

## 2023-04-30 MED ORDER — PANTOPRAZOLE SODIUM 40 MG PO TBEC
DELAYED_RELEASE_TABLET | ORAL | Status: AC
Start: 2023-04-30 — End: ?
  Filled 2023-04-30: qty 1

## 2023-04-30 MED ORDER — ORAL CARE MOUTH RINSE: 15.0000 mL | Freq: Once | OROMUCOSAL | Status: AC

## 2023-04-30 MED ORDER — LOSARTAN POTASSIUM 50 MG PO TABS
100.0000 mg | ORAL_TABLET | ORAL | Status: DC
  Administered 2023-05-01: 100 mg via ORAL

## 2023-04-30 MED ORDER — DEXAMETHASONE SODIUM PHOSPHATE 10 MG/ML IJ SOLN
8.0000 mg | Freq: Once | INTRAMUSCULAR | Status: AC
  Administered 2023-04-30: 8 mg via INTRAVENOUS

## 2023-04-30 MED ORDER — PHENYLEPHRINE HCL-NACL 20-0.9 MG/250ML-% IV SOLN
INTRAVENOUS | Status: DC | PRN
Start: 2023-04-30 — End: 2023-04-30
  Administered 2023-04-30: 10 ug/min via INTRAVENOUS

## 2023-04-30 MED ORDER — FERROUS SULFATE 325 (65 FE) MG PO TABS
325.0000 mg | ORAL_TABLET | Freq: Two times a day (BID) | ORAL | Status: DC
  Administered 2023-05-01: 325 mg via ORAL

## 2023-04-30 MED ORDER — MAGNESIUM HYDROXIDE 400 MG/5ML PO SUSP
30.0000 mL | Freq: Every day | ORAL | Status: DC
  Administered 2023-04-30 – 2023-05-01 (×2): 30 mL via ORAL

## 2023-04-30 MED ORDER — DIPHENHYDRAMINE HCL 12.5 MG/5ML PO ELIX: 12.5000 mg | ORAL_SOLUTION | ORAL | Status: DC | PRN

## 2023-04-30 MED ORDER — ACETAMINOPHEN 325 MG PO TABS: 325.0000 mg | ORAL_TABLET | Freq: Four times a day (QID) | ORAL | Status: DC | PRN

## 2023-04-30 SURGICAL SUPPLY — 77 items
ATTUNE MED DOME PAT 38 KNEE (Knees) IMPLANT
ATTUNE PS FEM LT SZ 6 CEM KNEE (Femur) IMPLANT
ATTUNE PSRP INSR SZ6 10 KNEE (Insert) IMPLANT
BASE TIBIA ATTUNE KNEE SYS SZ6 (Knees) IMPLANT
BATTERY INSTRU NAVIGATION (MISCELLANEOUS) ×4 IMPLANT
BIT DRILL QUICK REL 1/8 2PK SL (BIT) ×1 IMPLANT
BLADE SAW 70X12.5 (BLADE) ×1 IMPLANT
BLADE SAW 90X13X1.19 OSCILLAT (BLADE) ×1 IMPLANT
BLADE SAW 90X25X1.19 OSCILLAT (BLADE) ×1 IMPLANT
BONE CEMENT GENTAMICIN (Cement) ×2 IMPLANT
BRUSH SCRUB EZ PLAIN DRY (MISCELLANEOUS) ×1 IMPLANT
BSPLAT TIB 6 CMNT ROT PLAT STR (Knees) ×1 IMPLANT
BTRY SRG DRVR LF (MISCELLANEOUS) ×4
CEMENT BONE GENTAMICIN 40 (Cement) IMPLANT
COOLER POLAR GLACIER W/PUMP (MISCELLANEOUS) ×1 IMPLANT
CUFF TOURN SGL QUICK 24 (TOURNIQUET CUFF)
CUFF TOURN SGL QUICK 30 (TOURNIQUET CUFF) ×1
CUFF TRNQT CYL 24X4X16.5-23 (TOURNIQUET CUFF) IMPLANT
CUFF TRNQT CYL 30X4X21-28X (TOURNIQUET CUFF) IMPLANT
DRAPE INCISE IOBAN 66X45 STRL (DRAPES) IMPLANT
DRAPE SHEET LG 3/4 BI-LAMINATE (DRAPES) ×1 IMPLANT
DRSG AQUACEL AG ADV 3.5X 4 (GAUZE/BANDAGES/DRESSINGS) IMPLANT
DRSG AQUACEL AG ADV 3.5X14 (GAUZE/BANDAGES/DRESSINGS) ×1 IMPLANT
DRSG MEPILEX SACRM 8.7X9.8 (GAUZE/BANDAGES/DRESSINGS) ×1 IMPLANT
DRSG TEGADERM 4X4.75 (GAUZE/BANDAGES/DRESSINGS) ×1 IMPLANT
DRSG XEROFORM 1X8 (GAUZE/BANDAGES/DRESSINGS) IMPLANT
DURAPREP 26ML APPLICATOR (WOUND CARE) ×2 IMPLANT
ELECT CAUTERY BLADE 6.4 (BLADE) ×1 IMPLANT
ELECT REM PT RETURN 9FT ADLT (ELECTROSURGICAL) ×1
ELECTRODE REM PT RTRN 9FT ADLT (ELECTROSURGICAL) ×1 IMPLANT
EVACUATOR 1/8 PVC DRAIN (DRAIN) ×1 IMPLANT
EX-PIN ORTHOLOCK NAV 4X150 (PIN) ×2 IMPLANT
GAUZE XEROFORM 1X8 LF (GAUZE/BANDAGES/DRESSINGS) ×1 IMPLANT
GLOVE BIOGEL M STRL SZ7.5 (GLOVE) ×4 IMPLANT
GLOVE SRG 8 PF TXTR STRL LF DI (GLOVE) ×2 IMPLANT
GLOVE SURG UNDER POLY LF SZ8 (GLOVE) ×2
GOWN STRL REUS W/ TWL LRG LVL3 (GOWN DISPOSABLE) ×1 IMPLANT
GOWN STRL REUS W/ TWL XL LVL3 (GOWN DISPOSABLE) ×1 IMPLANT
GOWN STRL REUS W/TWL LRG LVL3 (GOWN DISPOSABLE) ×1
GOWN STRL REUS W/TWL XL LVL3 (GOWN DISPOSABLE) ×1
GOWN TOGA ZIPPER T7+ PEEL AWAY (MISCELLANEOUS) ×1 IMPLANT
HANDLE YANKAUER SUCT OPEN TIP (MISCELLANEOUS) ×1 IMPLANT
HOLDER FOLEY CATH W/STRAP (MISCELLANEOUS) ×1 IMPLANT
HOOD PEEL AWAY T7 (MISCELLANEOUS) ×1 IMPLANT
IV NS IRRIG 3000ML ARTHROMATIC (IV SOLUTION) ×1 IMPLANT
KIT TURNOVER KIT A (KITS) ×1 IMPLANT
KNIFE SCULPS 14X20 (INSTRUMENTS) ×1 IMPLANT
MANIFOLD NEPTUNE II (INSTRUMENTS) ×2 IMPLANT
NDL SPNL 20GX3.5 QUINCKE YW (NEEDLE) ×2 IMPLANT
NEEDLE SPNL 20GX3.5 QUINCKE YW (NEEDLE) ×2
PACK TOTAL KNEE (MISCELLANEOUS) ×1 IMPLANT
PAD ABD DERMACEA PRESS 5X9 (GAUZE/BANDAGES/DRESSINGS) ×2 IMPLANT
PAD ARMBOARD 7.5X6 YLW CONV (MISCELLANEOUS) ×3 IMPLANT
PAD WRAPON POLOR MULTI XL (MISCELLANEOUS) IMPLANT
PENCIL SMOKE EVACUATOR COATED (MISCELLANEOUS) ×1 IMPLANT
PIN DRILL FIX HALF THREAD (BIT) ×2 IMPLANT
PIN FIXATION 1/8DIA X 3INL (PIN) ×1 IMPLANT
PULSAVAC PLUS IRRIG FAN TIP (DISPOSABLE) ×1
SOLUTION IRRIG SURGIPHOR (IV SOLUTION) ×1 IMPLANT
SPONGE DRAIN TRACH 4X4 STRL 2S (GAUZE/BANDAGES/DRESSINGS) ×1 IMPLANT
STAPLER SKIN PROX 35W (STAPLE) ×1 IMPLANT
STOCKINETTE IMPERV 14X48 (MISCELLANEOUS) ×1 IMPLANT
STRAP TIBIA SHORT (MISCELLANEOUS) ×1 IMPLANT
SUCTION TUBE FRAZIER 10FR DISP (SUCTIONS) ×1 IMPLANT
SUT VIC AB 0 CT1 36 (SUTURE) ×1 IMPLANT
SUT VIC AB 1 CT1 36 (SUTURE) ×2 IMPLANT
SUT VIC AB 2-0 CT2 27 (SUTURE) ×1 IMPLANT
SYR 30ML LL (SYRINGE) ×2 IMPLANT
TIBIA ATTUNE KNEE SYS BASE SZ6 (Knees) ×1 IMPLANT
TIP FAN IRRIG PULSAVAC PLUS (DISPOSABLE) ×1 IMPLANT
TOWEL OR 17X26 4PK STRL BLUE (TOWEL DISPOSABLE) IMPLANT
TOWER CARTRIDGE SMART MIX (DISPOSABLE) ×1 IMPLANT
TRAP FLUID SMOKE EVACUATOR (MISCELLANEOUS) ×1 IMPLANT
TRAY FOLEY MTR SLVR 16FR STAT (SET/KITS/TRAYS/PACK) ×1 IMPLANT
TUBING CONNECTING 10 (TUBING) ×2 IMPLANT
WATER STERILE IRR 1000ML POUR (IV SOLUTION) ×1 IMPLANT
WRAP-ON POLOR PAD MULTI XL (MISCELLANEOUS) ×1

## 2023-04-30 NOTE — Anesthesia Procedure Notes (Signed)
Date/Time: 04/30/2023 11:15 AM  Performed by: Lysbeth Penner, CRNAPre-anesthesia Checklist: Patient identified, Emergency Drugs available, Suction available, Patient being monitored and Timeout performed Patient Re-evaluated:Patient Re-evaluated prior to induction Oxygen Delivery Method: Simple face mask Preoxygenation: Pre-oxygenation with 100% oxygen Induction Type: IV induction

## 2023-04-30 NOTE — Anesthesia Preprocedure Evaluation (Signed)
Anesthesia Evaluation  Patient identified by MRN, date of birth, ID band Patient awake    Reviewed: Allergy & Precautions, NPO status , Patient's Chart, lab work & pertinent test results  Airway Mallampati: II  TM Distance: >3 FB Neck ROM: full    Dental no notable dental hx. (+) Dental Advidsory Given   Pulmonary asthma , former smoker   Pulmonary exam normal        Cardiovascular Exercise Tolerance: Poor hypertension, Normal cardiovascular exam     Neuro/Psych negative neurological ROS  negative psych ROS   GI/Hepatic negative GI ROS, Neg liver ROS,,,  Endo/Other    Morbid obesityPre-diabetes  Renal/GU      Musculoskeletal   Abdominal   Peds  Hematology negative hematology ROS (+)   Anesthesia Other Findings Reports use of walker chronically with no neurological complaints.  Past Medical History: No date: Asthma 05/10/2022: Basal cell carcinoma     Comment:  Left nasal sidewall. Nodular type. Mohs pending. No date: Hyperlipidemia No date: Hypertension No date: Pre-diabetes  Past Surgical History: 10+ years ago: BREAST BIOPSY; Left     Comment:  Negative core No date: BREAST EXCISIONAL BIOPSY; Left     Comment:  2002 07/2014: CATARACT EXTRACTION; Right 2016: CATARACT EXTRACTION; Left 02/21/2010: HERNIA REPAIR 1968: TONSILLECTOMY AND ADENOIDECTOMY  BMI    Body Mass Index: 41.65 kg/m      Reproductive/Obstetrics negative OB ROS                             Anesthesia Physical Anesthesia Plan  ASA: 2  Anesthesia Plan: General/Spinal   Post-op Pain Management:    Induction:   PONV Risk Score and Plan: 2 and Ondansetron, Propofol infusion, TIVA and Midazolam  Airway Management Planned: Natural Airway and Nasal Cannula  Additional Equipment:   Intra-op Plan:   Post-operative Plan:   Informed Consent: I have reviewed the patients History and Physical, chart, labs  and discussed the procedure including the risks, benefits and alternatives for the proposed anesthesia with the patient or authorized representative who has indicated his/her understanding and acceptance.     Dental Advisory Given  Plan Discussed with: Anesthesiologist, CRNA and Surgeon  Anesthesia Plan Comments: (Patient reports no bleeding problems and no anticoagulant use.  Plan for spinal with backup GA  Patient consented for risks of anesthesia including but not limited to:  - adverse reactions to medications - damage to eyes, teeth, lips or other oral mucosa - nerve damage due to positioning  - risk of bleeding, infection and or nerve damage from spinal that could lead to paralysis - risk of headache or failed spinal - damage to teeth, lips or other oral mucosa - sore throat or hoarseness - damage to heart, brain, nerves, lungs, other parts of body or loss of life  Patient voiced understanding and assent.)       Anesthesia Quick Evaluation

## 2023-04-30 NOTE — Interval H&P Note (Signed)
History and Physical Interval Note:  04/30/2023 10:36 AM  Rita Foster  has presented today for surgery, with the diagnosis of PRIMARY OSTEOARTHRITIS OF LEFT KNEE..  The various methods of treatment have been discussed with the patient and family. After consideration of risks, benefits and other options for treatment, the patient has consented to  Procedure(s): COMPUTER ASSISTED TOTAL KNEE ARTHROPLASTY (Left) as a surgical intervention.  The patient's history has been reviewed, patient examined, no change in status, stable for surgery.  I have reviewed the patient's chart and labs.  Questions were answered to the patient's satisfaction.     Gloria Lambertson P Persais Ethridge

## 2023-04-30 NOTE — Transfer of Care (Signed)
Immediate Anesthesia Transfer of Care Note  Patient: Rita Foster  Procedure(s) Performed: COMPUTER ASSISTED TOTAL KNEE ARTHROPLASTY (Left: Knee)  Patient Location: PACU  Anesthesia Type:General and Spinal  Level of Consciousness: awake and drowsy  Airway & Oxygen Therapy: Patient Spontanous Breathing  Post-op Assessment: Report given to RN and Post -op Vital signs reviewed and stable  Post vital signs: Reviewed and stable  Last Vitals:  Vitals Value Taken Time  BP    Temp    Pulse    Resp    SpO2      Last Pain:  Vitals:   04/30/23 1001  TempSrc: Temporal  PainSc: 0-No pain         Complications: There were no known notable events for this encounter.

## 2023-04-30 NOTE — Progress Notes (Signed)
Patient is not able to walk the distance required to go the bathroom, or he/she is unable to safely negotiate stairs required to access the bathroom.  A 3in1 BSC will alleviate this problem   Liliann File P. Brighton Pilley, Jr. M.D.  

## 2023-04-30 NOTE — Op Note (Signed)
OPERATIVE NOTE  DATE OF SURGERY:  04/30/2023  PATIENT NAME:  RALONDA SMEDLEY   DOB: 03/10/1961  MRN: [id number]  PRE-OPERATIVE DIAGNOSIS: Degenerative arthrosis of the left knee, primary  POST-OPERATIVE DIAGNOSIS:  Same  PROCEDURE:  Left total knee arthroplasty using computer-assisted navigation  SURGEON:  Jena Gauss. M.D.  ASSISTANT:  Gean Birchwood, PA-C (present and scrubbed throughout the case, critical for assistance with exposure, retraction, instrumentation, and closure)  ANESTHESIA: spinal  ESTIMATED BLOOD LOSS: 50 mL  FLUIDS REPLACED: 800 mL of crystalloid  TOURNIQUET TIME: 89 minutes  DRAINS: 2 medium Hemovac drains  SOFT TISSUE RELEASES: Anterior cruciate ligament, posterior cruciate ligament, deep medial collateral ligament, patellofemoral ligament  IMPLANTS UTILIZED: DePuy Attune size 6 posterior stabilized femoral component (cemented), size 6 rotating platform tibial component (cemented), 38 mm medialized dome patella (cemented), and a 10 mm stabilized rotating platform polyethylene insert.  INDICATIONS FOR SURGERY: MELISSIE KEATLEY is a 62 y.o. year old female with a long history of progressive knee pain. X-rays demonstrated severe degenerative changes in tricompartmental fashion. The patient had not seen any significant improvement despite conservative nonsurgical intervention. After discussion of the risks and benefits of surgical intervention, the patient expressed understanding of the risks benefits and agree with plans for total knee arthroplasty.   The risks, benefits, and alternatives were discussed at length including but not limited to the risks of infection, bleeding, nerve injury, stiffness, blood clots, the need for revision surgery, cardiopulmonary complications, among others, and they were willing to proceed.  PROCEDURE IN DETAIL: The patient was brought into the operating room and, after adequate spinal anesthesia was achieved, a tourniquet  was placed on the patient's upper thigh. The patient's knee and leg were cleaned and prepped with alcohol and DuraPrep and draped in the usual sterile fashion. A "timeout" was performed as per usual protocol. The lower extremity was exsanguinated using an Esmarch, and the tourniquet was inflated to 300 mmHg. An anterior longitudinal incision was made followed by a standard mid vastus approach. The deep fibers of the medial collateral ligament were elevated in a subperiosteal fashion off of the medial flare of the tibia so as to maintain a continuous soft tissue sleeve. The patella was subluxed laterally and the patellofemoral ligament was incised. Inspection of the knee demonstrated severe degenerative changes with full-thickness loss of articular cartilage. Osteophytes were debrided using a rongeur. Anterior and posterior cruciate ligaments were excised. Two 4.0 mm Schanz pins were inserted in the femur and into the tibia for attachment of the array of trackers used for computer-assisted navigation. Hip center was identified using a circumduction technique. Distal landmarks were mapped using the computer. The distal femur and proximal tibia were mapped using the computer. The distal femoral cutting guide was positioned using computer-assisted navigation so as to achieve a 5 distal valgus cut. The femur was sized and it was felt that a size 6 femoral component was appropriate. A size 6 femoral cutting guide was positioned and the anterior cut was performed and verified using the computer. This was followed by completion of the posterior and chamfer cuts. Femoral cutting guide for the central box was then positioned in the center box cut was performed.  Attention was then directed to the proximal tibia. Medial and lateral menisci were excised. The extramedullary tibial cutting guide was positioned using computer-assisted navigation so as to achieve a 0 varus-valgus alignment and 3 posterior slope. The cut was  performed and verified using the computer. The proximal tibia  was sized and it was felt that a size 6 tibial tray was appropriate. Tibial and femoral trials were inserted followed by insertion of a 10 mm polyethylene insert. This allowed for excellent mediolateral soft tissue balancing both in flexion and in full extension. Finally, the patella was cut and prepared so as to accommodate a 38 mm medialized dome patella. A patella trial was placed and the knee was placed through a range of motion with excellent patellar tracking appreciated. The femoral trial was removed after debridement of posterior osteophytes. The central post-hole for the tibial component was reamed followed by insertion of a keel punch. Tibial trials were then removed. Cut surfaces of bone were irrigated with copious amounts of normal saline using pulsatile lavage and then suctioned dry. Polymethylmethacrylate cement with gentamicin was prepared in the usual fashion using a vacuum mixer. Cement was applied to the cut surface of the proximal tibia as well as along the undersurface of a size 6 rotating platform tibial component. Tibial component was positioned and impacted into place. Excess cement was removed using Personal assistant. Cement was then applied to the cut surfaces of the femur as well as along the posterior flanges of the size 6 femoral component. The femoral component was positioned and impacted into place. Excess cement was removed using Personal assistant. A 10 mm polyethylene trial was inserted and the knee was brought into full extension with steady axial compression applied. Finally, cement was applied to the backside of a 38 mm medialized dome patella and the patellar component was positioned and patellar clamp applied. Excess cement was removed using Personal assistant. After adequate curing of the cement, the tourniquet was deflated after a total tourniquet time of 89 minutes. Hemostasis was achieved using electrocautery. The knee  was irrigated with copious amounts of normal saline using pulsatile lavage followed by 450 ml of Surgiphor and then suctioned dry. 20 mL of 1.3% Exparel and 60 mL of 0.25% Marcaine in 40 mL of normal saline was injected along the posterior capsule, medial and lateral gutters, and along the arthrotomy site. A 10 mm stabilized rotating platform polyethylene insert was inserted and the knee was placed through a range of motion with excellent mediolateral soft tissue balancing appreciated and excellent patellar tracking noted. 2 medium drains were placed in the wound bed and brought out through separate stab incisions. The medial parapatellar portion of the incision was reapproximated using interrupted sutures of #1 Vicryl. Subcutaneous tissue was approximated in layers using first #0 Vicryl followed #2-0 Vicryl. The skin was approximated with skin staples. A sterile dressing was applied.  The patient tolerated the procedure well and was transported to the recovery room in stable condition.    Jaleisa Brose P. Angie Fava., M.D.

## 2023-05-01 ENCOUNTER — Encounter: Payer: Self-pay | Admitting: Orthopedic Surgery

## 2023-05-01 DIAGNOSIS — M1712 Unilateral primary osteoarthritis, left knee: Secondary | ICD-10-CM | POA: Diagnosis not present

## 2023-05-01 MED ORDER — PANTOPRAZOLE SODIUM 40 MG PO TBEC
DELAYED_RELEASE_TABLET | ORAL | Status: AC
  Filled 2023-05-01: qty 1

## 2023-05-01 MED ORDER — CELECOXIB 200 MG PO CAPS: 200.0000 mg | ORAL_CAPSULE | Freq: Two times a day (BID) | ORAL | 1 refills | Status: DC

## 2023-05-01 MED ORDER — CELECOXIB 200 MG PO CAPS
ORAL_CAPSULE | ORAL | Status: AC
  Filled 2023-05-01: qty 1

## 2023-05-01 MED ORDER — OXYCODONE HCL 5 MG PO TABS
ORAL_TABLET | ORAL | Status: AC
  Filled 2023-05-01: qty 1

## 2023-05-01 MED ORDER — OXYCODONE HCL 5 MG PO TABS
ORAL_TABLET | ORAL | Status: AC
  Filled 2023-05-01: qty 2

## 2023-05-01 MED ORDER — LOSARTAN POTASSIUM 50 MG PO TABS
ORAL_TABLET | ORAL | Status: AC
  Filled 2023-05-01: qty 1

## 2023-05-01 MED ORDER — ROSUVASTATIN CALCIUM 20 MG PO TABS
ORAL_TABLET | ORAL | Status: AC
  Filled 2023-05-01: qty 1

## 2023-05-01 MED ORDER — ASPIRIN 81 MG PO TBEC: 81.0000 mg | DELAYED_RELEASE_TABLET | Freq: Two times a day (BID) | ORAL | Status: DC

## 2023-05-01 MED ORDER — TRAMADOL HCL 50 MG PO TABS: 50.0000 mg | ORAL_TABLET | ORAL | 0 refills | Status: DC | PRN

## 2023-05-01 MED ORDER — FERROUS SULFATE 325 (65 FE) MG PO TABS
ORAL_TABLET | ORAL | Status: AC
  Filled 2023-05-01: qty 1

## 2023-05-01 MED ORDER — LOSARTAN POTASSIUM 50 MG PO TABS
ORAL_TABLET | ORAL | Status: AC
Start: 2023-05-01 — End: ?
  Filled 2023-05-01: qty 1

## 2023-05-01 MED ORDER — METOCLOPRAMIDE HCL 10 MG PO TABS
ORAL_TABLET | ORAL | Status: AC
Start: 2023-05-01 — End: ?
  Filled 2023-05-01: qty 1

## 2023-05-01 MED ORDER — ORAL CARE MOUTH RINSE: 15.0000 mL | OROMUCOSAL | Status: DC | PRN

## 2023-05-01 MED ORDER — OXYCODONE HCL 5 MG PO TABS: 5.0000 mg | ORAL_TABLET | ORAL | 0 refills | Status: DC | PRN

## 2023-05-01 MED ORDER — SENNOSIDES-DOCUSATE SODIUM 8.6-50 MG PO TABS
ORAL_TABLET | ORAL | Status: AC
Start: 2023-05-01 — End: ?
  Filled 2023-05-01: qty 1

## 2023-05-01 MED ORDER — ASPIRIN 81 MG PO CHEW
CHEWABLE_TABLET | ORAL | Status: AC
  Filled 2023-05-01: qty 1

## 2023-05-01 MED ORDER — MAGNESIUM HYDROXIDE 400 MG/5ML PO SUSP
ORAL | Status: AC
  Filled 2023-05-01: qty 30

## 2023-05-01 MED ORDER — AMLODIPINE BESYLATE 5 MG PO TABS
ORAL_TABLET | ORAL | Status: AC
  Filled 2023-05-01: qty 2

## 2023-05-01 MED ORDER — CEFAZOLIN SODIUM-DEXTROSE 2-4 GM/100ML-% IV SOLN
INTRAVENOUS | Status: AC
  Filled 2023-05-01: qty 100

## 2023-05-01 MED ORDER — ACETAMINOPHEN 10 MG/ML IV SOLN
INTRAVENOUS | Status: AC
  Filled 2023-05-01: qty 100

## 2023-05-01 NOTE — Evaluation (Signed)
Physical Therapy Evaluation Patient Details Name: Rita Foster MRN: [id number] DOB: 1961-05-09 Today's Date: 05/01/2023  History of Present Illness  Patient is a 62 year old female with degenerative arthrosis of the left knee s/p left total knee arthroplasty. History of avascular necrosis of the left talus and prior right total knee arthroplasty earlier this year  Clinical Impression  Patient is agreeable to PT evaluation. She reports she has been using a cane for ambulation but still has DME in place from prior knee replacement surgery. She reports she has a ramped entrance to her home and slept in her recliner chair for 4 weeks following prior knee surgery.  Today, patient required no physical assistance for transfers or ambulation. Gait training initiated with cues for improved gait kinematics using rolling walker. Patient ambulated 48ft and 9ft, which is farther than she ambulated with therapy in the hospital following previous TKA. She did require intermittent assistance for LLE support to get back to bed, however she reports she will be sleeping in her recliner chair initially after going home as she did before. Mobility is adequate for discharge home with family support. Recommend continued PT after this hospital stay.     If plan is discharge home, recommend the following: Assist for transportation;Help with stairs or ramp for entrance   Can travel by private vehicle        Equipment Recommendations None recommended by PT  Recommendations for Other Services       Functional Status Assessment Patient has had a recent decline in their functional status and demonstrates the ability to make significant improvements in function in a reasonable and predictable amount of time.     Precautions / Restrictions Precautions Precautions: Fall;Knee Precaution Booklet Issued: Yes (comment) Restrictions Weight Bearing Restrictions: Yes LLE Weight Bearing: Weight bearing as tolerated       Mobility  Bed Mobility Overal bed mobility: Needs Assistance Bed Mobility: Sit to Supine       Sit to supine: Contact guard assist, Min assist, Used rails   General bed mobility comments: for LLE support. verbal cues for technique    Transfers Overall transfer level: Needs assistance Equipment used: Rolling walker (2 wheels) Transfers: Sit to/from Stand Sit to Stand: Supervision           General transfer comment: verbal cues for LLE positioning with sitting for comfort. patient is able to stand from bed and from elevated toilet with no physical assistance    Ambulation/Gait Ambulation/Gait assistance: Contact guard assist, Supervision Gait Distance (Feet):  (46ft, 80ft) Assistive device: Rolling walker (2 wheels) Gait Pattern/deviations: Step-through pattern, Antalgic, Decreased dorsiflexion - left, Decreased stride length Gait velocity: decreased     General Gait Details: cues for heel toe gait pattern on the left with carry over demonstrated. no loss of balance and no shortness of breath with activity. gait distance is self limited  Stairs            Wheelchair Mobility     Tilt Bed    Modified Rankin (Stroke Patients Only)       Balance Overall balance assessment: Needs assistance Sitting-balance support: Feet supported Sitting balance-Leahy Scale: Good Sitting balance - Comments: patient is able to reach outside base of support without loss of balance   Standing balance support: No upper extremity supported, Bilateral upper extremity supported, Single extremity supported Standing balance-Leahy Scale: Fair Standing balance comment: patient is able to stand to wash hands at sink with no loss of balance  Pertinent Vitals/Pain Pain Assessment Pain Assessment: Faces Faces Pain Scale: Hurts little more Pain Location: L knee with sitting and bed mobility Pain Descriptors / Indicators: Grimacing,  Discomfort Pain Intervention(s): Limited activity within patient's tolerance, Monitored during session, Repositioned (polar care encouraged)    Home Living Family/patient expects to be discharged to:: Private residence Living Arrangements: Alone Available Help at Discharge: Family;Available PRN/intermittently Type of Home: House Home Access: Ramped entrance       Home Layout: Two level;Able to live on main level with bedroom/bathroom Home Equipment: Rolling Walker (2 wheels);Cane - single point;Wheelchair - manual;BSC/3in1      Prior Function Prior Level of Function : Independent/Modified Independent             Mobility Comments: using cane for ambulation       Extremity/Trunk Assessment   Upper Extremity Assessment Upper Extremity Assessment: Overall WFL for tasks assessed    Lower Extremity Assessment Lower Extremity Assessment: LLE deficits/detail LLE Deficits / Details: patient can complete partial SLR, limited by body habitus. ROM of L ankle limited at baseline LLE Sensation: WNL       Communication   Communication Communication: No apparent difficulties  Cognition Arousal: Alert Behavior During Therapy: WFL for tasks assessed/performed Overall Cognitive Status: Within Functional Limits for tasks assessed                                          General Comments      Exercises Total Joint Exercises Ankle Circles/Pumps: AROM, Strengthening, Both, 10 reps, Supine Quad Sets: AROM, Strengthening, Left, 5 reps, Supine Straight Leg Raises: AROM, Strengthening, Left, Supine (2 reps) Goniometric ROM: L knee flexion 75 degrees measured in sitting position Other Exercises Other Exercises: reviewed all exercises in the TKA handout with verbal understanding of all exercises   Assessment/Plan    PT Assessment Patient needs continued PT services  PT Problem List Decreased strength;Decreased range of motion;Decreased activity tolerance;Decreased  balance;Decreased mobility;Decreased safety awareness;Pain       PT Treatment Interventions DME instruction;Gait training;Stair training;Functional mobility training;Therapeutic activities;Therapeutic exercise;Patient/family education    PT Goals (Current goals can be found in the Care Plan section)  Acute Rehab PT Goals Patient Stated Goal: to go home PT Goal Formulation: With patient Time For Goal Achievement: 05/15/23 Potential to Achieve Goals: Good    Frequency BID     Co-evaluation               AM-PAC PT "6 Clicks" Mobility  Outcome Measure Help needed turning from your back to your side while in a flat bed without using bedrails?: None Help needed moving from lying on your back to sitting on the side of a flat bed without using bedrails?: A Little Help needed moving to and from a bed to a chair (including a wheelchair)?: A Little Help needed standing up from a chair using your arms (e.g., wheelchair or bedside chair)?: A Little Help needed to walk in hospital room?: A Little Help needed climbing 3-5 steps with a railing? : A Little 6 Click Score: 19    End of Session Equipment Utilized During Treatment: Gait belt Activity Tolerance: Patient tolerated treatment well Patient left: in bed;with call bell/phone within reach;with SCD's reapplied (SCD RLE, polar care L knee) Nurse Communication: Mobility status PT Visit Diagnosis: Difficulty in walking, not elsewhere classified (R26.2);Other abnormalities of gait and mobility (R26.89)  Time: 0915-0950 PT Time Calculation (min) (ACUTE ONLY): 35 min   Charges:   PT Evaluation $PT Eval Low Complexity: 1 Low PT Treatments $Gait Training: 8-22 mins $Therapeutic Exercise: 8-22 mins PT General Charges $$ ACUTE PT VISIT: 1 Visit         Donna Bernard, PT, MPT   [name] 05/01/2023, 10:12 AM

## 2023-05-01 NOTE — Evaluation (Signed)
Occupational Therapy Evaluation Patient Details Name: Rita Foster MRN: [id number] DOB: 1960-07-26 Today's Date: 05/01/2023   History of Present Illness Patient is a 62 year old female with degenerative arthrosis of the left knee s/p left total knee arthroplasty. History of avascular necrosis of the left talus and prior right total knee arthroplasty earlier this year   Clinical Impression   Rita Foster was seen for OT evaluation this date. Prior to hospital admission, pt was MODI using  SPC. Pt lives alone, plan to call friends/family PRN. Pt currently requires SUPERVISION bed mobility with use of leg lifter. MAX A don TED hose in sitting. SUPERVISION don underwear/pants. Significantly increased pain seated EOB and with LLE movements - RN notified 10/10 pain. Pt instructed in polar care mgt, falls prevention strategies, DME recs, and compression stocking mgt. All education complete, will sign off. Upon hospital discharge, recommend no OT follow up.     If plan is discharge home, recommend the following: Help with stairs or ramp for entrance    Functional Status Assessment  Patient has had a recent decline in their functional status and demonstrates the ability to make significant improvements in function in a reasonable and predictable amount of time.  Equipment Recommendations  BSC/3in1    Recommendations for Other Services       Precautions / Restrictions Precautions Precautions: Fall;Knee Precaution Booklet Issued: Yes (comment) Restrictions Weight Bearing Restrictions: Yes LLE Weight Bearing: Weight bearing as tolerated      Mobility Bed Mobility Overal bed mobility: Needs Assistance Bed Mobility: Sit to Supine, Supine to Sit     Supine to sit: Supervision Sit to supine: Supervision   General bed mobility comments: use of sheet for leg lifter    Transfers Overall transfer level: Needs assistance Equipment used: Rolling walker (2 wheels) Transfers: Sit to/from  Stand Sit to Stand: Supervision                  Balance Overall balance assessment: Needs assistance Sitting-balance support: Feet supported Sitting balance-Leahy Scale: Good     Standing balance support: No upper extremity supported Standing balance-Leahy Scale: Fair                             ADL either performed or assessed with clinical judgement   ADL Overall ADL's : Needs assistance/impaired                                       General ADL Comments: MAX A don TED hose in sitting. SUPERVISION don underwear/pants.      Pertinent Vitals/Pain Pain Assessment Pain Assessment: 0-10 Pain Score: 10-Worst pain ever Pain Location: L knee with sitting and bed mobility Pain Descriptors / Indicators: Grimacing, Discomfort Pain Intervention(s): Limited activity within patient's tolerance, Repositioned, Patient requesting pain meds-RN notified     Extremity/Trunk Assessment Upper Extremity Assessment Upper Extremity Assessment: Overall WFL for tasks assessed   Lower Extremity Assessment Lower Extremity Assessment: Defer to PT evaluation LLE Deficits / Details: patient can complete partial SLR, limited by body habitus. ROM of L ankle limited at baseline LLE Sensation: WNL       Communication Communication Communication: No apparent difficulties   Cognition Arousal: Alert Behavior During Therapy: WFL for tasks assessed/performed Overall Cognitive Status: Within Functional Limits for tasks assessed  Home Living Family/patient expects to be discharged to:: Private residence Living Arrangements: Alone Available Help at Discharge: Family;Available PRN/intermittently;Friend(s) Type of Home: House Home Access: Ramped entrance     Home Layout: Two level;Able to live on main level with bedroom/bathroom     Bathroom Shower/Tub: Chief Strategy Officer:  Standard     Home Equipment: Agricultural consultant (2 wheels);Cane - single point;Wheelchair - manual;BSC/3in1   Additional Comments: will call cousin or friend if needed but maily alone      Prior Functioning/Environment Prior Level of Function : Independent/Modified Independent             Mobility Comments: using cane for ambulation          OT Problem List: Decreased activity tolerance;Pain         OT Goals(Current goals can be found in the care plan section) Acute Rehab OT Goals Patient Stated Goal: to improve pain OT Goal Formulation: With patient Time For Goal Achievement: 05/01/23 Potential to Achieve Goals: Good   AM-PAC OT "6 Clicks" Daily Activity     Outcome Measure Help from another person eating meals?: None Help from another person taking care of personal grooming?: None Help from another person toileting, which includes using toliet, bedpan, or urinal?: A Little Help from another person bathing (including washing, rinsing, drying)?: A Little Help from another person to put on and taking off regular upper body clothing?: None Help from another person to put on and taking off regular lower body clothing?: A Little 6 Click Score: 21   End of Session Nurse Communication: Patient requests pain meds  Activity Tolerance: Patient tolerated treatment well Patient left: in bed;with call bell/phone within reach;with nursing/sitter in room  OT Visit Diagnosis: Unsteadiness on feet (R26.81)                Time: 1004-1030 OT Time Calculation (min): 26 min Charges:  OT General Charges $OT Visit: 1 Visit OT Evaluation $OT Eval Low Complexity: 1 Low OT Treatments $Self Care/Home Management : 8-22 mins  Rita Foster, M.S. OTR/L  05/01/23, 10:39 AM  ascom [phone]

## 2023-05-01 NOTE — Discharge Summary (Signed)
Physician Discharge Summary  Subjective: 1 Day Post-Op Procedure(s) (LRB): COMPUTER ASSISTED TOTAL KNEE ARTHROPLASTY (Left) Patient reports pain as mild.   Patient seen in rounds with Dr. Ernest Pine. Patient is well, and has had no acute complaints or problems.  Denies any CP, SOB, N/B, fevers or chills We will continue with therapy today.  Patient is ready to go home  Physician Discharge Summary  [name] MRN: [id number] DOB/AGE: 1961/02/16 62 y.o.  Admit date: 04/30/2023 Discharge date: 05/01/2023  Admission Diagnoses:  Discharge Diagnoses:  Principal Problem:   Total knee replacement status   Discharged Condition: good  Hospital Course: Patient presented to the hospital on 04/30/2023 for an elective left total knee arthroplasty performed by Dr. Ernest Pine.  Patient was given 1 g of TXA and 2 g of Ancef perioperatively.  She tolerated the procedure well without any complications.  See procedural details below.  Postoperatively, the patient did well.  Her pain was well under control from postoperative block and oral medications.  She was able to work with PT and pass her protocols postoperative day 1.  She was able to void her bladder.  She denies any fevers, chills, shortness of breath, CP or nausea/vomiting.  Her JP drain was removed without difficulty, intact.  Patient's vital signs are stable.  Patient is stable for discharge.  PROCEDURE:  Left total knee arthroplasty using computer-assisted navigation   SURGEON:  Jena Gauss. M.D.   ASSISTANT:  Gean Birchwood, PA-C (present and scrubbed throughout the case, critical for assistance with exposure, retraction, instrumentation, and closure)   ANESTHESIA: spinal   ESTIMATED BLOOD LOSS: 50 mL   FLUIDS REPLACED: 800 mL of crystalloid   TOURNIQUET TIME: 89 minutes   DRAINS: 2 medium Hemovac drains   SOFT TISSUE RELEASES: Anterior cruciate ligament, posterior cruciate ligament, deep medial collateral  ligament, patellofemoral ligament   IMPLANTS UTILIZED: DePuy Attune size 6 posterior stabilized femoral component (cemented), size 6 rotating platform tibial component (cemented), 38 mm medialized dome patella (cemented), and a 10 mm stabilized rotating platform polyethylene insert.    Treatments: None  Discharge Exam: Blood pressure 125/78, pulse 62, temperature 98.3 F (36.8 C), temperature source Temporal, resp. rate 16, height 5\' 8"  (1.727 m), weight 112.8 kg, SpO2 98%.   Disposition: Home   Allergies as of 05/01/2023       Reactions   Gabapentin Other (See Comments)   dizziness   Mobic [meloxicam] Other (See Comments)   Abdominal pain   Penicillins    IgE = 89 (WNL) on 06/30/2022 As a child-unsure of reaction        Medication List     TAKE these medications    acyclovir 400 MG tablet Commonly known as: ZOVIRAX Take 1 tablet (400 mg total) by mouth every morning.   amLODipine 10 MG tablet Commonly known as: NORVASC Take 1 tablet (10 mg total) by mouth every morning. TAKE 1 TABLET(10 MG) BY MOUTH DAILY   aspirin EC 81 MG tablet Take 1 tablet (81 mg total) by mouth 2 (two) times daily. Swallow whole. What changed: when to take this   Blood Pressure Monitor/Wrist Devi To check bp daily   celecoxib 200 MG capsule Commonly known as: CELEBREX Take 1 capsule (200 mg total) by mouth 2 (two) times daily.   diclofenac Sodium 1 % Gel Commonly known as: VOLTAREN Apply 2 g topically in the morning and at bedtime.   losartan 100 MG tablet Commonly known as: COZAAR Take 1  tablet (100 mg total) by mouth every morning.   Multiple Vitamin tablet Take 1 tablet by mouth daily.   oxyCODONE 5 MG immediate release tablet Commonly known as: Oxy IR/ROXICODONE Take 1 tablet (5 mg total) by mouth every 4 (four) hours as needed for moderate pain (pain score 4-6) (pain score 4-6).   rosuvastatin 20 MG tablet Commonly known as: Crestor Take 1 tablet (20 mg total) by mouth  daily.   traMADol 50 MG tablet Commonly known as: ULTRAM Take 1-2 tablets (50-100 mg total) by mouth every 4 (four) hours as needed for moderate pain (pain score 4-6).   traZODone 50 MG tablet Commonly known as: DESYREL Take 1 tablet (50 mg total) by mouth at bedtime as needed for sleep.   Tylenol 8 Hour Arthritis Pain 650 MG CR tablet Generic drug: acetaminophen Take 1,300 mg by mouth 2 (two) times daily.               Durable Medical Equipment  (From admission, onward)           Start     Ordered   04/30/23 1837  DME Walker rolling  Once       Question:  Patient needs a walker to treat with the following condition  Answer:  Total knee replacement status   04/30/23 1836   04/30/23 1837  DME Bedside commode  Once       Comments: Patient is not able to walk the distance required to go the bathroom, or he/she is unable to safely negotiate stairs required to access the bathroom.  A 3in1 BSC will alleviate this problem  Question:  Patient needs a bedside commode to treat with the following condition  Answer:  Total knee replacement status   04/30/23 1836            Follow-up Information     Rayburn Go, PA-C Follow up on 05/15/2023.   Specialty: Orthopedic Surgery Why: at 10:45am Contact information: [address] [place] [address] [phone]         Donato Heinz, MD Follow up on 06/12/2023.   Specialty: Orthopedic Surgery Why: at 2:45pm Contact information: [address] [facility] [place] [address] [phone]                 Signed: Gean Birchwood 05/01/2023, 8:39 AM   Objective: Vital signs in last 24 hours: Temp:  [97 F (36.1 C)-98.3 F (36.8 C)] 98.3 F (36.8 C) (10/29 0828) Pulse Rate:  [58-92] 62 (10/29 0828) Resp:  [11-20] 16 (10/29 0828) BP: (94-147)/(46-85) 125/78 (10/29 0828) SpO2:  [96 %-100 %] 98 % (10/29 0828) Weight:  [112.8 kg] 112.8 kg (10/28 1001)  Intake/Output from  previous day:  Intake/Output Summary (Last 24 hours) at 05/01/2023 0839 Last data filed at 05/01/2023 0650 Gross per 24 hour  Intake 1400 ml  Output 885 ml  Net 515 ml    Intake/Output this shift: No intake/output data recorded.  Labs: No results for input(s): "HGB" in the last 72 hours. No results for input(s): "WBC", "RBC", "HCT", "PLT" in the last 72 hours. No results for input(s): "NA", "K", "CL", "CO2", "BUN", "CREATININE", "GLUCOSE", "CALCIUM" in the last 72 hours. No results for input(s): "LABPT", "INR" in the last 72 hours.  EXAM: General - Patient is Alert, Appropriate, and Oriented Extremity - Neurologically intact ABD soft Neurovascular intact Sensation intact distally Intact pulses distally Dorsiflexion/Plantar flexion intact No cellulitis present Compartment soft Dressing - dressing C/D/I  and no drainage Motor Function - intact, moving foot and toes well on exam. Able to plantar and dorsiflex with good strength and range of motion.  Able to wiggle her toes without difficulty.  Requires some assistance performing SLR.  She is neurovascular intact all dermatomes on her left lower extremity.  Posterior tibial pulses appreciated, 2+. JP Drain pulled without difficulty. Intact  Assessment/Plan: 1 Day Post-Op Procedure(s) (LRB): COMPUTER ASSISTED TOTAL KNEE ARTHROPLASTY (Left) Procedure(s) (LRB): COMPUTER ASSISTED TOTAL KNEE ARTHROPLASTY (Left) Past Medical History:  Diagnosis Date   Asthma    Basal cell carcinoma 05/10/2022   Left nasal sidewall. Nodular type. Mohs pending.   Hyperlipidemia    Hypertension    Pre-diabetes    Principal Problem:   Total knee replacement status  Estimated body mass index is 37.8 kg/m as calculated from the following:   Height as of this encounter: 5\' 8"  (1.727 m).   Weight as of this encounter: 112.8 kg.  Patient has passed her PT protocols and is stable for discharge home.  Look to transition to at home or outpatient  physical therapy.  Will continue to work on gait, strength and ROM of the left lower extremity.  Discussed with the patient continuing to utilize Polar Care   Patient will use bone foam in 20-30 minute intervals   Patient will wear TED hose bilaterally to help prevent DVT and clot formation   Discussed the Aquacel bandage.  This bandage will stay in place 7 days postoperatively.  Can be replaced with honeycomb bandages that will be sent home with the patient   Discussed sending the patient home with tramadol and oxycodone for as needed pain management.  Patient will also be sent home with Celebrex to help with swelling and inflammation.  Patient will take an 81 mg aspirin twice daily for DVT prophylaxis   JP drain removed without difficulty, intact   Weight-Bearing as tolerated to left leg   Patient will follow-up with [facility] orthopedics in 2 weeks for staple removal and reevaluation  Diet - Regular diet Follow up - in 2 weeks Activity - WBAT Disposition - Home Condition Upon Discharge - Good DVT Prophylaxis - Aspirin and TED hose  Danise Edge, PA-C Orthopaedic Surgery 05/01/2023, 8:39 AM

## 2023-05-01 NOTE — Plan of Care (Signed)
  Problem: Activity: Goal: Ability to avoid complications of mobility impairment will improve Outcome: Progressing   Problem: Clinical Measurements: Goal: Postoperative complications will be avoided or minimized Outcome: Progressing   Problem: Pain Management: Goal: Pain level will decrease with appropriate interventions Outcome: Progressing   

## 2023-05-01 NOTE — Progress Notes (Signed)
D/c pt home in care of her friend.  Pt had all belongings with her.  Pt double checked her bags.  All questions and concerns by pt were addressed and resolved.  Pt transported in wheelchair to car. Safety maintained.

## 2023-05-01 NOTE — Progress Notes (Signed)
Subjective: 1 Day Post-Op Procedure(s) (LRB): COMPUTER ASSISTED TOTAL KNEE ARTHROPLASTY (Left) Patient reports pain as mild.   Patient seen in rounds with Dr. Ernest Pine. Patient is well, and has had no acute complaints or problems.  Denies any CP, SOB, N/B, fevers or chills We will start therapy today.  Plan is to go Home after hospital stay.  Objective: Vital signs in last 24 hours: Temp:  [97 F (36.1 C)-98.1 F (36.7 C)] 98.1 F (36.7 C) (10/29 0647) Pulse Rate:  [58-92] 68 (10/29 0647) Resp:  [11-20] 16 (10/29 0647) BP: (94-147)/(46-85) 134/84 (10/29 0647) SpO2:  [96 %-100 %] 96 % (10/29 0647) Weight:  [112.8 kg] 112.8 kg (10/28 1001)  Intake/Output from previous day:  Intake/Output Summary (Last 24 hours) at 05/01/2023 0825 Last data filed at 05/01/2023 0650 Gross per 24 hour  Intake 1400 ml  Output 885 ml  Net 515 ml    Intake/Output this shift: No intake/output data recorded.  Labs: No results for input(s): "HGB" in the last 72 hours. No results for input(s): "WBC", "RBC", "HCT", "PLT" in the last 72 hours. No results for input(s): "NA", "K", "CL", "CO2", "BUN", "CREATININE", "GLUCOSE", "CALCIUM" in the last 72 hours. No results for input(s): "LABPT", "INR" in the last 72 hours.  EXAM General - Patient is Alert, Appropriate, and Oriented Extremity - Neurologically intact ABD soft Neurovascular intact Sensation intact distally Intact pulses distally Dorsiflexion/Plantar flexion intact No cellulitis present Compartment soft Dressing - dressing C/D/I and no drainage Motor Function - intact, moving foot and toes well on exam. Able to plantar and dorsiflex with good strength and range of motion.  Able to wiggle her toes without difficulty.  Requires some assistance performing SLR.  She is neurovascular intact all dermatomes on her left lower extremity.  Posterior tibial pulses appreciated, 2+. JP Drain pulled without difficulty. Intact  Past Medical History:   Diagnosis Date   Asthma    Basal cell carcinoma 05/10/2022   Left nasal sidewall. Nodular type. Mohs pending.   Hyperlipidemia    Hypertension    Pre-diabetes     Assessment/Plan: 1 Day Post-Op Procedure(s) (LRB): COMPUTER ASSISTED TOTAL KNEE ARTHROPLASTY (Left) Principal Problem:   Total knee replacement status  Estimated body mass index is 37.8 kg/m as calculated from the following:   Height as of this encounter: 5\' 8"  (1.727 m).   Weight as of this encounter: 112.8 kg. Advance diet Up with therapy  Patient will continue to work with physical therapy to pass postoperative PT protocols, ROM and strengthening  Discussed with the patient continuing to utilize Polar Care  Patient will use bone foam in 20-30 minute intervals  Patient will wear TED hose bilaterally to help prevent DVT and clot formation  Discussed the Aquacel bandage.  This bandage will stay in place 7 days postoperatively.  Can be replaced with honeycomb bandages that will be sent home with the patient  Discussed sending the patient home with tramadol and oxycodone for as needed pain management.  Patient will also be sent home with Celebrex to help with swelling and inflammation.  Patient will take an 81 mg aspirin twice daily for DVT prophylaxis  JP drain removed without difficulty, intact  Weight-Bearing as tolerated to left leg  Patient will follow-up with [facility] clinic orthopedics in 2 weeks for staple removal and reevaluation  Rayburn Go, PA-C [facility] 05/01/2023, 8:25 AM

## 2023-05-01 NOTE — Anesthesia Postprocedure Evaluation (Signed)
Anesthesia Post Note  Patient: MILLS AUTRY  Procedure(s) Performed: COMPUTER ASSISTED TOTAL KNEE ARTHROPLASTY (Left: Knee)  Anesthesia Type: Combined General/Spinal and Spinal Level of consciousness: awake, awake and alert and oriented Pain management: pain level controlled Vital Signs Assessment: post-procedure vital signs reviewed and stable Respiratory status: spontaneous breathing, nonlabored ventilation and respiratory function stable Cardiovascular status: blood pressure returned to baseline and stable Postop Assessment: no headache and no backache Anesthetic complications: no  There were no known notable events for this encounter.   Last Vitals:  Vitals:   05/01/23 0647 05/01/23 0828  BP: 134/84 125/78  Pulse: 68 62  Resp: 16 16  Temp: 36.7 C 36.8 C  SpO2: 96% 98%    Last Pain:  Vitals:   05/01/23 0828  TempSrc: Temporal  PainSc:                  Ginger Carne

## 2023-05-01 NOTE — Progress Notes (Signed)
PT Cancellation Note  Patient Details Name: Rita Foster MRN: [id number] DOB: Jun 26, 1961   Cancelled Treatment:    Reason Eval/Treat Not Completed: Patient declined, no reason specified (Patient declined second PT session today, reporting fatigue. She anticipates discharge home this afternoon.)  Donna Bernard, PT, MPT  [name] 05/01/2023, 12:43 PM

## 2023-05-17 ENCOUNTER — Encounter: Payer: Managed Care, Other (non HMO) | Admitting: Dermatology

## 2023-05-17 ENCOUNTER — Other Ambulatory Visit: Payer: Self-pay | Admitting: Family Medicine

## 2023-05-17 DIAGNOSIS — I10 Essential (primary) hypertension: Secondary | ICD-10-CM

## 2023-05-17 NOTE — Telephone Encounter (Signed)
Requested Prescriptions  Refused Prescriptions Disp Refills   amLODipine (NORVASC) 10 MG tablet [Pharmacy Med Name: amLODIPine Besylate 10 MG Oral Tablet] 90 tablet 3    Sig: TAKE 1 TABLET BY MOUTH DAILY  EVERY MORNING     Cardiovascular: Calcium Channel Blockers 2 Passed - 05/17/2023  1:21 AM      Passed - Last BP in normal range    BP Readings from Last 1 Encounters:  05/01/23 132/64         Passed - Last Heart Rate in normal range    Pulse Readings from Last 1 Encounters:  05/01/23 69         Passed - Valid encounter within last 6 months    Recent Outpatient Visits           3 months ago Morbid obesity [facility])   [place] [facility] Jacky Kindle, FNP   11 months ago Annual physical exam   [facility] Jacky Kindle, FNP   1 year ago Morbid obesity [facility])   [place] [facility] Jacky Kindle, FNP   1 year ago Annual physical exam   [facility] Merita Norton T, FNP   2 years ago Chronic pain of both knees   [place] [facility] Chrismon, Jodell Cipro, PA-C       Future Appointments             In 1 month Suzie Portela, Daryl Eastern, FNP [facility]

## 2023-05-29 ENCOUNTER — Other Ambulatory Visit: Payer: Self-pay | Admitting: Family Medicine

## 2023-05-29 DIAGNOSIS — I10 Essential (primary) hypertension: Secondary | ICD-10-CM

## 2023-05-30 NOTE — Telephone Encounter (Signed)
Requested by interface surescripts. Future visit in 2 weeks. Requested Prescriptions  Pending Prescriptions Disp Refills   losartan (COZAAR) 100 MG tablet [Pharmacy Med Name: Losartan Potassium 100 MG Oral Tablet] 90 tablet 0    Sig: TAKE 1 TABLET BY MOUTH EVERY  MORNING     Cardiovascular:  Angiotensin Receptor Blockers Passed - 05/29/2023 10:30 PM      Passed - Cr in normal range and within 180 days    Creatinine, Ser  Date Value Ref Range Status  04/23/2023 0.60 0.44 - 1.00 mg/dL Final         Passed - K in normal range and within 180 days    Potassium  Date Value Ref Range Status  04/23/2023 3.7 3.5 - 5.1 mmol/L Final         Passed - Patient is not pregnant      Passed - Last BP in normal range    BP Readings from Last 1 Encounters:  05/01/23 132/64         Passed - Valid encounter within last 6 months    Recent Outpatient Visits           3 months ago Morbid obesity [facility])   [place] [facility] Jacky Kindle, FNP   11 months ago Annual physical exam   [facility] Jacky Kindle, FNP   1 year ago Morbid obesity [facility])   [place] [facility] Jacky Kindle, FNP   1 year ago Annual physical exam   [facility] Merita Norton T, FNP   2 years ago Chronic pain of both knees   [place] [facility] Chrismon, Jodell Cipro, PA-C       Future Appointments             In 2 weeks Jacky Kindle, FNP [facility], [facility]

## 2023-06-11 ENCOUNTER — Encounter: Payer: Managed Care, Other (non HMO) | Admitting: Dermatology

## 2023-06-19 ENCOUNTER — Ambulatory Visit: Payer: Managed Care, Other (non HMO) | Admitting: Family Medicine

## 2023-06-19 ENCOUNTER — Encounter: Payer: Self-pay | Admitting: Family Medicine

## 2023-06-19 VITALS — BP 135/65 | HR 65 | Ht 68.0 in | Wt 255.0 lb

## 2023-06-19 DIAGNOSIS — R7303 Prediabetes: Secondary | ICD-10-CM | POA: Diagnosis not present

## 2023-06-19 DIAGNOSIS — E782 Mixed hyperlipidemia: Secondary | ICD-10-CM

## 2023-06-19 DIAGNOSIS — Z0000 Encounter for general adult medical examination without abnormal findings: Secondary | ICD-10-CM | POA: Diagnosis not present

## 2023-06-19 DIAGNOSIS — Z780 Asymptomatic menopausal state: Secondary | ICD-10-CM

## 2023-06-19 DIAGNOSIS — M25561 Pain in right knee: Secondary | ICD-10-CM | POA: Diagnosis not present

## 2023-06-19 DIAGNOSIS — I10 Essential (primary) hypertension: Secondary | ICD-10-CM | POA: Insufficient documentation

## 2023-06-19 DIAGNOSIS — M25562 Pain in left knee: Secondary | ICD-10-CM

## 2023-06-19 DIAGNOSIS — G8929 Other chronic pain: Secondary | ICD-10-CM | POA: Insufficient documentation

## 2023-06-19 DIAGNOSIS — Z124 Encounter for screening for malignant neoplasm of cervix: Secondary | ICD-10-CM

## 2023-06-19 DIAGNOSIS — M25572 Pain in left ankle and joints of left foot: Secondary | ICD-10-CM

## 2023-06-19 DIAGNOSIS — Z8249 Family history of ischemic heart disease and other diseases of the circulatory system: Secondary | ICD-10-CM

## 2023-06-19 DIAGNOSIS — M8700 Idiopathic aseptic necrosis of unspecified bone: Secondary | ICD-10-CM

## 2023-06-19 DIAGNOSIS — Z1231 Encounter for screening mammogram for malignant neoplasm of breast: Secondary | ICD-10-CM | POA: Insufficient documentation

## 2023-06-20 ENCOUNTER — Telehealth: Payer: Self-pay | Admitting: Family Medicine

## 2023-06-20 LAB — COMPREHENSIVE METABOLIC PANEL
ALT: 17 [IU]/L (ref 0–32)
AST: 23 [IU]/L (ref 0–40)
Albumin: 4.3 g/dL (ref 3.9–4.9)
Alkaline Phosphatase: 89 [IU]/L (ref 44–121)
BUN/Creatinine Ratio: 27 (ref 12–28)
BUN: 18 mg/dL (ref 8–27)
Bilirubin Total: 0.2 mg/dL (ref 0.0–1.2)
CO2: 23 mmol/L (ref 20–29)
Calcium: 10.3 mg/dL (ref 8.7–10.3)
Chloride: 102 mmol/L (ref 96–106)
Creatinine, Ser: 0.66 mg/dL (ref 0.57–1.00)
Globulin, Total: 2.7 g/dL (ref 1.5–4.5)
Glucose: 93 mg/dL (ref 70–99)
Potassium: 4.6 mmol/L (ref 3.5–5.2)
Sodium: 139 mmol/L (ref 134–144)
Total Protein: 7 g/dL (ref 6.0–8.5)
eGFR: 99 mL/min/{1.73_m2} (ref >59)

## 2023-06-20 LAB — LIPOPROTEIN A (LPA): Lipoprotein (a): 131.6 nmol/L — ABNORMAL HIGH (ref <75.0)

## 2023-06-20 LAB — LIPID PANEL
Chol/HDL Ratio: 2.8 {ratio} (ref 0.0–4.4)
Cholesterol, Total: 167 mg/dL (ref 100–199)
HDL: 60 mg/dL (ref >39)
LDL Chol Calc (NIH): 75 mg/dL (ref 0–99)
Triglycerides: 195 mg/dL — ABNORMAL HIGH (ref 0–149)
VLDL Cholesterol Cal: 32 mg/dL (ref 5–40)

## 2023-06-20 LAB — CBC WITH DIFFERENTIAL/PLATELET
Basophils Absolute: 0 10*3/uL (ref 0.0–0.2)
Basos: 0 %
EOS (ABSOLUTE): 0.3 10*3/uL (ref 0.0–0.4)
Eos: 3 %
Hematocrit: 40.9 % (ref 34.0–46.6)
Hemoglobin: 12.8 g/dL (ref 11.1–15.9)
Immature Grans (Abs): 0 10*3/uL (ref 0.0–0.1)
Immature Granulocytes: 0 %
Lymphocytes Absolute: 2.8 10*3/uL (ref 0.7–3.1)
Lymphs: 31 %
MCH: 29.2 pg (ref 26.6–33.0)
MCHC: 31.3 g/dL — ABNORMAL LOW (ref 31.5–35.7)
MCV: 93 fL (ref 79–97)
Monocytes Absolute: 0.7 10*3/uL (ref 0.1–0.9)
Monocytes: 7 %
Neutrophils Absolute: 5.3 10*3/uL (ref 1.4–7.0)
Neutrophils: 59 %
Platelets: 374 10*3/uL (ref 150–450)
RBC: 4.39 x10E6/uL (ref 3.77–5.28)
RDW: 12.1 % (ref 11.7–15.4)
WBC: 9.1 10*3/uL (ref 3.4–10.8)

## 2023-06-20 LAB — VITAMIN D 25 HYDROXY (VIT D DEFICIENCY, FRACTURES): Vit D, 25-Hydroxy: 35.2 ng/mL (ref 30.0–100.0)

## 2023-06-20 LAB — TSH: TSH: 1.43 u[IU]/mL (ref 0.450–4.500)

## 2023-06-20 LAB — HEMOGLOBIN A1C
Est. average glucose Bld gHb Est-mCnc: 114 mg/dL
Hgb A1c MFr Bld: 5.6 % (ref 4.8–5.6)

## 2023-06-20 NOTE — Telephone Encounter (Signed)
Rita Foster with [facility] is calling in because they received an order for a Pap Smear but it was put in wrong and needs to be sent over correctly. Please follow up with [facility]

## 2023-06-20 NOTE — Progress Notes (Signed)
LP(a) was quite elevated. Consider coronary calcium scan or follow up with cardiology for stress test. Continue Crestor 20 mg- could increase to 40 mg if desired to assist with known LP(a) elevation.  Other labs remain stable.

## 2023-06-20 NOTE — Telephone Encounter (Signed)
Jeanine called stated they are not sure if its a [facility] order or not as the order# is not for Cytology. Jeanine sttd since its not a Cytology order# it will not come over for them. 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 Yehuda Mao says they just need to know what is needed. Please f/u with Yehuda Mao

## 2023-06-21 ENCOUNTER — Encounter: Payer: Self-pay | Admitting: Family Medicine

## 2023-06-21 DIAGNOSIS — Z8249 Family history of ischemic heart disease and other diseases of the circulatory system: Secondary | ICD-10-CM | POA: Insufficient documentation

## 2023-06-21 DIAGNOSIS — Z780 Asymptomatic menopausal state: Secondary | ICD-10-CM | POA: Insufficient documentation

## 2023-06-21 NOTE — Assessment & Plan Note (Signed)
Chronic, add vitamin labs to assist Consider DEXA going forward given chronic knee pain and L ankle AVN

## 2023-06-21 NOTE — Assessment & Plan Note (Signed)
Due for screening for mammogram, denies breast concerns, provided with phone number to call and schedule appointment for mammogram. Encouraged to repeat breast cancer screening every 1-2 years.  

## 2023-06-21 NOTE — Assessment & Plan Note (Signed)
Chronic, remains elevated Ongoing ortho pain Goal remains 119/79 Continue norvasc 10; Continue losartan 100

## 2023-06-21 NOTE — Assessment & Plan Note (Signed)
Recommend LP(a) given chronic risk factors and known personal hx Already on statin  Denies chest pain or pressure

## 2023-06-21 NOTE — Progress Notes (Signed)
Complete physical exam   Patient: Rita Foster   DOB: Jan 17, 1961   62 y.o. Female  MRN: [id number] Visit Date: 06/19/2023  Today's healthcare provider: Jacky Kindle, FNP  Introduced to nurse practitioner role and practice setting.  All questions answered.  Discussed provider/patient relationship and expectations.  Chief Complaint  Patient presents with   Annual Exam   Subjective    Rita Foster is a 62 y.o. female who presents today for a complete physical exam.  She reports consuming a general diet.  Has completed PT for L knee replacement and continues to follow their notes/recommendations  She generally feels fairly well. She reports sleeping fairly well. She does have additional problems to discuss today.   HPI   Request for repeat PAP smear given concern for use of talcum powder as well as history of multiple sexual partners.  Past Medical History:  Diagnosis Date   Asthma    Basal cell carcinoma 05/10/2022   Left nasal sidewall. Nodular type. Mohs pending.   Hyperlipidemia    Hypertension    Pre-diabetes    Past Surgical History:  Procedure Laterality Date   BREAST BIOPSY Left 10+ years ago   Negative core   BREAST EXCISIONAL BIOPSY Left    2002   CATARACT EXTRACTION Right 07/2014   CATARACT EXTRACTION Left 2016   HERNIA REPAIR  02/21/2010   KNEE ARTHROPLASTY Right 07/12/2022   Procedure: COMPUTER ASSISTED TOTAL KNEE ARTHROPLASTY;  Surgeon: Donato Heinz, MD;  Location: [facility] ORS;  Service: Orthopedics;  Laterality: Right;   KNEE ARTHROPLASTY Left 04/30/2023   Procedure: COMPUTER ASSISTED TOTAL KNEE ARTHROPLASTY;  Surgeon: Donato Heinz, MD;  Location: [facility];  Service: Orthopedics;  Laterality: Left;   TONSILLECTOMY     TONSILLECTOMY AND ADENOIDECTOMY  1968   Social History   Socioeconomic History   Marital status: Divorced    Spouse name: Not on file   Number of children: Not on file   Years of education: Not on file   Highest  education level: Not on file  Occupational History   Not on file  Tobacco Use   Smoking status: Former    Current packs/day: 0.00    Types: Cigarettes    Start date: 07/02/1994    Quit date: 07/03/1999    Years since quitting: 23.9   Smokeless tobacco: Never  Vaping Use   Vaping status: Never Used  Substance and Sexual Activity   Alcohol use: No   Drug use: No   Sexual activity: Not on file  Other Topics Concern   Not on file  Social History Narrative   Not on file   Social Drivers of Health   Financial Resource Strain: Not on file  Food Insecurity: No Food Insecurity (04/30/2023)   Hunger Vital Sign    Worried About Running Out of Food in the Last Year: Never true    Ran Out of Food in the Last Year: Never true  Transportation Needs: No Transportation Needs (04/30/2023)   PRAPARE - Administrator, Civil Service (Medical): No    Lack of Transportation (Non-Medical): No  Physical Activity: Not on file  Stress: Not on file  Social Connections: Not on file  Intimate Partner Violence: Not At Risk (04/30/2023)   Humiliation, Afraid, Rape, and Kick questionnaire    Fear of Current or Ex-Partner: No    Emotionally Abused: No    Physically Abused: No    Sexually Abused: No  Family Status  Relation Name Status   Mother  Deceased   Father  Alive   MGM  Deceased at age 23       natural causes   MGF  Deceased       artherosclerosis   PGM  Deceased       old age   PGF  Deceased       unknown causes  No partnership data on file   Family History  Problem Relation Age of Onset   Crohn's disease Mother    Thyroid disease Mother    Rheum arthritis Mother    Breast cancer Mother 62   Hypertension Father    Skin cancer Father    Allergies  Allergen Reactions   Gabapentin Other (See Comments)    dizziness   Mobic [Meloxicam] Other (See Comments)    Abdominal pain   Penicillins     IgE = 89 (WNL) on 06/30/2022  As a child-unsure of reaction     Patient Care Team: Sherlyn Hay, DO as PCP - General (Family Medicine)   Medications: Outpatient Medications Prior to Visit  Medication Sig   acetaminophen (TYLENOL 8 HOUR ARTHRITIS PAIN) 650 MG CR tablet Take 1,300 mg by mouth 2 (two) times daily.   acyclovir (ZOVIRAX) 400 MG tablet Take 1 tablet (400 mg total) by mouth every morning.   amLODipine (NORVASC) 10 MG tablet Take 1 tablet (10 mg total) by mouth every morning. TAKE 1 TABLET(10 MG) BY MOUTH DAILY   aspirin EC 81 MG tablet Take 1 tablet (81 mg total) by mouth 2 (two) times daily. Swallow whole.   Blood Pressure Monitoring (BLOOD PRESSURE MONITOR/WRIST) DEVI To check bp daily   celecoxib (CELEBREX) 200 MG capsule Take 1 capsule (200 mg total) by mouth 2 (two) times daily.   diazepam (VALIUM) 5 MG tablet Take 5 mg by mouth daily as needed for anxiety.   diclofenac Sodium (VOLTAREN) 1 % GEL Apply 2 g topically in the morning and at bedtime.   losartan (COZAAR) 100 MG tablet TAKE 1 TABLET BY MOUTH EVERY  MORNING   Multiple Vitamin tablet Take 1 tablet by mouth daily.    oxyCODONE (OXY IR/ROXICODONE) 5 MG immediate release tablet Take 1 tablet (5 mg total) by mouth every 4 (four) hours as needed for moderate pain (pain score 4-6) (pain score 4-6).   rosuvastatin (CRESTOR) 20 MG tablet Take 1 tablet (20 mg total) by mouth daily.   traMADol (ULTRAM) 50 MG tablet Take 1-2 tablets (50-100 mg total) by mouth every 4 (four) hours as needed for moderate pain (pain score 4-6).   traZODone (DESYREL) 50 MG tablet Take 1 tablet (50 mg total) by mouth at bedtime as needed for sleep.   No facility-administered medications prior to visit.   Last CBC Lab Results  Component Value Date   WBC 9.1 06/19/2023   HGB 12.8 06/19/2023   HCT 40.9 06/19/2023   MCV 93 06/19/2023   MCH 29.2 06/19/2023   RDW 12.1 06/19/2023   PLT 374 06/19/2023   Last metabolic panel Lab Results  Component Value Date   GLUCOSE 93 06/19/2023   NA 139 06/19/2023    K 4.6 06/19/2023   CL 102 06/19/2023   CO2 23 06/19/2023   BUN 18 06/19/2023   CREATININE 0.66 06/19/2023   EGFR 99 06/19/2023   CALCIUM 10.3 06/19/2023   PROT 7.0 06/19/2023   ALBUMIN 4.3 06/19/2023   LABGLOB 2.7 06/19/2023   AGRATIO 1.8 06/09/2022  BILITOT 0.2 06/19/2023   ALKPHOS 89 06/19/2023   AST 23 06/19/2023   ALT 17 06/19/2023   ANIONGAP 9 04/23/2023   Last lipids Lab Results  Component Value Date   CHOL 167 06/19/2023   HDL 60 06/19/2023   LDLCALC 75 06/19/2023   TRIG 195 (H) 06/19/2023   CHOLHDL 2.8 06/19/2023   Last hemoglobin A1c Lab Results  Component Value Date   HGBA1C 5.6 06/19/2023   Last thyroid functions Lab Results  Component Value Date   TSH 1.430 06/19/2023   Last vitamin D Lab Results  Component Value Date   VD25OH 35.2 06/19/2023    Objective    BP 135/65 (BP Location: Right Arm, Patient Position: Sitting, Cuff Size: Large)   Pulse 65   Ht 5\' 8"  (1.727 m)   Wt 255 lb (115.7 kg)   SpO2 97%   BMI 38.77 kg/m   BP Readings from Last 3 Encounters:  06/19/23 135/65  05/01/23 132/64  02/13/23 123/74   Wt Readings from Last 3 Encounters:  06/19/23 255 lb (115.7 kg)  04/30/23 248 lb 9.6 oz (112.8 kg)  04/23/23 248 lb 9.6 oz (112.8 kg)   SpO2 Readings from Last 3 Encounters:  06/19/23 97%  05/01/23 98%  02/13/23 97%   Physical Exam Vitals and nursing note reviewed.  Constitutional:      General: She is awake. She is not in acute distress.    Appearance: Normal appearance. She is well-developed and well-groomed. She is obese. She is not ill-appearing, toxic-appearing or diaphoretic.  HENT:     Head: Normocephalic and atraumatic.     Jaw: There is normal jaw occlusion. No trismus, tenderness, swelling or pain on movement.     Right Ear: Hearing, tympanic membrane, ear canal and external ear normal. There is no impacted cerumen.     Left Ear: Hearing, tympanic membrane, ear canal and external ear normal. There is no impacted  cerumen.     Nose: Nose normal. No congestion or rhinorrhea.     Right Turbinates: Not enlarged, swollen or pale.     Left Turbinates: Not enlarged, swollen or pale.     Right Sinus: No maxillary sinus tenderness or frontal sinus tenderness.     Left Sinus: No maxillary sinus tenderness or frontal sinus tenderness.     Mouth/Throat:     Lips: Pink.     Mouth: Mucous membranes are moist. No injury.     Tongue: No lesions.     Pharynx: Oropharynx is clear. Uvula midline. No pharyngeal swelling, oropharyngeal exudate, posterior oropharyngeal erythema or uvula swelling.     Tonsils: No tonsillar exudate or tonsillar abscesses.  Eyes:     General: Lids are normal. Lids are everted, no foreign bodies appreciated. Vision grossly intact. Gaze aligned appropriately. No allergic shiner or visual field deficit.       Right eye: No discharge.        Left eye: No discharge.     Extraocular Movements: Extraocular movements intact.     Conjunctiva/sclera: Conjunctivae normal.     Right eye: Right conjunctiva is not injected. No exudate.    Left eye: Left conjunctiva is not injected. No exudate.    Pupils: Pupils are equal, round, and reactive to light.  Neck:     Thyroid: No thyroid mass, thyromegaly or thyroid tenderness.     Vascular: No carotid bruit.     Trachea: Trachea normal.  Cardiovascular:     Rate and Rhythm: Normal rate and  regular rhythm.     Pulses: Normal pulses.          Carotid pulses are 2+ on the right side and 2+ on the left side.      Radial pulses are 2+ on the right side and 2+ on the left side.       Dorsalis pedis pulses are 2+ on the right side and 2+ on the left side.       Posterior tibial pulses are 2+ on the right side and 2+ on the left side.     Heart sounds: Normal heart sounds, S1 normal and S2 normal. No murmur heard.    No friction rub. No gallop.  Pulmonary:     Effort: Pulmonary effort is normal. No respiratory distress.     Breath sounds: Normal breath  sounds and air entry. No stridor. No wheezing, rhonchi or rales.  Chest:     Chest wall: No tenderness.  Abdominal:     General: Abdomen is flat. Bowel sounds are normal. There is no distension.     Palpations: Abdomen is soft. There is no mass.     Tenderness: There is no abdominal tenderness. There is no right CVA tenderness, left CVA tenderness, guarding or rebound.     Hernia: No hernia is present.  Genitourinary:    Comments: Exam deferred; denies complaints Musculoskeletal:        General: No swelling, tenderness, deformity or signs of injury. Normal range of motion.     Cervical back: Full passive range of motion without pain, normal range of motion and neck supple. No edema, rigidity or tenderness. No muscular tenderness.     Right lower leg: No edema.     Left lower leg: No edema.  Lymphadenopathy:     Cervical: No cervical adenopathy.     Right cervical: No superficial, deep or posterior cervical adenopathy.    Left cervical: No superficial, deep or posterior cervical adenopathy.  Skin:    General: Skin is warm and dry.     Capillary Refill: Capillary refill takes less than 2 seconds.     Coloration: Skin is not jaundiced or pale.     Findings: No bruising, erythema, lesion or rash.  Neurological:     General: No focal deficit present.     Mental Status: She is alert and oriented to person, place, and time. Mental status is at baseline.     GCS: GCS eye subscore is 4. GCS verbal subscore is 5. GCS motor subscore is 6.     Cranial Nerves: No cranial nerve deficit.     Sensory: Sensation is intact. No sensory deficit.     Motor: Motor function is intact. No weakness.     Coordination: Coordination is intact. Coordination normal.     Gait: Gait is intact. Gait normal.  Psychiatric:        Attention and Perception: Attention and perception normal.        Mood and Affect: Mood and affect normal.        Speech: Speech normal.        Behavior: Behavior normal. Behavior is  cooperative.        Thought Content: Thought content normal.        Cognition and Memory: Cognition and memory normal.        Judgment: Judgment normal.     Last depression screening scores    02/13/2023    8:12 AM 01/04/2022    9:59 AM 09/15/2020  9:53 AM  PHQ 2/9 Scores  PHQ - 2 Score 0 0 3  PHQ- 9 Score  3 13   Last fall risk screening    02/13/2023    8:12 AM  Fall Risk   Falls in the past year? 0  Number falls in past yr: 0  Injury with Fall? 0  Risk for fall due to : No Fall Risks  Follow up Falls evaluation completed   Last Audit-C alcohol use screening    01/04/2022    9:59 AM  Alcohol Use Disorder Test (AUDIT)  1. How often do you have a drink containing alcohol? 0  2. How many drinks containing alcohol do you have on a typical day when you are drinking? 0  3. How often do you have six or more drinks on one occasion? 0  AUDIT-C Score 0   A score of 3 or more in women, and 4 or more in men indicates increased risk for alcohol abuse, EXCEPT if all of the points are from question 1   Results for orders placed or performed in visit on 06/19/23  CBC with Differential/Platelet  Result Value Ref Range   WBC 9.1 3.4 - 10.8 x10E3/uL   RBC 4.39 3.77 - 5.28 x10E6/uL   Hemoglobin 12.8 11.1 - 15.9 g/dL   Hematocrit 11.9 14.7 - 46.6 %   MCV 93 79 - 97 fL   MCH 29.2 26.6 - 33.0 pg   MCHC 31.3 (L) 31.5 - 35.7 g/dL   RDW 82.9 56.2 - 13.0 %   Platelets 374 150 - 450 x10E3/uL   Neutrophils 59 Not Estab. %   Lymphs 31 Not Estab. %   Monocytes 7 Not Estab. %   Eos 3 Not Estab. %   Basos 0 Not Estab. %   Neutrophils Absolute 5.3 1.4 - 7.0 x10E3/uL   Lymphocytes Absolute 2.8 0.7 - 3.1 x10E3/uL   Monocytes Absolute 0.7 0.1 - 0.9 x10E3/uL   EOS (ABSOLUTE) 0.3 0.0 - 0.4 x10E3/uL   Basophils Absolute 0.0 0.0 - 0.2 x10E3/uL   Immature Granulocytes 0 Not Estab. %   Immature Grans (Abs) 0.0 0.0 - 0.1 x10E3/uL  Comprehensive Metabolic Panel (CMET)  Result Value Ref Range    Glucose 93 70 - 99 mg/dL   BUN 18 8 - 27 mg/dL   Creatinine, Ser 8.65 0.57 - 1.00 mg/dL   eGFR 99 >78 IO/NGE/9.52   BUN/Creatinine Ratio 27 12 - 28   Sodium 139 134 - 144 mmol/L   Potassium 4.6 3.5 - 5.2 mmol/L   Chloride 102 96 - 106 mmol/L   CO2 23 20 - 29 mmol/L   Calcium 10.3 8.7 - 10.3 mg/dL   Total Protein 7.0 6.0 - 8.5 g/dL   Albumin 4.3 3.9 - 4.9 g/dL   Globulin, Total 2.7 1.5 - 4.5 g/dL   Bilirubin Total 0.2 0.0 - 1.2 mg/dL   Alkaline Phosphatase 89 44 - 121 IU/L   AST 23 0 - 40 IU/L   ALT 17 0 - 32 IU/L  TSH  Result Value Ref Range   TSH 1.430 0.450 - 4.500 uIU/mL  Hemoglobin A1c  Result Value Ref Range   Hgb A1c MFr Bld 5.6 4.8 - 5.6 %   Est. average glucose Bld gHb Est-mCnc 114 mg/dL  Lipid panel  Result Value Ref Range   Cholesterol, Total 167 100 - 199 mg/dL   Triglycerides 841 (H) 0 - 149 mg/dL   HDL 60 >32 mg/dL   VLDL  Cholesterol Cal 32 5 - 40 mg/dL   LDL Chol Calc (NIH) 75 0 - 99 mg/dL   Chol/HDL Ratio 2.8 0.0 - 4.4 ratio  Vitamin D (25 hydroxy)  Result Value Ref Range   Vit D, 25-Hydroxy 35.2 30.0 - 100.0 ng/mL  Lipoprotein A (LPA)  Result Value Ref Range   Lipoprotein (a) 131.6 (H) <75.0 nmol/L    Assessment & Plan    Routine Health Maintenance and Physical Exam  Exercise Activities and Dietary recommendations  Goals   None     Immunization History  Administered Date(s) Administered   Influenza Inj Mdck Quad Pf 04/23/2017   Influenza, Seasonal, Injecte, Preservative Fre 03/14/2023   Influenza,inj,Quad PF,6+ Mos 03/27/2019, 06/01/2021, 05/03/2022   Influenza-Unspecified 04/02/2020   Pneumococcal Polysaccharide-23 05/03/2005   Td 08/04/2002, 05/24/2020   Tdap 01/27/2010    Health Maintenance  Topic Date Due   COVID-19 Vaccine (1) Never done   Zoster Vaccines- Shingrix (1 of 2) Never done   Pneumococcal Vaccine 57-79 Years old (2 of 2 - PCV) 05/03/2006   Colonoscopy  08/30/2023   MAMMOGRAM  03/13/2024   Cervical Cancer Screening  (HPV/Pap Cotest)  06/10/2027   DTaP/Tdap/Td (4 - Td or Tdap) 05/24/2030   INFLUENZA VACCINE  Completed   Hepatitis C Screening  Completed   HIV Screening  Completed   HPV VACCINES  Aged Out    Discussed health benefits of physical activity, and encouraged her to engage in regular exercise appropriate for her age and condition.  Problem List Items Addressed This Visit       Cardiovascular and Mediastinum   Primary hypertension   Chronic, remains elevated Ongoing ortho pain Goal remains 119/79 Continue norvasc 10; Continue losartan 100      Relevant Orders   CBC with Differential/Platelet (Completed)   Comprehensive Metabolic Panel (CMET) (Completed)   TSH (Completed)     Musculoskeletal and Integument   AVN (avascular necrosis of bone) (HCC)   Chronic, L talus with use of chronic brace Plans for sx intervention; however, needs to recover from L knee replacement and ensure stable R knee replacement for chronic  Use of cane noted        Other   Annual physical exam - Primary   Things to do to keep yourself healthy  - Exercise at least 30-45 minutes a day, 3-4 days a week.  - Eat a low-fat diet with lots of fruits and vegetables, up to 7-9 servings per day.  - Seatbelts can save your life. Wear them always.  - Smoke detectors on every level of your home, check batteries every year.  - Eye Doctor - have an eye exam every 1-2 years  - Safe sex - if you may be exposed to STDs, use a condom.  - Alcohol -  If you drink, do it moderately, less than 2 drinks per day.  - Health Care Power of Attorney. Choose someone to speak for you if you are not able.  - Depression is common in our stressful world.If you're feeling down or losing interest in things you normally enjoy, please come in for a visit.  - Violence - If anyone is threatening or hurting you, please call immediately.       Breast cancer screening by mammogram   Due for screening for mammogram, denies breast concerns,  provided with phone number to call and schedule appointment for mammogram. Encouraged to repeat breast cancer screening every 1-2 years.       Chronic  pain of both knees   Chronic pain of left ankle   Family history of early CAD   Recommend LP(a) given chronic risk factors and known personal hx Already on statin  Denies chest pain or pressure       Relevant Orders   Lipoprotein A (LPA) (Completed)   Mixed hyperlipidemia   Chronic, previously elevated On crestor 20 Repeat LP The 10-year ASCVD risk score (Arnett DK, et al., 2019) is: 5.7%       Relevant Orders   Lipid panel (Completed)   Lipoprotein A (LPA) (Completed)   Morbid obesity (HCC)   Chronic, Body mass index is 38.77 kg/m. Associated with HTN, HLD, prediabetes Discussed importance of healthy weight management Discussed diet and exercise       Postmenopausal   Chronic, add vitamin labs to assist Consider DEXA going forward given chronic knee pain and L ankle AVN      Relevant Orders   Vitamin D (25 hydroxy) (Completed)   Prediabetes   Chronic, repeat A1c Continue to recommend balanced, lower carb meals. Smaller meal size, adding snacks. Choosing water as drink of choice and increasing purposeful exercise.       Relevant Orders   Hemoglobin A1c (Completed)   Screening for cervical cancer   Pt request for PAP; previous normal and negative HPV Denies recent sexual activity; however reports use of powder in lifetime as well as concern for multiple sexual partners      Relevant Orders   Cytology - PAP   Return in about 3 months (around 09/17/2023) for T2DM management.    Leilani Merl, FNP, have reviewed all documentation for this visit. The documentation on 06/21/23 for the exam, diagnosis, procedures, and orders are all accurate and complete.  Jacky Kindle, FNP  [facility] [phone] (phone) [phone] (fax)  [facility]

## 2023-06-21 NOTE — Assessment & Plan Note (Signed)
Chronic, previously elevated On crestor 20 Repeat LP The 10-year ASCVD risk score (Arnett DK, et al., 2019) is: 5.7%

## 2023-06-21 NOTE — Assessment & Plan Note (Signed)
Chronic, Body mass index is 38.77 kg/m. Associated with HTN, HLD, prediabetes Discussed importance of healthy weight management Discussed diet and exercise

## 2023-06-21 NOTE — Assessment & Plan Note (Signed)

## 2023-06-21 NOTE — Assessment & Plan Note (Signed)
Pt request for PAP; previous normal and negative HPV Denies recent sexual activity; however reports use of powder in lifetime as well as concern for multiple sexual partners

## 2023-06-21 NOTE — Patient Instructions (Signed)
The CDC recommends two doses of Shingrix (the new shingles vaccine) separated by 2 to 6 months for adults age 63 years and older. I recommend checking with your insurance plan regarding coverage for this vaccine.    

## 2023-06-21 NOTE — Assessment & Plan Note (Signed)
Chronic, repeat A1c Continue to recommend balanced, lower carb meals. Smaller meal size, adding snacks. Choosing water as drink of choice and increasing purposeful exercise.

## 2023-06-21 NOTE — Assessment & Plan Note (Signed)
Chronic, L talus with use of chronic brace Plans for sx intervention; however, needs to recover from L knee replacement and ensure stable R knee replacement for chronic  Use of cane noted

## 2023-07-24 ENCOUNTER — Other Ambulatory Visit: Payer: Self-pay | Admitting: Family Medicine

## 2023-07-24 DIAGNOSIS — I10 Essential (primary) hypertension: Secondary | ICD-10-CM

## 2023-07-24 MED ORDER — AMLODIPINE BESYLATE 10 MG PO TABS: 10.0000 mg | ORAL_TABLET | ORAL | 1 refills | Status: DC

## 2023-07-24 NOTE — Telephone Encounter (Signed)
Medication Refill -  Most Recent Primary Care Visit:  Provider: Merita Norton T  Department: [facility]  Visit Type: PHYSICAL  Date: 06/19/2023  Medication: amLODipine (NORVASC) 10 MG tablet   90 day supply  Has the patient contacted their pharmacy? Yes Yes, but was an Joseph pt.  Is this the correct pharmacy for this prescription? No If no, delete pharmacy and type the correct one.  This is the patient's preferred pharmacy:   [facility] [place], [place] - [address] [place] [address] Phone: [phone] Fax: [phone]   Has the prescription been filled recently? No  Is the patient out of the medication? No  Has the patient been seen for an appointment in the last year OR does the patient have an upcoming appointment? Yes  Can we respond through MyChart? Yes  Agent: Please be advised that Rx refills may take up to 3 business days. We ask that you follow-up with your pharmacy.

## 2023-07-24 NOTE — Telephone Encounter (Signed)
Requested Prescriptions  Pending Prescriptions Disp Refills   amLODipine (NORVASC) 10 MG tablet 90 tablet 1    Sig: Take 1 tablet (10 mg total) by mouth every morning. TAKE 1 TABLET(10 MG) BY MOUTH DAILY     Cardiovascular: Calcium Channel Blockers 2 Passed - 07/24/2023  5:16 PM      Passed - Last BP in normal range    BP Readings from Last 1 Encounters:  06/19/23 135/65         Passed - Last Heart Rate in normal range    Pulse Readings from Last 1 Encounters:  06/19/23 65         Passed - Valid encounter within last 6 months    Recent Outpatient Visits           1 month ago Annual physical exam   [facility] Jacky Kindle, FNP   5 months ago Morbid obesity [facility])   [facility] Jacky Kindle, FNP   1 year ago Annual physical exam   [facility] Jacky Kindle, FNP   1 year ago Morbid obesity [facility])   [facility] Jacky Kindle, FNP   2 years ago Annual physical exam   [facility] Jacky Kindle, FNP       Future Appointments             In 2 months Pardue, Monico Blitz, DO [place] [facility], [facility]

## 2023-07-26 ENCOUNTER — Other Ambulatory Visit: Payer: Self-pay | Admitting: Family Medicine

## 2023-07-26 DIAGNOSIS — I10 Essential (primary) hypertension: Secondary | ICD-10-CM

## 2023-07-26 MED ORDER — AMLODIPINE BESYLATE 10 MG PO TABS: 10.0000 mg | ORAL_TABLET | ORAL | 3 refills | Status: AC

## 2023-07-31 ENCOUNTER — Ambulatory Visit (INDEPENDENT_AMBULATORY_CARE_PROVIDER_SITE_OTHER): Payer: Managed Care, Other (non HMO) | Admitting: Dermatology

## 2023-07-31 ENCOUNTER — Encounter: Payer: Self-pay | Admitting: Dermatology

## 2023-07-31 DIAGNOSIS — D2371 Other benign neoplasm of skin of right lower limb, including hip: Secondary | ICD-10-CM

## 2023-07-31 DIAGNOSIS — W098XXA Fall on or from other playground equipment, initial encounter: Secondary | ICD-10-CM

## 2023-07-31 DIAGNOSIS — D2362 Other benign neoplasm of skin of left upper limb, including shoulder: Secondary | ICD-10-CM

## 2023-07-31 DIAGNOSIS — D1801 Hemangioma of skin and subcutaneous tissue: Secondary | ICD-10-CM

## 2023-07-31 DIAGNOSIS — Z1283 Encounter for screening for malignant neoplasm of skin: Secondary | ICD-10-CM | POA: Diagnosis not present

## 2023-07-31 DIAGNOSIS — D239 Other benign neoplasm of skin, unspecified: Secondary | ICD-10-CM

## 2023-07-31 DIAGNOSIS — L578 Other skin changes due to chronic exposure to nonionizing radiation: Secondary | ICD-10-CM

## 2023-07-31 DIAGNOSIS — L680 Hirsutism: Secondary | ICD-10-CM

## 2023-07-31 DIAGNOSIS — L570 Actinic keratosis: Secondary | ICD-10-CM | POA: Diagnosis not present

## 2023-07-31 DIAGNOSIS — D229 Melanocytic nevi, unspecified: Secondary | ICD-10-CM

## 2023-07-31 DIAGNOSIS — L814 Other melanin hyperpigmentation: Secondary | ICD-10-CM | POA: Diagnosis not present

## 2023-07-31 DIAGNOSIS — L821 Other seborrheic keratosis: Secondary | ICD-10-CM

## 2023-07-31 DIAGNOSIS — Z85828 Personal history of other malignant neoplasm of skin: Secondary | ICD-10-CM

## 2023-07-31 NOTE — Patient Instructions (Signed)

## 2023-07-31 NOTE — Progress Notes (Signed)
Follow-Up Visit   Subjective  Rita Foster is a 63 y.o. female who presents for the following: Skin Cancer Screening and Full Body Skin Exam, extensive history of sun exposure working in tobacco when younger, and tanning in the sun.  The patient presents for Total-Body Skin Exam (TBSE) for skin cancer screening and mole check. The patient has spots, moles and lesions to be evaluated, some may be new or changing and the patient may have concern these could be cancer.  The following portions of the chart were reviewed this encounter and updated as appropriate: medications, allergies, medical history  Review of Systems:  No other skin or systemic complaints except as noted in HPI or Assessment and Plan.  Objective  Well appearing patient in no apparent distress; mood and affect are within normal limits.  A full examination was performed including scalp, head, eyes, ears, nose, lips, neck, chest, axillae, abdomen, back, buttocks, bilateral upper extremities, bilateral lower extremities, hands, feet, fingers, toes, fingernails, and toenails. All findings within normal limits unless otherwise noted below.   Relevant physical exam findings are noted in the Assessment and Plan. R upper arm x 1 Erythematous thin papules/macules with gritty scale.   Assessment & Plan   SKIN CANCER SCREENING PERFORMED TODAY.  ACTINIC DAMAGE - Chronic condition, secondary to cumulative UV/sun exposure - diffuse scaly erythematous macules with underlying dyspigmentation - Recommend daily broad spectrum sunscreen SPF 30+ to sun-exposed areas, reapply every 2 hours as needed.  - Staying in the shade or wearing long sleeves, sun glasses (UVA+UVB protection) and wide brim hats (4-inch brim around the entire circumference of the hat) are also recommended for sun protection.  - Call for new or changing lesions. - any degree of tanning indicates DNA damage to skin and should be avoided  LENTIGINES, SEBORRHEIC  KERATOSES, HEMANGIOMAS - Benign normal skin lesions - Benign-appearing - Call for any changes  MELANOCYTIC NEVI - Tan-brown and/or pink-flesh-colored symmetric macules and papules - Benign appearing on exam today - Observation - Call clinic for new or changing moles - Recommend daily use of broad spectrum spf 30+ sunscreen to sun-exposed areas.   HISTORY OF BASAL CELL CARCINOMA OF THE SKIN - L nasal sidewall, tx with Mohs at [facility] by Dr. Lorn Junes on 03/19/2023 - No evidence of recurrence today - Recommend regular full body skin exams - Recommend daily broad spectrum sunscreen SPF 30+ to sun-exposed areas, reapply every 2 hours as needed.  - Call if any new or changing lesions are noted between office visits AK (ACTINIC KERATOSIS) R upper arm x 1 Actinic keratoses are precancerous spots that appear secondary to cumulative UV radiation exposure/sun exposure over time. They are chronic with expected duration over 1 year. A portion of actinic keratoses will progress to squamous cell carcinoma of the skin. It is not possible to reliably predict which spots will progress to skin cancer and so treatment is recommended to prevent development of skin cancer.  Recommend daily broad spectrum sunscreen SPF 30+ to sun-exposed areas, reapply every 2 hours as needed.  Recommend staying in the shade or wearing long sleeves, sun glasses (UVA+UVB protection) and wide brim hats (4-inch brim around the entire circumference of the hat). Call for new or changing lesions.  Destruction of lesion - R upper arm x 1 Complexity: simple   Destruction method: cryotherapy   Informed consent: discussed and consent obtained   Timeout:  patient name, date of birth, surgical site, and procedure verified Lesion destroyed using liquid nitrogen:  Yes   Region frozen until ice ball extended beyond lesion: Yes   Outcome: patient tolerated procedure well with no complications   Post-procedure details: wound care instructions  given   LENTIGINES   MULTIPLE BENIGN NEVI   ACTINIC ELASTOSIS   SEBORRHEIC KERATOSES   CHERRY ANGIOMA   DERMATOFIBROMA   HIRSUTISM    DERMATOFIBROMA - R pretibia and L posterior shoulder Exam: Firm pink/brown papulenodule with dimple sign. Treatment Plan: A dermatofibroma is a benign growth possibly related to trauma, such as an insect bite, cut from shaving, or inflamed acne-type bump.  Treatment options to remove include shave or excision with resulting scar and risk of recurrence.  Since benign-appearing and not bothersome, will observe for now.    Hirsutism - chin, face Benign and common. If bothersome, recommend following up with cosmetic dermatologist or Dr Roseanne Reno.  Return in about 1 year (around 07/30/2024) for TBSE.  Maylene Roes, CMA, am acting as scribe for Elie Goody, MD .  Documentation: I have reviewed the above documentation for accuracy and completeness, and I agree with the above.  Elie Goody, MD

## 2023-08-10 ENCOUNTER — Other Ambulatory Visit: Payer: Self-pay | Admitting: Family Medicine

## 2023-08-10 DIAGNOSIS — I10 Essential (primary) hypertension: Secondary | ICD-10-CM

## 2023-08-10 MED ORDER — LOSARTAN POTASSIUM 100 MG PO TABS: 100.0000 mg | ORAL_TABLET | Freq: Every morning | ORAL | 3 refills | Status: AC

## 2023-08-27 ENCOUNTER — Other Ambulatory Visit: Payer: Self-pay | Admitting: Family Medicine

## 2023-08-27 DIAGNOSIS — E7800 Pure hypercholesterolemia, unspecified: Secondary | ICD-10-CM

## 2023-08-27 DIAGNOSIS — G479 Sleep disorder, unspecified: Secondary | ICD-10-CM

## 2023-08-27 MED ORDER — TRAZODONE HCL 50 MG PO TABS: 50.0000 mg | ORAL_TABLET | Freq: Every evening | ORAL | 2 refills | Status: DC | PRN

## 2023-08-27 MED ORDER — ROSUVASTATIN CALCIUM 20 MG PO TABS: 20.0000 mg | ORAL_TABLET | Freq: Every day | ORAL | 3 refills | Status: DC

## 2023-10-15 ENCOUNTER — Ambulatory Visit: Payer: Self-pay | Admitting: Family Medicine

## 2023-10-15 ENCOUNTER — Encounter: Payer: Self-pay | Admitting: Family Medicine

## 2023-10-15 VITALS — BP 152/71 | HR 58 | Resp 16 | Ht 68.0 in | Wt 261.0 lb

## 2023-10-15 DIAGNOSIS — M8700 Idiopathic aseptic necrosis of unspecified bone: Secondary | ICD-10-CM

## 2023-10-15 DIAGNOSIS — E782 Mixed hyperlipidemia: Secondary | ICD-10-CM

## 2023-10-15 DIAGNOSIS — I10 Essential (primary) hypertension: Secondary | ICD-10-CM | POA: Diagnosis not present

## 2023-10-15 DIAGNOSIS — B009 Herpesviral infection, unspecified: Secondary | ICD-10-CM

## 2023-10-15 DIAGNOSIS — Z1212 Encounter for screening for malignant neoplasm of rectum: Secondary | ICD-10-CM

## 2023-10-15 DIAGNOSIS — E7841 Elevated Lipoprotein(a): Secondary | ICD-10-CM

## 2023-10-15 DIAGNOSIS — Z1211 Encounter for screening for malignant neoplasm of colon: Secondary | ICD-10-CM

## 2023-10-15 NOTE — Progress Notes (Signed)
 Established patient visit   Patient: Rita Foster   DOB: [birth year]   63 y.o. Female  MRN: [id number] Visit Date: 10/15/2023  Today's healthcare provider: Sherlyn Hay, DO   Chief Complaint  Patient presents with   Follow-up    3  month f/u / want labs ,    Subjective    HPI Rita Foster is a 63 year old female who presents for follow-up and to discuss discomfort during her last Pap smear.  She experienced significant discomfort during her last Pap smear, describing it as 'extraordinarily different' from previous experiences. Despite the use of lubricant, the procedure was painful, raising concerns about an underlying issue. She has been post-menopausal since age 20 and is worried that dryness may be contributing to the discomfort. She is hesitant to use hormonal treatments due to a family history of breast cancer after using a hormonal treatment. No sexual activity reported.  She is concerned about her weight, which she had to manage prior to knee surgery. She initially lost weight by reducing junk food and increasing physical activity but has since regained some weight. She attributes this to high sodium intake and reduced physical activity due to work commitments. She is attempting to cut down on sodium and increase exercise but finds it challenging. Her weight has increased from 255 lbs to 261 lbs since her last visit, with a reported low of 244 lbs prior to the last visit..  She has a history of knee replacements, with the right knee done in January of the previous year and the left knee more recently. She experiences some ankle pain, which she attributes to changes in her gait post-surgery. She plans to see a podiatrist for inserts.  She is concerned about her blood pressure, which has been elevated. She has been consuming salty snacks and is unsure if her reduced water intake is affecting her blood pressure.  She takes several medications including losartan,  amlodipine, rosuvastatin, aspirin, celecoxib, and acyclovir. She takes trazodone and Tylenol at night.       Medications: Outpatient Medications Prior to Visit  Medication Sig   aspirin EC 81 MG tablet Take 81 mg by mouth daily. Swallow whole.   celecoxib (CELEBREX) 200 MG capsule Take 200 mg by mouth daily.   acyclovir (ZOVIRAX) 400 MG tablet Take 1 tablet (400 mg total) by mouth every morning.   amLODipine (NORVASC) 10 MG tablet Take 1 tablet (10 mg total) by mouth every morning. TAKE 1 TABLET(10 MG) BY MOUTH DAILY   Blood Pressure Monitoring (BLOOD PRESSURE MONITOR/WRIST) DEVI To check bp daily   diclofenac Sodium (VOLTAREN) 1 % GEL Apply 2 g topically in the morning and at bedtime.   losartan (COZAAR) 100 MG tablet Take 1 tablet (100 mg total) by mouth every morning.   rosuvastatin (CRESTOR) 20 MG tablet Take 1 tablet (20 mg total) by mouth daily.   traZODone (DESYREL) 50 MG tablet Take 1 tablet (50 mg total) by mouth at bedtime as needed for sleep.   [DISCONTINUED] acetaminophen (TYLENOL 8 HOUR ARTHRITIS PAIN) 650 MG CR tablet Take 1,300 mg by mouth 2 (two) times daily. (Patient not taking: Reported on 10/15/2023)   [DISCONTINUED] aspirin EC 81 MG tablet Take 1 tablet (81 mg total) by mouth 2 (two) times daily. Swallow whole. (Patient not taking: Reported on 10/15/2023)   [DISCONTINUED] celecoxib (CELEBREX) 200 MG capsule Take 1 capsule (200 mg total) by mouth 2 (two) times daily. (Patient not taking:  Reported on 10/15/2023)   [DISCONTINUED] diazepam (VALIUM) 5 MG tablet Take 5 mg by mouth daily as needed for anxiety. (Patient not taking: Reported on 10/15/2023)   [DISCONTINUED] Multiple Vitamin tablet Take 1 tablet by mouth daily.  (Patient not taking: Reported on 10/15/2023)   [DISCONTINUED] oxyCODONE (OXY IR/ROXICODONE) 5 MG immediate release tablet Take 1 tablet (5 mg total) by mouth every 4 (four) hours as needed for moderate pain (pain score 4-6) (pain score 4-6).   [DISCONTINUED]  traMADol (ULTRAM) 50 MG tablet Take 1-2 tablets (50-100 mg total) by mouth every 4 (four) hours as needed for moderate pain (pain score 4-6).   No facility-administered medications prior to visit.        Objective    BP (!) 152/71 (BP Location: Left Arm, Patient Position: Sitting, Cuff Size: Large)   Pulse (!) 58   Resp 16   Ht 5\' 8"  (1.727 m)   Wt 261 lb (118.4 kg)   SpO2 100%   BMI 39.68 kg/m     Physical Exam Vitals and nursing note reviewed.  Constitutional:      General: She is not in acute distress.    Appearance: Normal appearance.  HENT:     Head: Normocephalic and atraumatic.  Eyes:     General: No scleral icterus.    Conjunctiva/sclera: Conjunctivae normal.  Cardiovascular:     Rate and Rhythm: Normal rate.  Pulmonary:     Effort: Pulmonary effort is normal.  Neurological:     Mental Status: She is alert and oriented to person, place, and time. Mental status is at baseline.  Psychiatric:        Mood and Affect: Mood normal.        Behavior: Behavior normal.      No results found for any visits on 10/15/23.  Assessment & Plan    Mixed hyperlipidemia -     Lipid panel  Herpes  Primary hypertension  AVN (avascular necrosis of bone) (HCC) Assessment & Plan: Chronic, L talus with use of chronic brace; pain aggravated by correcting alignment status post knee replacements.  Now using a brace on her right ankle as well. Plans for sx intervention; however, still recovering from L knee replacement (04/2023); R knee replacement done 07/2022. Use of cane noted.   Elevated lipoprotein(a) -     Lipoprotein A (LPA)  Morbid obesity (HCC)  Encounter for colorectal cancer screening -     Ambulatory referral to Gastroenterology     Hypertension Elevated blood pressure potentially due to increased salt intake and weight gain. Currently on multiple antihypertensive medications. Encouraged increased physical activity and dietary modifications. - Encourage  reduction of salt intake. - Increase physical activity, including using an exercise bike. - Consider adding another antihypertensive medication if blood pressure remains elevated.  Obesity Weight gain may contribute to elevated blood pressure. Emphasized importance of consistent exercise and dietary modifications. - Encourage continued dietary modifications and increased physical activity. - Monitor weight and adjust plan as needed.  Hyperlipidemia Elevated lipoprotein A and triglycerides, possibly influenced by dietary habits. Family history of cardiovascular disease necessitates aggressive cholesterol management. Emphasized importance of dietary modifications and monitoring lipid levels. - Order lipid panel to recheck triglyceride levels.  Will reach check lipoprotein A. - Encourage dietary modifications to reduce triglycerides.  Dyspareunia Pain during Pap smears likely due to vaginal dryness and possibly speculum size (unknown what size was used). Postmenopausal status contributes to dryness.  Discussed that lack of sexual activity does not necessarily  contribute to dryness.  Concerned about hormonal treatments due to family history of breast cancer. Non-hormonal lubricants are an alternative. - Consider using a smaller speculum during Pap smears. - Discuss non-hormonal lubricants for vaginal dryness.  General Health Maintenance Due for a colonoscopy and interested in vaccines for COVID, shingles, and pneumonia. Prefers less intensive colonoscopy preparation. - Schedule colonoscopy with preferred preparation method. - Discuss vaccines for COVID, shingles, and pneumonia.  Patient declines today.   Return in about 18 weeks (around 02/18/2024) for HTN.      I discussed the assessment and treatment plan with the patient  The patient was provided an opportunity to ask questions and all were answered. The patient agreed with the plan and demonstrated an understanding of the instructions.    The patient was advised to call back or seek an in-person evaluation if the symptoms worsen or if the condition fails to improve as anticipated.    Carlean Charter, DO  [facility] [phone] (phone) [phone] (fax)  [facility]

## 2023-10-15 NOTE — Assessment & Plan Note (Addendum)
 Chronic, L talus with use of chronic brace; pain aggravated by correcting alignment status post knee replacements.  Now using a brace on her right ankle as well. Plans for sx intervention; however, still recovering from L knee replacement (04/2023); R knee replacement done 07/2022. Use of cane noted.

## 2023-10-16 LAB — LIPID PANEL
Chol/HDL Ratio: 2 ratio (ref 0.0–4.4)
Cholesterol, Total: 152 mg/dL (ref 100–199)
HDL: 77 mg/dL (ref >39)
LDL Chol Calc (NIH): 63 mg/dL (ref 0–99)
Triglycerides: 61 mg/dL (ref 0–149)
VLDL Cholesterol Cal: 12 mg/dL (ref 5–40)

## 2023-10-16 LAB — LIPOPROTEIN A (LPA): Lipoprotein (a): 146.7 nmol/L — ABNORMAL HIGH (ref <75.0)

## 2023-10-25 ENCOUNTER — Encounter: Payer: Self-pay | Admitting: Family Medicine

## 2023-10-25 ENCOUNTER — Other Ambulatory Visit: Payer: Self-pay | Admitting: Family Medicine

## 2023-10-25 DIAGNOSIS — B009 Herpesviral infection, unspecified: Secondary | ICD-10-CM

## 2023-10-25 NOTE — Telephone Encounter (Signed)
 Copied from CRM [id number]. Topic: Clinical - Medication Refill >> Oct 25, 2023  1:48 PM Antwanette L wrote: Most Recent Primary Care Visit:  Provider: Carlean Charter  Department: [facility]  Visit Type: OFFICE VISIT  Date: 10/15/2023  Medication: acyclovir  (ZOVIRAX ) 400 MG tablet   Has the patient contacted their pharmacy? Yes   Is this the correct pharmacy for this prescription? Yes  [facility] - [place], [place] - [address] [place] [address] Phone: [phone] Fax: [phone]   Has the prescription been filled recently? No.Last refilled on 09/20/22  Is the patient out of the medication? No. Patient has 5 days left  Has the patient been seen for an appointment in the last year OR does the patient have an upcoming appointment? Yes  Can we respond through MyChart? No. Contact patient by phone at [phone]  Agent: Please be advised that Rx refills may take up to 3 business days. We ask that you follow-up with your pharmacy.

## 2023-10-26 MED ORDER — ACYCLOVIR 400 MG PO TABS: 400.0000 mg | ORAL_TABLET | ORAL | 1 refills | Status: DC

## 2023-10-26 NOTE — Telephone Encounter (Signed)
 Requested Prescriptions  Pending Prescriptions Disp Refills   acyclovir  (ZOVIRAX ) 400 MG tablet 90 tablet 1    Sig: Take 1 tablet (400 mg total) by mouth every morning.     Antimicrobials:  Antiviral Agents - Anti-Herpetic Passed - 10/26/2023 11:19 AM      Passed - Valid encounter within last 12 months    Recent Outpatient Visits           1 week ago Mixed hyperlipidemia   [facility] [place], Asencion Blacksmith, DO

## 2024-01-24 ENCOUNTER — Telehealth: Payer: Self-pay

## 2024-01-24 NOTE — Telephone Encounter (Signed)
 Copied from CRM #[id number]. Topic: Clinical - Medication Question >> Jan 24, 2024  2:56 PM Rita Foster wrote: Reason for CRM: Patient asking if she can take her bp meds at night instead of during the day, [phone]

## 2024-02-15 ENCOUNTER — Other Ambulatory Visit: Payer: Self-pay | Admitting: Family Medicine

## 2024-02-15 DIAGNOSIS — Z1231 Encounter for screening mammogram for malignant neoplasm of breast: Secondary | ICD-10-CM

## 2024-02-18 ENCOUNTER — Encounter: Payer: Self-pay | Admitting: Family Medicine

## 2024-02-18 ENCOUNTER — Ambulatory Visit: Admitting: Family Medicine

## 2024-02-18 VITALS — BP 134/79 | HR 57 | Temp 98.4°F | Ht 68.0 in | Wt 262.7 lb

## 2024-02-18 DIAGNOSIS — I10 Essential (primary) hypertension: Secondary | ICD-10-CM | POA: Diagnosis not present

## 2024-02-18 DIAGNOSIS — R7303 Prediabetes: Secondary | ICD-10-CM

## 2024-02-18 DIAGNOSIS — Z713 Dietary counseling and surveillance: Secondary | ICD-10-CM

## 2024-02-18 DIAGNOSIS — E782 Mixed hyperlipidemia: Secondary | ICD-10-CM | POA: Diagnosis not present

## 2024-02-18 DIAGNOSIS — M8700 Idiopathic aseptic necrosis of unspecified bone: Secondary | ICD-10-CM

## 2024-02-18 NOTE — Assessment & Plan Note (Addendum)
 Chronic, L talus with use of chronic brace; pain aggravated by correcting alignment status post knee replacements.   Plans for possible sx intervention on ankle.  L knee replacement (04/2023); R knee replacement done 07/2022. Use of cane noted.

## 2024-02-18 NOTE — Progress Notes (Signed)
 Established patient visit   Patient: Rita Foster   DOB: 07/08/1960   63 y.o. Female  MRN: [id number] Visit Date: 02/18/2024  Today's healthcare provider: LAURAINE LOISE BUOY, DO   Chief Complaint  Patient presents with   Medical Management of Chronic Issues    Patient presents for follow up of htn.  Needs form filled for work, needing fasting lab work  Would like to come off rosuvastatin , states she has read things that show concern with memory loss. States sometimes she will experience this, will be talking and forget the words she is saying.  Declines prevnar and shingrix at this time. Patient has had consultation for colonoscopy and is awaiting scheduling for it.   Subjective    HPI Last annual exam: 06/19/2023   Rita Foster is a 63 year old female with hypertension who presents for follow-up on blood pressure and blood work.  She is experiencing memory issues, such as forgetting words and tasks, which she associates with her use of rosuvastatin . She is concerned about potential cognitive side effects of statins and is unsure if these issues are due to the medication, stress from work, or aging. She has been on rosuvastatin  since it was prescribed by a previous provider in 2023 due to her high LDL cholesterol levels, although her HDL cholesterol was noted to be in a good range.  Her family history includes her father, who had reported early cardiovascular disease and underwent renal bypass surgery after an ophthalmologist recommended further evaluation. Her family history includes her father, who also had a history of carotid artery blockage and peripheral arterial disease though these were in the last few years of his life. He passed away at the age of 34 after a series of health issues, including a possible stroke and osteomyelitis.  She is experiencing significant work-related stress, working a job and a half with minimal time off. She has not taken her earned  vacation time and has been unable to keep up with scheduling and attending personal health appointments due to her workload. She acknowledges that this stress may be contributing to her memory issues.  She has had both knees replaced and is considering an ankle replacement. Her physical activity is limited due to work demands and fatigue. She attempts to exercise by riding a stationary bike but struggles to maintain a consistent routine.  Her diet includes scrambled eggs with broccoli, low-carb tortillas with black beans and salsa, and grilled salmon. She has recently discovered Talenti ice cream and admits to occasionally overindulging. She drinks a lot of water  and has reduced her sodium intake, but she is not losing weight as expected.      Medications: Outpatient Medications Prior to Visit  Medication Sig   acyclovir  (ZOVIRAX ) 400 MG tablet Take 1 tablet (400 mg total) by mouth every morning.   amLODipine  (NORVASC ) 10 MG tablet Take 1 tablet (10 mg total) by mouth every morning. TAKE 1 TABLET(10 MG) BY MOUTH DAILY   amoxicillin (AMOXIL) 500 MG capsule Take 2,000 mg by mouth daily.   aspirin  EC 81 MG tablet Take 81 mg by mouth daily. Swallow whole.   Blood Pressure Monitoring (BLOOD PRESSURE MONITOR/WRIST) DEVI To check bp daily   diclofenac  Sodium (VOLTAREN ) 1 % GEL Apply 2 g topically in the morning and at bedtime.   losartan  (COZAAR ) 100 MG tablet Take 1 tablet (100 mg total) by mouth every morning.   rosuvastatin  (CRESTOR ) 20 MG tablet Take 1 tablet (  20 mg total) by mouth daily.   traZODone  (DESYREL ) 50 MG tablet Take 1 tablet (50 mg total) by mouth at bedtime as needed for sleep.   [DISCONTINUED] celecoxib  (CELEBREX ) 200 MG capsule Take 200 mg by mouth daily.   No facility-administered medications prior to visit.        Objective    BP 134/79 (BP Location: Left Arm, Patient Position: Sitting, Cuff Size: Normal)   Pulse (!) 57   Temp 98.4 F (36.9 C) (Oral)   Ht 5' 8 (1.727  m)   Wt 262 lb 11.2 oz (119.2 kg)   SpO2 97%   BMI 39.94 kg/m     Physical Exam Constitutional:      Appearance: Normal appearance.  HENT:     Head: Normocephalic and atraumatic.  Eyes:     General: No scleral icterus.    Extraocular Movements: Extraocular movements intact.     Conjunctiva/sclera: Conjunctivae normal.  Cardiovascular:     Rate and Rhythm: Normal rate and regular rhythm.     Pulses: Normal pulses.     Heart sounds: Normal heart sounds.  Pulmonary:     Effort: Pulmonary effort is normal. No respiratory distress.     Breath sounds: Normal breath sounds.  Abdominal:     General: There is no distension.  Musculoskeletal:     Right lower leg: No edema.     Left lower leg: No edema.     Comments: Splint to left ankle  Skin:    General: Skin is warm and dry.  Neurological:     Mental Status: She is alert and oriented to person, place, and time. Mental status is at baseline.  Psychiatric:        Mood and Affect: Mood normal.        Behavior: Behavior normal.      No results found for any visits on 02/18/24.  Assessment & Plan    Primary hypertension -     Comprehensive metabolic panel with GFR  Mixed hyperlipidemia -     Lipid Panel With LDL/HDL Ratio  Prediabetes -     Hemoglobin A1c  Morbid obesity (HCC)  Weight loss counseling, encounter for  AVN (avascular necrosis of bone) (HCC) Assessment & Plan: Chronic, L talus with use of chronic brace; pain aggravated by correcting alignment status post knee replacements.   Plans for possible sx intervention on ankle.  L knee replacement (04/2023); R knee replacement done 07/2022. Use of cane noted.       Hypertension Chronic, stable.  Well-controlled on losartan  100 mg daily and amlodipine  10 mg daily. No acute concerns.  Continue to monitor.    Hyperlipidemia with possible statin-associated cognitive symptoms Discussed potential statin-associated cognitive symptoms, noting some forms of  dementia associated with statin use are reversible upon discontinuation, though most are not proven to be causally-linked. Addressed concerns about stroke risk and weight gain, clarifying statins reduce inflammation and weight gain might be indirectly related to fatigue. - Discontinue rosuvastatin  for four weeks to assess changes in cognitive symptoms. - Re-evaluate cholesterol levels today and after four weeks of discontinuation. - Discuss results and symptoms at follow-up visit. - Consider obtaining calcium  cardiac CT score.  Morbid obesity; weight loss counseling Difficulty with exercising due to joint replacements and ankle arthritis Emphasized importance of regular exercise for weight management and overall health. Discussed potential benefits of weight loss and exercise in managing condition. - Set alarms to remind her to take breaks for exercise. - Continue  monitoring and adjusting diet and exercise habits.    Return in about 4 months (around 06/20/2024) for Pap, CPE.      I discussed the assessment and treatment plan with the patient  The patient was provided an opportunity to ask questions and all were answered. The patient agreed with the plan and demonstrated an understanding of the instructions.   The patient was advised to call back or seek an in-person evaluation if the symptoms worsen or if the condition fails to improve as anticipated.    Rita LOISE BUOY, DO  [facility] [phone] (phone) [phone] (fax)  [facility]

## 2024-02-19 ENCOUNTER — Telehealth: Payer: Self-pay

## 2024-02-19 LAB — LIPID PANEL WITH LDL/HDL RATIO
Cholesterol, Total: 147 mg/dL (ref 100–199)
HDL: 64 mg/dL (ref >39)
LDL Chol Calc (NIH): 71 mg/dL (ref 0–99)
LDL/HDL Ratio: 1.1 ratio (ref 0.0–3.2)
Triglycerides: 56 mg/dL (ref 0–149)
VLDL Cholesterol Cal: 12 mg/dL (ref 5–40)

## 2024-02-19 LAB — COMPREHENSIVE METABOLIC PANEL WITH GFR
ALT: 21 IU/L (ref 0–32)
AST: 26 IU/L (ref 0–40)
Albumin: 4.3 g/dL (ref 3.9–4.9)
Alkaline Phosphatase: 78 IU/L (ref 44–121)
BUN/Creatinine Ratio: 15 (ref 12–28)
BUN: 9 mg/dL (ref 8–27)
Bilirubin Total: 0.4 mg/dL (ref 0.0–1.2)
CO2: 23 mmol/L (ref 20–29)
Calcium: 9.5 mg/dL (ref 8.7–10.3)
Chloride: 104 mmol/L (ref 96–106)
Creatinine, Ser: 0.61 mg/dL (ref 0.57–1.00)
Globulin, Total: 2.5 g/dL (ref 1.5–4.5)
Glucose: 91 mg/dL (ref 70–99)
Potassium: 4.5 mmol/L (ref 3.5–5.2)
Sodium: 140 mmol/L (ref 134–144)
Total Protein: 6.8 g/dL (ref 6.0–8.5)
eGFR: 100 mL/min/1.73 (ref >59)

## 2024-02-19 LAB — HEMOGLOBIN A1C
Est. average glucose Bld gHb Est-mCnc: 117 mg/dL
Hgb A1c MFr Bld: 5.7 % — ABNORMAL HIGH (ref 4.8–5.6)

## 2024-02-19 NOTE — Telephone Encounter (Signed)
 Spoke with patient as well as Dr. Donzella. Added on lipoprotein A test through [facility]

## 2024-02-19 NOTE — Telephone Encounter (Signed)
 Copied from CRM [id number]. Topic: Clinical - Medical Advice >> Feb 19, 2024  1:59 PM Sasha H wrote: Reason for CRM: Pt called in with questions about her recent lab results and has questions. Pt would like a call back, pt states she googled and one of the things she found states that the results can be swayed by thyroid issues, which run on both sides of her family. Pt states she does not think she fits the bill for the elevated lab results , pt is wanting to know if a thyroid test can be done. Pt would like a call back

## 2024-02-19 NOTE — Telephone Encounter (Signed)
 Results have not been reviewed by provider.  Mychart message has been sent to the patient to let her know

## 2024-02-25 ENCOUNTER — Ambulatory Visit: Payer: Self-pay | Admitting: Family Medicine

## 2024-02-25 DIAGNOSIS — E7841 Elevated Lipoprotein(a): Secondary | ICD-10-CM

## 2024-02-26 LAB — LIPOPROTEIN A (LPA): Lipoprotein (a): 153.6 nmol/L — ABNORMAL HIGH (ref <75.0)

## 2024-02-26 LAB — SPECIMEN STATUS REPORT

## 2024-03-14 ENCOUNTER — Ambulatory Visit
Admission: RE | Admit: 2024-03-14 | Discharge: 2024-03-14 | Disposition: A | Discharge status: Home / Self Care | Source: Ambulatory Visit | Attending: Family Medicine | Admitting: Family Medicine

## 2024-03-14 DIAGNOSIS — Z1231 Encounter for screening mammogram for malignant neoplasm of breast: Secondary | ICD-10-CM | POA: Insufficient documentation

## 2024-03-19 ENCOUNTER — Telehealth: Payer: Self-pay

## 2024-03-19 NOTE — Telephone Encounter (Signed)
 Copied from CRM [id number]. Topic: General - Other >> Mar 19, 2024  1:21 PM Santiya F wrote: Reason for CRM: Patient is calling in because she would like to have a flu shot, but she wants to know if it is a single dose without thimerosal. Patient says by single dose, she means one vial per person as opposed to using the same vial for multiple people. Please advise.

## 2024-03-21 ENCOUNTER — Telehealth: Payer: Self-pay

## 2024-03-21 NOTE — Telephone Encounter (Signed)
 Copied from CRM [id number]. Topic: General - Other >> Mar 19, 2024  1:21 PM Santiya F wrote: Reason for CRM: Patient is calling in because she would like to have a flu shot, but she wants to know if it is a single dose without thimerosal. Patient says by single dose, she means one vial per person as opposed to using the same vial for multiple people. Please advise. >> Mar 21, 2024  1:02 PM Gustabo D wrote: Pt never got a call back about her flu shot question.

## 2024-03-24 NOTE — Telephone Encounter (Signed)
 Message went to patient via mychart by provider

## 2024-03-25 ENCOUNTER — Ambulatory Visit

## 2024-03-27 ENCOUNTER — Ambulatory Visit (INDEPENDENT_AMBULATORY_CARE_PROVIDER_SITE_OTHER)

## 2024-03-27 DIAGNOSIS — Z23 Encounter for immunization: Secondary | ICD-10-CM | POA: Diagnosis not present

## 2024-03-28 ENCOUNTER — Ambulatory Visit: Payer: Self-pay | Admitting: Family Medicine

## 2024-04-04 ENCOUNTER — Other Ambulatory Visit: Payer: Self-pay

## 2024-04-04 ENCOUNTER — Telehealth: Payer: Self-pay

## 2024-04-04 DIAGNOSIS — B009 Herpesviral infection, unspecified: Secondary | ICD-10-CM

## 2024-04-04 NOTE — Telephone Encounter (Signed)
-   pt is out of refills and needs a new prescription for a 3 month supply [facility]

## 2024-04-04 NOTE — Telephone Encounter (Signed)
 Converted into a refill request

## 2024-04-04 NOTE — Telephone Encounter (Signed)
 Copied from CRM [id number]. Topic: Clinical - Prescription Issue >> Apr 04, 2024  2:14 PM Uyopbjy D wrote: acyclovir  (ZOVIRAX ) 400 MG tablet  - pt is out of refills and needs a new prescription for a 3 month supply [facility]

## 2024-04-05 MED ORDER — ACYCLOVIR 400 MG PO TABS: 400.0000 mg | ORAL_TABLET | ORAL | 1 refills | Status: AC

## 2024-04-07 NOTE — Progress Notes (Unsigned)
 Cardiology Office Note   Date:  04/08/2024  ID:  Rita Foster, DOB 1961-01-27, MRN [id number] PCP: Rita Lauraine SAILOR, DO  [facility] Providers Cardiologist:  Caron Poser, MD     History of Present Illness Rita Foster is a 63 y.o. female PMH HTN, HLD who presents for further evaluation and management of elevated LPA.  Patient reports that she is overall feeling well.  She denies any exertional symptoms such as chest discomfort or dyspnea.  She denies any other high risk symptoms such as palpitations or syncope.  No orthopnea or LE edema.  Last LDL 71 02/2024.  LPA 154 02/2024.  She does note a family history of CHF in her father.  Relevant CVD History -None   ROS: Pt denies any chest discomfort, jaw pain, arm pain, palpitations, syncope, presyncope, orthopnea, PND, or LE edema.  Studies Reviewed I have independently reviewed the patient's ECG, previous medical records, and recent blood work.  Physical Exam VS:  BP 126/80 (BP Location: Right Arm, Patient Position: Sitting, Cuff Size: Normal)   Pulse (!) 58   Ht 5' 8.5 (1.74 m)   Wt 264 lb (119.7 kg)   SpO2 97%   BMI 39.56 kg/m        Wt Readings from Last 3 Encounters:  04/08/24 264 lb (119.7 kg)  02/18/24 262 lb 11.2 oz (119.2 kg)  10/15/23 261 lb (118.4 kg)    GEN: No acute distress. NECK: No JVD; No carotid bruits. CARDIAC: RRR, no murmurs, rubs, gallops. RESPIRATORY:  Clear to auscultation. EXTREMITIES:  Warm and well-perfused. No edema.  ASSESSMENT AND PLAN HLD Elevated LPA Cardiac risk counseling Patient presents with asymptomatic findings of an elevated LPA.  She has been on Crestor  20 mg daily and this seems to have achieved good control of her LDL.  We discussed that in the current state, elevated LPA is simply a marker of risk and we do not have a great treatment strategy to directly address this, though there are some agents in development.  We discussed shooting for a LDL goal of less  than 55 as well as continuing baby aspirin  to her regimen in order to try to mitigate this risk.  Since she is otherwise asymptomatic, no further testing is indicated.  We do not need to obtain a CAC score since she is already taking an aspirin  and statin.  Plan: - Continue ASA 81 mg daily - Increase Crestor  to 40 mg daily; LDL goal less than 55 given risk enhancer of elevated LPA.  Can add Zetia to her regimen if the increased dose of Crestor  does not get her to goal. Would not continue checking LPA since it will continue to be elevated until a treatment is developed to directly lower it - Since she is asymptomatic, no further testing is needed.  If she develops any concerning symptoms (e.g., chest discomfort, dyspnea, etc.), then we should further stratify her with a coronary CT angiogram and echocardiogram - In the meantime, we will want to make sure that her other modifiable risk factors are fully optimized (hypertension, diabetes, no smoking, etc.) - Briefly discussed Ozempic and similar weight loss medications; she is not currently interested  HTN LVH by ECG Blood pressure seems relatively well-controlled today.  She has mild LVH on her ECG, but is otherwise feeling well.  Likely due to longstanding hypertension.  As above, in the absence of symptoms, can withhold further testing since it will not change management as she is already  being treated for hypertension.       Dispo: RTC as needed  Signed, Caron Poser, MD

## 2024-04-08 ENCOUNTER — Ambulatory Visit

## 2024-04-08 VITALS — BP 126/80 | HR 58 | Ht 68.5 in | Wt 264.0 lb

## 2024-04-08 DIAGNOSIS — Z7189 Other specified counseling: Secondary | ICD-10-CM | POA: Diagnosis not present

## 2024-04-08 DIAGNOSIS — E7841 Elevated Lipoprotein(a): Secondary | ICD-10-CM | POA: Diagnosis not present

## 2024-04-08 DIAGNOSIS — E7800 Pure hypercholesterolemia, unspecified: Secondary | ICD-10-CM

## 2024-04-08 DIAGNOSIS — I517 Cardiomegaly: Secondary | ICD-10-CM | POA: Diagnosis not present

## 2024-04-08 DIAGNOSIS — E782 Mixed hyperlipidemia: Secondary | ICD-10-CM

## 2024-04-08 DIAGNOSIS — I10 Essential (primary) hypertension: Secondary | ICD-10-CM

## 2024-04-08 MED ORDER — ROSUVASTATIN CALCIUM 40 MG PO TABS: 40.0000 mg | ORAL_TABLET | Freq: Every day | ORAL | 3 refills | Status: AC

## 2024-04-08 NOTE — Patient Instructions (Signed)
 Medication Instructions:  Your physician recommends the following medication changes.  INCREASE: Rosuvastatin  (CRESTOR ) 20 mg to 40 mg by mouth once daily  *If you need a refill on your cardiac medications before your next appointment, please call your pharmacy*  Lab Work: No labs ordered today  If you have labs (blood work) drawn today and your tests are completely normal, you will receive your results only by: MyChart Message (if you have MyChart) OR A paper copy in the mail If you have any lab test that is abnormal or we need to change your treatment, we will call you to review the results.  Testing/Procedures: No test ordered today   Follow-Up: At [facility] you and your health needs are our priority.  As part of our continuing mission to provide you with exceptional heart care, our providers are all part of one team.  This team includes your primary Cardiologist (physician) and Advanced Practice Providers or APPs (Physician Assistants and Nurse Practitioners) who all work together to provide you with the care you need, when you need it.  Your next appointment:   As Needed   Provider:   You may see Caron Poser, MD or one of the following Advanced Practice Providers on your designated Care Team:   Lonni Meager, NP Lesley Maffucci, PA-C Bernardino Bring, PA-C Cadence Summerset, PA-C Tylene Lunch, NP Barnie Hila, NP   We recommend signing up for the patient portal called MyChart.  Sign up information is provided on this After Visit Summary.  MyChart is used to connect with patients for Virtual Visits (Telemedicine).  Patients are able to view lab/test results, encounter notes, upcoming appointments, etc.  Non-urgent messages can be sent to your provider as well.   To learn more about what you can do with MyChart, go to [email or web address].

## 2024-06-10 ENCOUNTER — Ambulatory Visit

## 2024-06-11 ENCOUNTER — Ambulatory Visit: Admitting: Dermatology

## 2024-06-11 DIAGNOSIS — W908XXA Exposure to other nonionizing radiation, initial encounter: Secondary | ICD-10-CM

## 2024-06-11 DIAGNOSIS — L700 Acne vulgaris: Secondary | ICD-10-CM

## 2024-06-11 DIAGNOSIS — L821 Other seborrheic keratosis: Secondary | ICD-10-CM | POA: Diagnosis not present

## 2024-06-11 DIAGNOSIS — L813 Cafe au lait spots: Secondary | ICD-10-CM | POA: Diagnosis not present

## 2024-06-11 DIAGNOSIS — D229 Melanocytic nevi, unspecified: Secondary | ICD-10-CM

## 2024-06-11 DIAGNOSIS — D2362 Other benign neoplasm of skin of left upper limb, including shoulder: Secondary | ICD-10-CM | POA: Diagnosis not present

## 2024-06-11 DIAGNOSIS — Z1283 Encounter for screening for malignant neoplasm of skin: Secondary | ICD-10-CM

## 2024-06-11 DIAGNOSIS — L578 Other skin changes due to chronic exposure to nonionizing radiation: Secondary | ICD-10-CM

## 2024-06-11 DIAGNOSIS — D2239 Melanocytic nevi of other parts of face: Secondary | ICD-10-CM | POA: Diagnosis not present

## 2024-06-11 DIAGNOSIS — L814 Other melanin hyperpigmentation: Secondary | ICD-10-CM

## 2024-06-11 DIAGNOSIS — L738 Other specified follicular disorders: Secondary | ICD-10-CM | POA: Diagnosis not present

## 2024-06-11 DIAGNOSIS — S0081XA Abrasion of other part of head, initial encounter: Secondary | ICD-10-CM | POA: Diagnosis not present

## 2024-06-11 DIAGNOSIS — L988 Other specified disorders of the skin and subcutaneous tissue: Secondary | ICD-10-CM

## 2024-06-11 DIAGNOSIS — D239 Other benign neoplasm of skin, unspecified: Secondary | ICD-10-CM

## 2024-06-11 DIAGNOSIS — Z872 Personal history of diseases of the skin and subcutaneous tissue: Secondary | ICD-10-CM

## 2024-06-11 DIAGNOSIS — D1801 Hemangioma of skin and subcutaneous tissue: Secondary | ICD-10-CM

## 2024-06-11 DIAGNOSIS — Z85828 Personal history of other malignant neoplasm of skin: Secondary | ICD-10-CM

## 2024-06-11 MED ORDER — TRETINOIN 0.025 % EX CREA: TOPICAL_CREAM | CUTANEOUS | 3 refills | Status: AC

## 2024-06-11 NOTE — Progress Notes (Signed)
 Follow-Up Visit   Subjective  Rita Foster is a 63 y.o. female who presents for the following: Skin Cancer Screening and Full Body Skin Exam  The patient presents for Total-Body Skin Exam (TBSE) for skin cancer screening and mole check. The patient has spots, moles and lesions to be evaluated, some may be new or changing. She has scaly areas on the upper back that she can feel. History of BCC of the left nasal sidewall, Mohs 03/19/2023.    The following portions of the chart were reviewed this encounter and updated as appropriate: medications, allergies, medical history  Review of Systems:  No other skin or systemic complaints except as noted in HPI or Assessment and Plan.  Objective  Well appearing patient in no apparent distress; mood and affect are within normal limits.  A full examination was performed including scalp, head, eyes, ears, nose, lips, neck, chest, axillae, abdomen, back, buttocks, bilateral upper extremities, bilateral lower extremities, hands, feet, fingers, toes, fingernails, and toenails. All findings within normal limits unless otherwise noted below.   Relevant physical exam findings are noted in the Assessment and Plan.    Assessment & Plan   SKIN CANCER SCREENING PERFORMED TODAY.  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 Gregorio. - No evidence of recurrence today - Recommend regular full body skin exams - Recommend daily broad spectrum sunscreen SPF 30+ to sun-exposed areas, reapply every 2 hours as needed.  - Call if any new or changing lesions are noted between office visits  ACTINIC DAMAGE - Chronic condition, secondary to cumulative UV/sun exposure - diffuse scaly erythematous macules with underlying dyspigmentation - Recommend daily broad spectrum sunscreen SPF 30+ to sun-exposed areas, reapply every 2 hours as needed.  - Staying in the shade or wearing long sleeves, sun glasses (UVA+UVB protection) and  wide brim hats (4-inch brim around the entire circumference of the hat) are also recommended for sun protection.  - Call for new or changing lesions.  LENTIGINES, SEBORRHEIC KERATOSES, HEMANGIOMAS - Benign normal skin lesions - Benign-appearing - Call for any changes  MELANOCYTIC NEVI - Tan-brown and/or pink-flesh-colored symmetric macules and papules - Benign appearing on exam today - Observation - Call clinic for new or changing moles - Recommend daily use of broad spectrum spf 30+ sunscreen to sun-exposed areas.   EXCORIATION Exam: Excoriation at right postauricular  Treatment Plan: Recommend vaseline. Call if not resolving.  DERMATOFIBROMA Exam: Firm pink/brown papulenodule with dimple sign at the left shoulder.  Treatment Plan: A dermatofibroma is a benign growth possibly related to trauma, such as an insect bite, cut from shaving, or inflamed acne-type bump.  Treatment options to remove include shave or excision with resulting scar and risk of recurrence.  Since benign-appearing and not bothersome, will observe for now.   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  Observation.  Call clinic for new or changing lesions.  - Discussed ED vs shave removal if becomes bothersome.   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 Condition is symptomatic/ bothersome to patient. Not currently at goal.   Treatment: Restart tretinoin  0.025% cream - apply a pea-sized amount to face  nightly as tolerated dsp 45g 3Rf. Topical retinoid medications like tretinoin /Retin-A , adapalene/Differin, tazarotene/Fabior, and Epiduo/Epiduo Forte can cause dryness and irritation when first started.  Only apply a pea-sized amount to the entire affected area. Avoid applying it around the eyes, edges of mouth and creases at the nose. If you experience irritation, use a good moisturizer first and/or apply the medicine less often. If you are doing well with the medicine, you can increase how often you use it until you are applying every night. Be careful with sun protection while using this medication as it can make you sensitive to the sun. This medicine should not be used by pregnant women.   FACIAL ELASTOSIS Exam: Rhytides and volume loss.  Treatment Plan: Discussed Botox/Fillers. Patient not interested at this time. Start tretinoin  0.025% cream nightly as tolerated.  Recommend daily broad spectrum sunscreen SPF 30+ to sun-exposed areas, reapply every 2 hours as needed. Call for new or changing lesions.  Staying in the shade or wearing long sleeves, sun glasses (UVA+UVB protection) and wide brim hats (4-inch brim around the entire circumference of the hat) are also recommended for sun protection.   HISTORY OF PRECANCEROUS ACTINIC KERATOSIS - site(s) of PreCancerous Actinic Keratosis clear today. - these may recur and new lesions may form requiring treatment to prevent transformation into skin cancer - observe for new or changing spots and contact [facility] for appointment if occur - photoprotection with sun protective clothing; sunglasses and broad spectrum sunscreen with SPF of at least 30 + and frequent self skin exams recommended - yearly exams by a dermatologist recommended for persons with history of PreCancerous Actinic Keratoses  Sebaceous Hyperplasia vs Cyst/Milia - Small yellow papules with a central dell left infraocular - Benign-appearing - Observe. Call for changes.  May apply tretinoin  to area at bedtime as tolerated   Return 1-2 years, for TBSE, Hx AKs.  IAndrea Kerns, CMA, am acting as scribe for Rexene Rattler, MD .   Documentation: I have reviewed the  above documentation for accuracy and completeness, and I agree with the above.  Rexene Rattler, MD

## 2024-06-11 NOTE — Patient Instructions (Addendum)
 Restart tretinoin  0.025% cream - apply a pea-sized amount to face nightly as tolerated. Topical retinoid medications like tretinoin /Retin-A , adapalene/Differin, tazarotene/Fabior, and Epiduo/Epiduo Forte can cause dryness and irritation when first started. Only apply a pea-sized amount to the entire affected area. Avoid applying it around the eyes, edges of mouth and creases at the nose. If you experience irritation, use a good moisturizer first and/or apply the medicine less often. If you are doing well with the medicine, you can increase how often you use it until you are applying every night. Be careful with sun protection while using this medication as it can make you sensitive to the sun. This medicine should not be used by pregnant women.    Recommend taking Heliocare sun protection supplement daily in sunny weather for additional sun protection. For maximum protection on the sunniest days, you can take up to 2 capsules of regular Heliocare OR take 1 capsule of Heliocare Ultra. For prolonged exposure (such as a full day in the sun), you can repeat your dose of the supplement 4 hours after your first dose. Heliocare can be purchased at [employer], at some [facility] or at [email or web address].   Seborrheic Keratosis  What causes seborrheic keratoses? Seborrheic keratoses are harmless, common skin growths that first appear during adult life.  As time goes by, more growths appear.  Some people may develop a large number of them.  Seborrheic keratoses appear on both covered and uncovered body parts.  They are not caused by sunlight.  The tendency to develop seborrheic keratoses can be inherited.  They vary in color from skin-colored to gray, brown, or even black.  They can be either smooth or have a rough, warty surface.   Seborrheic keratoses are superficial and look as if they were stuck on the skin.  Under the microscope this type of keratosis looks like layers upon layers of skin.  That is why  at times the top layer may seem to fall off, but the rest of the growth remains and re-grows.    Treatment Seborrheic keratoses do not need to be treated, but can easily be removed in the office.  Seborrheic keratoses often cause symptoms when they rub on clothing or jewelry.  Lesions can be in the way of shaving.  If they become inflamed, they can cause itching, soreness, or burning.  Removal of a seborrheic keratosis can be accomplished by freezing, burning, or surgery. If any spot bleeds, scabs, or grows rapidly, please return to have it checked, as these can be an indication of a skin cancer.   Melanoma ABCDEs  Melanoma is the most dangerous type of skin cancer, and is the leading cause of death from skin disease.  You are more likely to develop melanoma if you: Have light-colored skin, light-colored eyes, or red or blond hair Spend a lot of time in the sun Tan regularly, either outdoors or in a tanning bed Have had blistering sunburns, especially during childhood Have a close family member who has had a melanoma Have atypical moles or large birthmarks  Early detection of melanoma is key since treatment is typically straightforward and cure rates are extremely high if we catch it early.   The first sign of melanoma is often a change in a mole or a new dark spot.  The ABCDE system is a way of remembering the signs of melanoma.  A for asymmetry:  The two halves do not match. B for border:  The edges of the growth are  irregular. C for color:  A mixture of colors are present instead of an even brown color. D for diameter:  Melanomas are usually (but not always) greater than 6mm - the size of a pencil eraser. E for evolution:  The spot keeps changing in size, shape, and color.  Please check your skin once per month between visits. You can use a small mirror in front and a large mirror behind you to keep an eye on the back side or your body.   If you see any new or changing lesions before  your next follow-up, please call to schedule a visit.  Please continue daily skin protection including broad spectrum sunscreen SPF 30+ to sun-exposed areas, reapplying every 2 hours as needed when you're outdoors.   Staying in the shade or wearing long sleeves, sun glasses (UVA+UVB protection) and wide brim hats (4-inch brim around the entire circumference of the hat) are also recommended for sun protection.     Due to recent changes in healthcare laws, you may see results of your pathology and/or laboratory studies on MyChart before the doctors have had a chance to review them. We understand that in some cases there may be results that are confusing or concerning to you. Please understand that not all results are received at the same time and often the doctors may need to interpret multiple results in order to provide you with the best plan of care or course of treatment. Therefore, we ask that you please give us  2 business days to thoroughly review all your results before contacting the office for clarification. Should we see a critical lab result, you will be contacted sooner.   If You Need Anything After Your Visit  If you have any questions or concerns for your doctor, please call our main line at [phone] and press option 4 to reach your doctor's medical assistant. If no one answers, please leave a voicemail as directed and we will return your call as soon as possible. Messages left after 4 pm will be answered the following business day.   You may also send us  a message via MyChart. We typically respond to MyChart messages within 1-2 business days.  For prescription refills, please ask your pharmacy to contact our office. Our fax number is [phone].  If you have an urgent issue when the clinic is closed that cannot wait until the next business day, you can page your doctor at the number below.    Please note that while we do our best to be available for urgent issues outside of  office hours, we are not available 24/7.   If you have an urgent issue and are unable to reach us , you may choose to seek medical care at your doctor's office, retail clinic, urgent care center, or emergency room.  If you have a medical emergency, please immediately call 911 or go to the emergency department.  Pager Numbers  - Dr. Hester: [phone]  - Dr. Jackquline: [phone]  - Dr. Claudene: [phone]   - Dr. Raymund: [phone]  In the event of inclement weather, please call our main line at [phone] for an update on the status of any delays or closures.  Dermatology Medication Tips: Please keep the boxes that topical medications come in in order to help keep track of the instructions about where and how to use these. Pharmacies typically print the medication instructions only on the boxes and not directly on the medication tubes.   If your medication is too  expensive, please contact our office at [phone] option 4 or send us  a message through MyChart.   We are unable to tell what your co-pay for medications will be in advance as this is different depending on your insurance coverage. However, we may be able to find a substitute medication at lower cost or fill out paperwork to get insurance to cover a needed medication.   If a prior authorization is required to get your medication covered by your insurance company, please allow us  1-2 business days to complete this process.  Drug prices often vary depending on where the prescription is filled and some pharmacies may offer cheaper prices.  The website www.goodrx.com contains coupons for medications through different pharmacies. The prices here do not account for what the cost may be with help from insurance (it may be cheaper with your insurance), but the website can give you the price if you did not use any insurance.  - You can print the associated coupon and take it with your prescription to the pharmacy.  - You may  also stop by our office during regular business hours and pick up a GoodRx coupon card.  - If you need your prescription sent electronically to a different pharmacy, notify our office through [facility] or by phone at [phone] option 4.     Si Usted Necesita Algo Despus de Su Visita  Tambin puede enviarnos un mensaje a travs de Clinical Cytogeneticist. Por lo general respondemos a los mensajes de MyChart en el transcurso de 1 a 2 das hbiles.  Para renovar recetas, por favor pida a su farmacia que se ponga en contacto con nuestra oficina. Randi lakes de fax es [place] [phone].  Si tiene un asunto urgente cuando la clnica est cerrada y que no puede esperar hasta el siguiente da hbil, puede llamar/localizar a su doctor(a) al nmero que aparece a continuacin.   Por favor, tenga en cuenta que aunque hacemos todo lo posible para estar disponibles para asuntos urgentes fuera del horario de [place], no estamos disponibles las 24 horas del da, los 7 [address]  [address] de la [place].   Si tiene un problema urgente y no puede comunicarse con nosotros, puede optar por buscar atencin mdica  en el consultorio de su doctor(a), en una clnica privada, en un centro de atencin urgente o en una sala de emergencias.  Si tiene engineer, drilling, por favor llame inmediatamente al 911 o vaya a la sala de emergencias.  Nmeros de bper  - Dr. Hester: [phone]  - Dra. Jackquline: [phone]  - Dr. Claudene: [phone]  - Dra. Kitts: [phone]  En caso de inclemencias del [place], por favor llame a nuestra lnea principal al [phone] para una actualizacin sobre el estado de cualquier retraso o cierre.  Consejos para la medicacin en dermatologa: Por favor, guarde las cajas en las que vienen los medicamentos de uso tpico para ayudarle a seguir las instrucciones sobre dnde y cmo usarlos. Las farmacias generalmente imprimen las instrucciones del medicamento slo en las cajas y no directamente  en los tubos del [place].   Si su medicamento es muy caro, por favor, pngase en contacto con landry rieger llamando al [phone] y presione la opcin 4 o envenos un mensaje a travs de Clinical Cytogeneticist.   No podemos decirle cul ser su copago por los medicamentos por adelantado ya que esto es diferente dependiendo de la cobertura de su seguro. Sin embargo, es posible que podamos encontrar un medicamento sustituto a therapist, occupational  formulario para que el seguro cubra el medicamento que se considera necesario.   Si se requiere una autorizacin previa para que su compaa de seguros cubra su medicamento, por favor permtanos de 1 a 2 das hbiles para completar este proceso.  Los precios de los medicamentos varan con frecuencia dependiendo del environmental consultant de dnde se surte la receta y alguna farmacias pueden ofrecer precios ms baratos.  El sitio web www.goodrx.com tiene cupones para medicamentos de health and safety inspector. Los precios aqu no tienen en cuenta lo que podra costar con la ayuda del seguro (puede ser ms barato con su seguro), pero el sitio web puede darle el precio si no utiliz tourist information centre manager.  - Puede imprimir el cupn correspondiente y llevarlo con su receta a la farmacia.  - Tambin puede pasar por nuestra oficina durante el horario de atencin regular y education officer, museum una tarjeta de cupones de GoodRx.  - Si necesita que su receta se enve electrnicamente a una farmacia diferente, informe a nuestra oficina a travs de MyChart de [place] o por telfono llamando al [phone] y presione la opcin 4.

## 2024-06-23 ENCOUNTER — Other Ambulatory Visit: Payer: Self-pay | Admitting: Family Medicine

## 2024-06-23 ENCOUNTER — Encounter: Payer: Self-pay | Admitting: Family Medicine

## 2024-06-23 ENCOUNTER — Other Ambulatory Visit (HOSPITAL_COMMUNITY)
Admission: RE | Admit: 2024-06-23 | Discharge: 2024-06-23 | Disposition: A | Discharge status: Home / Self Care | Source: Ambulatory Visit | Attending: Family Medicine | Admitting: Family Medicine

## 2024-06-23 ENCOUNTER — Ambulatory Visit (INDEPENDENT_AMBULATORY_CARE_PROVIDER_SITE_OTHER): Admitting: Family Medicine

## 2024-06-23 VITALS — BP 143/69 | HR 65 | Temp 98.3°F | Ht 68.0 in | Wt 260.3 lb

## 2024-06-23 DIAGNOSIS — E782 Mixed hyperlipidemia: Secondary | ICD-10-CM | POA: Diagnosis not present

## 2024-06-23 DIAGNOSIS — Z1151 Encounter for screening for human papillomavirus (HPV): Secondary | ICD-10-CM | POA: Diagnosis not present

## 2024-06-23 DIAGNOSIS — Z8249 Family history of ischemic heart disease and other diseases of the circulatory system: Secondary | ICD-10-CM

## 2024-06-23 DIAGNOSIS — Z124 Encounter for screening for malignant neoplasm of cervix: Secondary | ICD-10-CM | POA: Diagnosis not present

## 2024-06-23 DIAGNOSIS — Z780 Asymptomatic menopausal state: Secondary | ICD-10-CM | POA: Insufficient documentation

## 2024-06-23 DIAGNOSIS — Z0000 Encounter for general adult medical examination without abnormal findings: Secondary | ICD-10-CM

## 2024-06-23 DIAGNOSIS — N952 Postmenopausal atrophic vaginitis: Secondary | ICD-10-CM

## 2024-06-23 DIAGNOSIS — R350 Frequency of micturition: Secondary | ICD-10-CM | POA: Diagnosis not present

## 2024-06-23 DIAGNOSIS — G479 Sleep disorder, unspecified: Secondary | ICD-10-CM

## 2024-06-23 DIAGNOSIS — I10 Essential (primary) hypertension: Secondary | ICD-10-CM

## 2024-06-23 LAB — POCT URINALYSIS DIPSTICK
Bilirubin, UA: NEGATIVE
Blood, UA: NEGATIVE
Glucose, UA: NEGATIVE
Ketones, UA: NEGATIVE
Leukocytes, UA: NEGATIVE
Nitrite, UA: NEGATIVE
Protein, UA: POSITIVE — AB
Spec Grav, UA: 1.01
Urobilinogen, UA: 0.2 U/dL
pH, UA: 7

## 2024-06-23 NOTE — Progress Notes (Signed)
 "    Complete physical exam   Patient: Rita Foster   DOB: March 07, 1961   63 y.o. Female  MRN: [id number] Visit Date: 06/23/2024  Today's healthcare provider: LAURAINE LOISE BUOY, DO   Chief Complaint  Patient presents with   Annual Exam    Patient is here today for a physical and pap.  States that she urinates a lot always has for years but seems like it is more frequent than usual certain times of the day tends to be worse.  Having sex or not the pap has been painful not just uncomfortable as they normally are.   Subjective    Rita Foster is a 63 y.o. female who presents today for a complete physical exam.   HPI HPI     Annual Exam    Additional comments: Patient is here today for a physical and pap.  States that she urinates a lot always has for years but seems like it is more frequent than usual certain times of the day tends to be worse.  Having sex or not the pap has been painful not just uncomfortable as they normally are.      Last edited by Terrel Powell CROME, CMA on 06/23/2024  8:50 AM.      Rita Foster is a 63 year old female who presents with urinary frequency and tingling in her hands.  She experiences urinary frequency, noting that she can go for three to four hours without urinating, but then suddenly feels an urgent need to urinate within 45 minutes to an hour after leaving work. She drinks a lot of water , which she has done for years, and finds this pattern unusual.  She experiences tingling in her hands every night, which wakes her up. She has found certain positions that alleviate the tingling, but recently had to find new positions as the tingling has persisted over the last week or two.  She describes a sensation in her left ear that has been occurring off and on for at least six months. It feels like something is moving around inside, similar to a heartbeat. This sensation lasts for a few days to a week or two before subsiding and then  returning.  She reports soreness in her mouth since having her crowns replaced. The soreness is present when she presses down with food, but is less bothersome when she chews slowly. The first crown was done over a month ago, and the second one was done about a month ago. The side that was done first has improved, but the other side remains tender.  She has declined COVID and shingles vaccines in the past and continues to decline them. She has not had a colonoscopy yet due to logistical issues. Her last mammogram was reported as BI-RADS 1. She discussed the possibility of stopping Pap smears, as she is almost 64 and does not recall any history of abnormal results.  She has a family history of breast cancer, as her mother had breast cancer, which she attributed to hormone therapy.     Past Medical History:  Diagnosis Date   Allergy childhood   seasonal   Arthritis    Asthma    Basal cell carcinoma 05/10/2022   Left nasal sidewall. Nodular type. Mohs 03/19/2023 [facility] Dr. Gregorio   Cataract 5-6 yrs ago   had surgery on both   Hyperlipidemia    Hypertension    Pre-diabetes    Past Surgical History:  Procedure Laterality Date  BREAST BIOPSY Left 10+ years ago   Negative core   BREAST EXCISIONAL BIOPSY Left    2002   CATARACT EXTRACTION Right 07/2014   CATARACT EXTRACTION Left 2016   EYE SURGERY  5-6 yrs ago   both eyes   HERNIA REPAIR  02/21/2010   KNEE ARTHROPLASTY Right 07/12/2022   Procedure: COMPUTER ASSISTED TOTAL KNEE ARTHROPLASTY;  Surgeon: Mardee Lynwood SQUIBB, MD;  Location: [facility] OR[facility];  Service: Orthopedics;  Laterality: Right;   KNEE ARTHROPLASTY Left 04/30/2023   Procedure: COMPUTER ASSISTED TOTAL KNEE ARTHROPLASTY;  Surgeon: Mardee Lynwood SQUIBB, MD;  Location: [facility] OR[facility];  Service: Orthopedics;  Laterality: Left;   SMALL INTESTINE SURGERY  ? hernia surgery   removed part (small?)   TONSILLECTOMY     TONSILLECTOMY AND ADENOIDECTOMY  1968   Social History   Socioeconomic  History   Marital status: Divorced    Spouse name: Not on file   Number of children: Not on file   Years of education: Not on file   Highest education level: Not on file  Occupational History   Not on file  Tobacco Use   Smoking status: Former    Current packs/day: 0.00    Types: Cigarettes    Start date: 07/02/1994    Quit date: 07/03/1999    Years since quitting: 25.0   Smokeless tobacco: Never  Vaping Use   Vaping status: Never Used  Substance and Sexual Activity   Alcohol use: No   Drug use: No   Sexual activity: Not on file  Other Topics Concern   Not on file  Social History Narrative   Not on file   Social Drivers of Health   Tobacco Use: Medium Risk (06/23/2024)   Patient History    Smoking Tobacco Use: Former    Smokeless Tobacco Use: Never    Passive Exposure: Not on Actuary Strain: Not on file  Food Insecurity: No Food Insecurity (04/30/2023)   Hunger Vital Sign    Worried About Running Out of Food in the Last Year: Never true    Ran Out of Food in the Last Year: Never true  Transportation Needs: No Transportation Needs (04/30/2023)   PRAPARE - Administrator, Civil Service (Medical): No    Lack of Transportation (Non-Medical): No  Physical Activity: Not on file  Stress: Not on file  Social Connections: Not on file  Intimate Partner Violence: Not At Risk (04/30/2023)   Humiliation, Afraid, Rape, and Kick questionnaire    Fear of Current or Ex-Partner: No    Emotionally Abused: No    Physically Abused: No    Sexually Abused: No  Depression (PHQ2-9): Low Risk (06/23/2024)   Depression (PHQ2-9)    PHQ-2 Score: 2  Alcohol Screen: Low Risk (01/04/2022)   Alcohol Screen    Last Alcohol Screening Score (AUDIT): 0  Housing: Unknown (07/24/2023)   Received from [facility]   Epic    At any time in the past 12 months, were you homeless or living in a shelter (including now)?: No    Number of Times Moved in the  Last Year: Not on file    Unable to Pay for Housing in the Last Year: Not on file  Utilities: Not At Risk (04/30/2023)   [facility] Utilities    Threatened with loss of utilities: No  Health Literacy: Not on file   Family Status  Relation Name Status   Mother Rita Foster Deceased   Father  Lorence Margo Alive   MGM  Deceased at age 44       natural causes   MGF  Deceased       artherosclerosis   PGM  Deceased       old age   PGF  Deceased       unknown causes  No partnership data on file   Family History  Problem Relation Age of Onset   Crohn's disease Mother    Thyroid disease Mother    Rheum arthritis Mother    Breast cancer Mother 13   Arthritis Mother    Hypertension Father    Skin cancer Father    Heart disease Father    Allergies[1]  Patient Care Team: Vinal Rosengrant, Lauraine SAILOR, DO as PCP - General (Family Medicine) Argentina Clap, MD as PCP - Cardiology (Cardiology)   Medications: Show/hide medication list[2]  Review of Systems  Constitutional:  Negative for chills, fatigue and fever.  HENT:  Negative for congestion, ear pain, rhinorrhea, sneezing and sore throat.   Eyes: Negative.  Negative for pain and redness.  Respiratory:  Negative for cough, shortness of breath and wheezing.   Cardiovascular:  Negative for chest pain and leg swelling.  Gastrointestinal:  Negative for abdominal pain, blood in stool, constipation, diarrhea and nausea.  Endocrine: Negative for polydipsia and polyphagia.  Genitourinary:  Positive for frequency. Negative for dysuria, flank pain, hematuria, pelvic pain, vaginal bleeding and vaginal discharge.  Musculoskeletal:  Negative for arthralgias, back pain, gait problem and joint swelling.  Skin:  Negative for rash.  Neurological:  Positive for numbness (tingling in hands nightly). Negative for dizziness, tremors, seizures, weakness, light-headedness and headaches.  Hematological:  Negative for adenopathy.  Psychiatric/Behavioral: Negative.   Negative for behavioral problems, confusion and dysphoric mood. The patient is not nervous/anxious and is not hyperactive.       Objective    BP (!) 143/69 (BP Location: Left Arm, Patient Position: Sitting, Cuff Size: Large)   Pulse 65   Temp 98.3 F (36.8 C) (Oral)   Ht 5' 8 (1.727 m)   Wt 260 lb 4.8 oz (118.1 kg)   SpO2 100%   BMI 39.58 kg/m    Physical Exam Vitals and nursing note reviewed.  Constitutional:      General: She is awake.     Appearance: Normal appearance. She is morbidly obese.  HENT:     Head: Normocephalic and atraumatic.     Right Ear: Tympanic membrane, ear canal and external ear normal.     Left Ear: Tympanic membrane, ear canal and external ear normal.     Nose: Nose normal.     Mouth/Throat:     Mouth: Mucous membranes are moist.     Pharynx: Oropharynx is clear. No oropharyngeal exudate or posterior oropharyngeal erythema.  Eyes:     General: No scleral icterus.    Extraocular Movements: Extraocular movements intact.     Conjunctiva/sclera: Conjunctivae normal.     Pupils: Pupils are equal, round, and reactive to light.  Neck:     Thyroid: No thyromegaly or thyroid tenderness.  Cardiovascular:     Rate and Rhythm: Normal rate and regular rhythm.     Pulses: Normal pulses.     Heart sounds: Normal heart sounds.  Pulmonary:     Effort: Pulmonary effort is normal. No tachypnea, bradypnea or respiratory distress.     Breath sounds: Normal breath sounds. No stridor. No wheezing, rhonchi or rales.  Abdominal:     General:  Bowel sounds are normal. There is no distension.     Palpations: Abdomen is soft. There is no mass.     Tenderness: There is no abdominal tenderness. There is no guarding.     Hernia: No hernia is present.  Genitourinary:    Exam position: Lithotomy position.     Pubic Area: No rash.      Tanner stage (genital): 5.     Labia:        Right: No rash, tenderness, lesion or injury.        Left: No rash, tenderness, lesion or  injury.      Vagina: No signs of injury and foreign body. Erythema and bleeding (friable) present. No vaginal discharge, tenderness, lesions or prolapsed vaginal walls.     Cervix: No cervical motion tenderness, discharge, friability, lesion, erythema, cervical bleeding or eversion.     Comments: Vaginal atrophy noted on exam. Musculoskeletal:     Cervical back: Normal range of motion and neck supple.     Right lower leg: No edema.     Left lower leg: No edema.  Lymphadenopathy:     Cervical: No cervical adenopathy.  Skin:    General: Skin is warm and dry.  Neurological:     Mental Status: She is alert and oriented to person, place, and time. Mental status is at baseline.  Psychiatric:        Mood and Affect: Mood normal.        Behavior: Behavior normal.      Last depression screening scores    06/23/2024    8:55 AM 02/18/2024    9:04 AM 10/15/2023    8:39 AM  PHQ 2/9 Scores  PHQ - 2 Score 0 0 0  PHQ- 9 Score 2 1  3       Data saved with a previous flowsheet row definition   Last fall risk screening    06/23/2024    8:55 AM  Fall Risk   Falls in the past year? 0  Number falls in past yr: 0  Injury with Fall? 0   Last Audit-C alcohol use screening    01/04/2022    9:59 AM  Alcohol Use Disorder Test (AUDIT)  1. How often do you have a drink containing alcohol? 0  2. How many drinks containing alcohol do you have on a typical day when you are drinking? 0  3. How often do you have six or more drinks on one occasion? 0  AUDIT-C Score 0   A score of 3 or more in women, and 4 or more in men indicates increased risk for alcohol abuse, EXCEPT if all of the points are from question 1   Results for orders placed or performed in visit on 06/23/24  POCT Urinalysis Dipstick  Result Value Ref Range   Color, UA Light Yellow    Clarity, UA Clear    Glucose, UA Negative Negative   Bilirubin, UA Negative    Ketones, UA Negative    Spec Grav, UA 1.010 1.010 - 1.025   Blood, UA  Negative    pH, UA 7.0 5.0 - 8.0   Protein, UA Positive (A) Negative   Urobilinogen, UA 0.2 0.2 or 1.0 E.U./dL   Nitrite, UA Negative    Leukocytes, UA Negative Negative   Appearance     Odor    Cytology - PAP  Result Value Ref Range   High risk HPV Negative    Adequacy  Satisfactory for evaluation. The presence or absence of an   Adequacy      endocervical/transformation zone component cannot be determined because   Adequacy of atrophy.    Diagnosis      - Negative for intraepithelial lesion or malignancy (NILM)   Comment Normal Reference Range HPV - Negative     Assessment & Plan    Routine Health Maintenance and Physical Exam  Exercise Activities and Dietary recommendations  Goals   None     Immunization History  Administered Date(s) Administered   Influenza Inj Mdck Quad Pf 04/23/2017   Influenza, Seasonal, Injecte, Preservative Fre 03/14/2023, 03/27/2024   Influenza,inj,Quad PF,6+ Mos 03/27/2019, 06/01/2021, 05/03/2022   Influenza-Unspecified 04/02/2020   Pneumococcal Polysaccharide-23 05/03/2005   Td 08/04/2002, 05/24/2020   Tdap 01/27/2010    Health Maintenance  Topic Date Due   Colonoscopy  08/30/2023   COVID-19 Vaccine (1) 07/09/2024 (Originally 11/24/1961)   Zoster Vaccines- Shingrix (1 of 2) 09/21/2024 (Originally 05/28/1979)   Pneumococcal Vaccine: 50+ Years (2 of 2 - PCV) 02/17/2025 (Originally 05/27/2010)   Mammogram  03/14/2025   Cervical Cancer Screening (HPV/Pap Cotest)  06/23/2029   DTaP/Tdap/Td (4 - Td or Tdap) 05/24/2030   Influenza Vaccine  Completed   Hepatitis C Screening  Completed   HIV Screening  Completed   Hepatitis B Vaccines 19-59 Average Risk  Aged Out   HPV VACCINES  Aged Out   Meningococcal B Vaccine  Aged Out    Discussed health benefits of physical activity, and encouraged her to engage in regular exercise appropriate for her age and condition.   Annual physical exam  Pap smear for cervical cancer screening -      Cytology - PAP  Mixed hyperlipidemia -     Lipid panel  Family history of early CAD -     Lipid panel  Urinary frequency -     POCT urinalysis dipstick  Postmenopausal atrophic vaginitis  Primary hypertension       Annual physical exam; Pap smear for cervical cancer screening Physical exam overall unremarkable except as noted above. Routine lab work ordered as noted. Routine visit with recent normal mammogram (BI-RADS 1). Discussed breast self-exams, mammograms, and potential cessation of Pap smears at age 66 if normal.  Will avoid hormone therapy due to risks.  Patient requests future Pap smears to be approached gently due to previous negative experiences, particularly given her current atrophic vaginitis. - Continue routine mammograms. - Consider cessation of Pap smears at age 59 if results remain normal.  Mixed hyperlipidemia; family history of early CAD LDL well-controlled with rosuvastatin  40 mg. No changes needed.  Recheck lipid panel today. - Continue rosuvastatin  40 mg daily.  Urinary frequency Intermittent frequency and urgency, especially in the afternoon. No burning, fever, or chills. Urine sample collected. - Point-of-care urinalysis negative for infection - May need to consider atrophic vaginitis as contributor to urinary frequency.  Postmenopausal atrophic vaginitis Vaginal atrophy noted. No current sexual activity, so estrogen cream not immediately needed. Discussed potential use if symptoms worsen. - Consider estrogen cream if symptoms become bothersome.  Primary hypertension Chronic, managed with losartan  100 mg daily and amlodipine  10 mg daily.  Blood pressure somewhat elevated today but with recent lower readings in the last several months.   - Will defer addition of a third antihypertensive medication until reassessment at follow-up visit. - Counseled patient on lifestyle modifications.    Return in about 6 months (around 12/22/2024) for HTN, Chronic  f/u.  I discussed the assessment and treatment plan with the patient  The patient was provided an opportunity to ask questions and all were answered. The patient agreed with the plan and demonstrated an understanding of the instructions.   The patient was advised to call back or seek an in-person evaluation if the symptoms worsen or if the condition fails to improve as anticipated.    LAURAINE LOISE BUOY, DO  [facility] [phone] (phone) [phone] (fax)  [facility]     [1]  Allergies Allergen Reactions   Gabapentin  Other (See Comments)    dizziness   Mobic [Meloxicam] Other (See Comments)    Abdominal pain   Penicillins     IgE = 89 (WNL) on 06/30/2022  As a child-unsure of reaction  [2]  Outpatient Medications Prior to Visit  Medication Sig   acyclovir  (ZOVIRAX ) 400 MG tablet Take 1 tablet (400 mg total) by mouth every morning.   amLODipine  (NORVASC ) 10 MG tablet Take 1 tablet (10 mg total) by mouth every morning. TAKE 1 TABLET(10 MG) BY MOUTH DAILY   amoxicillin (AMOXIL) 500 MG capsule Take 2,000 mg by mouth daily. (Patient taking differently: Take 2,000 mg by mouth daily. Takes PRN - prior to dental/surgery.)   aspirin  EC 81 MG tablet Take 81 mg by mouth daily. Swallow whole.   Blood Pressure Monitoring (BLOOD PRESSURE MONITOR/WRIST) DEVI To check bp daily   diclofenac  Sodium (VOLTAREN ) 1 % GEL Apply 2 g topically in the morning and at bedtime.   Glucosamine-Chondroitin (OSTEO BI-FLEX REGULAR STRENGTH PO) Take 1 capsule by mouth daily.   losartan  (COZAAR ) 100 MG tablet Take 1 tablet (100 mg total) by mouth every morning.   Multiple Vitamin (MULTIVITAMIN WITH MINERALS) TABS tablet Take 1 tablet by mouth daily.   Omega-3 Fatty Acids (FISH OIL) 1000 MG CAPS Take 1 capsule by mouth daily.   rosuvastatin  (CRESTOR ) 40 MG tablet Take 1 tablet (40 mg total) by mouth daily.   tretinoin  (RETIN-A ) 0.025 % cream Apply a pea-sized amount  to face nightly as tolerated.   [DISCONTINUED] traZODone  (DESYREL ) 50 MG tablet Take 1 tablet (50 mg total) by mouth at bedtime as needed for sleep.   No facility-administered medications prior to visit.   "

## 2024-06-24 LAB — CYTOLOGY - PAP
Comment: NEGATIVE
Diagnosis: NEGATIVE
High risk HPV: NEGATIVE

## 2024-06-24 NOTE — Telephone Encounter (Signed)
 LOV- 06/23/2024 NOV- 12/22/2024 [facility]- 08/27/2023 Outpatient Medication Detail   Disp Refills Start End   traZODone  (DESYREL ) 50 MG tablet 90 tablet 2 08/27/2023 --   Sig - Route: Take 1 tablet (50 mg total) by mouth at bedtime as needed for sleep. - Oral   Sent to pharmacy as: traZODone  (DESYREL ) 50 MG tablet   E-Prescribing Status: Receipt confirmed by pharmacy (08/27/2023  7:25 AM EST)

## 2024-06-25 ENCOUNTER — Ambulatory Visit: Payer: Self-pay | Admitting: Family Medicine

## 2024-12-22 ENCOUNTER — Encounter

## 2025-06-15 ENCOUNTER — Encounter: Admitting: Dermatology
